# Patient Record
Sex: Male | Born: 1965
Health system: Southern US, Community
[De-identification: ages and names within clinical notes are randomized; demographics above are authoritative.]

## PROBLEM LIST (undated history)

## (undated) DIAGNOSIS — Z87891 Personal history of nicotine dependence: Secondary | ICD-10-CM

## (undated) DIAGNOSIS — I420 Dilated cardiomyopathy: Secondary | ICD-10-CM

## (undated) DIAGNOSIS — I509 Heart failure, unspecified: Secondary | ICD-10-CM

## (undated) DIAGNOSIS — M109 Gout, unspecified: Secondary | ICD-10-CM

## (undated) DIAGNOSIS — I10 Essential (primary) hypertension: Secondary | ICD-10-CM

## (undated) DIAGNOSIS — Z789 Other specified health status: Secondary | ICD-10-CM

## (undated) DIAGNOSIS — F1090 Alcohol use, unspecified, uncomplicated: Secondary | ICD-10-CM

## (undated) DIAGNOSIS — N1831 Chronic kidney disease, stage 3a: Secondary | ICD-10-CM

## (undated) HISTORY — DX: Heart failure, unspecified: I50.9

## (undated) HISTORY — DX: Gout, unspecified: M10.9

## (undated) HISTORY — DX: Essential (primary) hypertension: I10

---

## 2000-12-18 ENCOUNTER — Emergency Department (HOSPITAL_COMMUNITY): Admission: EM | Admit: 2000-12-18 | Discharge: 2000-12-18 | Payer: Self-pay | Admitting: Emergency Medicine

## 2001-12-12 ENCOUNTER — Encounter: Payer: Self-pay | Admitting: Family Medicine

## 2001-12-12 ENCOUNTER — Ambulatory Visit (HOSPITAL_COMMUNITY): Admission: RE | Admit: 2001-12-12 | Discharge: 2001-12-12 | Payer: Self-pay | Admitting: Family Medicine

## 2002-05-17 ENCOUNTER — Encounter: Payer: Self-pay | Admitting: *Deleted

## 2002-05-17 ENCOUNTER — Emergency Department (HOSPITAL_COMMUNITY): Admission: EM | Admit: 2002-05-17 | Discharge: 2002-05-17 | Payer: Self-pay | Admitting: Emergency Medicine

## 2007-05-10 ENCOUNTER — Emergency Department (HOSPITAL_COMMUNITY): Admission: EM | Admit: 2007-05-10 | Discharge: 2007-05-10 | Payer: Self-pay | Admitting: Emergency Medicine

## 2011-02-06 ENCOUNTER — Other Ambulatory Visit: Payer: Self-pay | Admitting: Family Medicine

## 2011-02-06 ENCOUNTER — Other Ambulatory Visit: Payer: Self-pay

## 2011-02-06 DIAGNOSIS — R059 Cough, unspecified: Secondary | ICD-10-CM

## 2011-02-06 DIAGNOSIS — R05 Cough: Secondary | ICD-10-CM

## 2011-02-27 ENCOUNTER — Inpatient Hospital Stay (HOSPITAL_COMMUNITY)
Admission: RE | Admit: 2011-02-27 | Discharge: 2011-03-01 | DRG: 124 | Disposition: A | Discharge status: Home / Self Care | Payer: BC Managed Care – PPO | Source: Ambulatory Visit | Attending: Cardiology | Admitting: Cardiology

## 2011-02-27 DIAGNOSIS — I10 Essential (primary) hypertension: Secondary | ICD-10-CM | POA: Diagnosis present

## 2011-02-27 DIAGNOSIS — I5043 Acute on chronic combined systolic (congestive) and diastolic (congestive) heart failure: Principal | ICD-10-CM | POA: Diagnosis present

## 2011-02-27 DIAGNOSIS — I2789 Other specified pulmonary heart diseases: Secondary | ICD-10-CM | POA: Diagnosis present

## 2011-02-27 DIAGNOSIS — R000 Tachycardia, unspecified: Secondary | ICD-10-CM | POA: Diagnosis present

## 2011-02-27 DIAGNOSIS — I509 Heart failure, unspecified: Secondary | ICD-10-CM | POA: Diagnosis present

## 2011-02-27 DIAGNOSIS — F17200 Nicotine dependence, unspecified, uncomplicated: Secondary | ICD-10-CM | POA: Diagnosis present

## 2011-02-27 DIAGNOSIS — I428 Other cardiomyopathies: Secondary | ICD-10-CM | POA: Diagnosis present

## 2011-02-27 LAB — POCT I-STAT 3, ART BLOOD GAS (G3+)
Acid-base deficit: 1 mmol/L (ref 0.0–2.0)
Bicarbonate: 22.5 mEq/L (ref 20.0–24.0)
O2 Saturation: 95 %
TCO2: 24 mmol/L (ref 0–100)
pCO2 arterial: 35.1 mmHg (ref 35.0–45.0)
pH, Arterial: 7.414 (ref 7.350–7.450)
pO2, Arterial: 76 mmHg — ABNORMAL LOW (ref 80.0–100.0)

## 2011-02-27 LAB — POCT I-STAT 3, VENOUS BLOOD GAS (G3P V)
O2 Saturation: 65 %
TCO2: 27 mmol/L (ref 0–100)
pCO2, Ven: 43.3 mmHg — ABNORMAL LOW (ref 45.0–50.0)
pO2, Ven: 35 mmHg (ref 30.0–45.0)

## 2011-02-28 LAB — CBC
MCH: 30.7 pg (ref 26.0–34.0)
MCHC: 33.6 g/dL (ref 30.0–36.0)
MCV: 91.4 fL (ref 78.0–100.0)
Platelets: 188 10*3/uL (ref 150–400)
RDW: 15.4 % (ref 11.5–15.5)
WBC: 8.7 10*3/uL (ref 4.0–10.5)

## 2011-02-28 LAB — BASIC METABOLIC PANEL
BUN: 16 mg/dL (ref 6–23)
Calcium: 9 mg/dL (ref 8.4–10.5)
Creatinine, Ser: 1.38 mg/dL (ref 0.4–1.5)
GFR calc non Af Amer: 55 mL/min — ABNORMAL LOW (ref >60)

## 2011-02-28 LAB — BRAIN NATRIURETIC PEPTIDE: Pro B Natriuretic peptide (BNP): 1100 pg/mL — ABNORMAL HIGH (ref 0.0–100.0)

## 2011-03-01 LAB — BASIC METABOLIC PANEL
BUN: 16 mg/dL (ref 6–23)
Creatinine, Ser: 1.22 mg/dL (ref 0.4–1.5)
GFR calc non Af Amer: >60 mL/min (ref >60)

## 2011-03-01 LAB — BRAIN NATRIURETIC PEPTIDE: Pro B Natriuretic peptide (BNP): 740 pg/mL — ABNORMAL HIGH (ref 0.0–100.0)

## 2011-03-12 NOTE — Procedures (Signed)
Christopher [name], Christopher [name]                ACCOUNT NO.:  [id number]  MEDICAL RECORD NO.:  [id number]           PATIENT TYPE:  I  LOCATION:  3703                         FACILITY:  [facility]  PHYSICIAN:  Christopher Kotyk R. Jacinto Halim, MD       DATE OF BIRTH:  May 05, 1966  DATE OF PROCEDURE:  02/27/2011 DATE OF DISCHARGE:                           CARDIAC CATHETERIZATION   PROCEDURE PERFORMED: 1. Right heart catheterization. 2. Left heart catheterization including:     a.     Left ventriculography.     b.     Selective right and left coronary angiography.  INDICATIONS:  Christopher [name] is a 45 year old gentleman with history of hypertension, morbid obesity, history of questionable alcohol excessive use, who presents with shortness of breath.  He underwent outpatient stress testing and echocardiographic examination which had revealed restrictive physiology, dilated cardiomyopathy, and no evidence of ischemia with severe LV systolic dysfunction.  Now, he is brought to the cardiac cath lab to evaluate his coronary anatomy.  Right heart catheterization was performed to evaluate for pulmonary hypertension and evaluate his cardiac output and cardiac index.  HEMODYNAMIC DATA:  A.  Right heart catheterization: 1. RA pressure 28/27, mean 25 mmHg. 2. RV pressure 71/20 with end-diastolic pressure of 26 mmHg. 3. PA pressure 75/42 with a mean of 50 mmHg, pulmonary capillary wedge     pressure 48/58 with a mean of 46 mmHg. 4. PA saturation 65% and aortic saturation was 95%. 5. Cardiac output by Fick was 5.44 with a cardiac index of 2.2.     a.     Left heart catheterization:  Left ventricular pressure was      113/28 with an end-diastolic pressure of 42 mmHg.  Aortic pressure      was 111/92 with a mean of 191 msec.  There was no pressure      gradient across the aortic valve.  ANGIOGRAPHIC DATA: 1. Left ventricle:  Left ventricular systolic function was markedly     depressed with ejection fraction of 15% to 20%.  The  left ventricle     was markedly dilated. 2. Right coronary artery:  Right coronary artery was a dominant     vessel.  It is smooth and has got mild luminal irregularity. 3. Left main coronary artery:  Left main coronary artery was a large-     caliber vessel, smooth and normal. 4. Circumflex:  Circumflex was a very large-caliber vessel measuring     at least 6 to 6.5 mm in diameter.  Gives origin to large obtuse     marginals.  There was mild luminal irregularity. 5. LAD:  LAD is a large-caliber vessel giving origin to a large     diagonal-1.  It has got mild luminal irregularity.  IMPRESSION: 1. Findings consistent with nonischemic dilated cardiomyopathy. 2. Severe pulmonary hypertension not reversed with total of 800 mcg of     intrapulmonary arterial administration of nitroglycerin.  RECOMMENDATIONS:  The patient will be admitted to the hospital.  Given the fact that the EDP is extremely high, I suspect due to contrast load he could potentially go into  pulmonary edema.  Diurese him over a period of next 24-48 hours and discharge him at that time.  Continued risk modification including total cessation of alcohol intake, smoking cessation, fluid and salt restriction is indicated.  Weight loss is paramount.  A total of 35 mL of contrast was utilized for diagnostic angiography.  TECHNIQUE OF PROCEDURE:  Under sterile precautions using a 6-French right radial access and a 5-French right antecubital vein access, left and right heart catheterization was performed.  Left heart catheterization was performed using a TIG-4 catheter which was advanced into the left ventricle over a J-wire.  Left ventriculography was performed in the RAO projection using 10 mL of contrast.  The catheter then pulled into the ascending aorta.  Left main coronary was selectively engaged, and angiography was performed.  Then, the right coronary artery was selectively engaged and angiography was performed.   Catheter then pulled out of the body over exchange length J- wire.  Right heart catheterization was performed using a 5-French Burman balloon-tipped Swan-Ganz catheter which was advanced into the pulmonary artery and into the pulmonary capillary wedge position.  Right-sided hemodynamics were carefully performed and data was carefully analyzed. Cardiac output as calculated by Fick.  The catheter was then pulled out of the body in the usual fashion.  The patient tolerated the procedure well.  Hemostasis was obtained by applying TR band at the right radial artery site and manual pressure at the antecubital vein access site.  No immediate complication noted.     Christopher Hilts. Jacinto Halim, MD     JRG/MEDQ  D:  02/27/2011  T:  02/28/2011  Job:  [id number]  cc:   Christopher [name], M.D.  Electronically Signed by Christopher Decamp MD on 03/12/2011 09:51:52 AM

## 2011-03-12 NOTE — Discharge Summary (Signed)
  [name], Christopher [name]                ACCOUNT NO.:  [id number]  MEDICAL RECORD NO.:  [id number]           PATIENT TYPE:  I  LOCATION:  3703                         FACILITY:  [facility]  PHYSICIAN:  Vonna Kotyk R. Jacinto Halim, MD       DATE OF BIRTH:  1966-11-27  DATE OF ADMISSION:  02/27/2011 DATE OF DISCHARGE:  03/01/2011                              DISCHARGE SUMMARY   DISCHARGE DIAGNOSES: 1. Acute on chronic systolic and diastolic heart failure. 2. Nonischemic dilated cardiomyopathy. 3. Severe pulmonary hypertension. 4. Systemic hypertension. 5. Morbid obesity.  DISCHARGE MEDICATIONS: 1. Diovan/HCT 160/12.5 half tablet p.o. daily. 2. Multivitamin p.o. daily. 3. Nexium 40 mg p.o. daily. 4. Digoxin 0.125 mg p.o. daily. 5. Coreg 3.125 mg p.o. daily. 6. Lasix 20 mg p.o. daily. 7. K-Dur 20 mEq p.o. daily. 8. The patient is on sleeping pill at bedtime p.r.n., that will be     continued.  BRIEF HISTORY:  Mr. Christopher [name] is a 44-year gentleman with history of hypertension, morbid obesity, and questionable alcohol excessive use who was seen for shortness of breath.  He underwent outpatient echocardiogram and stress testing which had revealed presence of severe LV systolic dysfunction.  He was then electively brought for cardiac catheterization on February 27, 2011.  Left and right heart catheterization was performed.  PROCEDURAL DATA:  His ejection fraction was severely depressed with ejection fraction around 15% to 20% with dilated left ventricle.  He had mild luminal irregularity in his coronary arteries without any significant coronary artery disease.  He had severe pulmonary hypertension with PA pressure of 75/42 mmHg with a mean of 50 mmHg.  His pulmonary capillary wedge was markedly elevated with a mean of 46 mmHg.  His cardiac output and cardiac index were preserved.  COURSE IN THE HOSPITAL:  Because of his markedly elevated pulmonary hypertension and also wedge pressures, the fact that  he has also received contrast, suspicion for acute worsening of heart failure was suspected.  Hence he was admitted after the cardiac catheterization for diuresis.  With IV Lasix, he diuresed significantly.  He had a total loss of 3600 mL of fluid.  His BNP improved from 1100 yesterday to discharge of 740. He had tachycardia on admission, which also resolved.  Hence, on the day of discharge, he was felt to be stable for discharge.  Again, I have discussed with the patient regarding smoking cessation and alcohol abstinence.  I will see him back in the office in 1 week to 10 days.  NEW MEDICATIONS: 1. Digoxin 0.25 mg p.o. daily. 2. Lasix 20 mg p.o. daily. 3. Potassium 20 mEq p.o. daily. 4. His Diovan and Coreg dose has been decreased.     Cristy Hilts. Jacinto Halim, MD     JRG/MEDQ  D:  03/01/2011  T:  03/02/2011  Job:  [id number]  cc:   Thora Lance, M.D.  Electronically Signed by Yates Decamp MD on 03/12/2011 09:51:44 AM

## 2017-05-28 ENCOUNTER — Encounter: Payer: Self-pay | Admitting: Neurology

## 2017-05-28 DIAGNOSIS — M79659 Pain in unspecified thigh: Secondary | ICD-10-CM | POA: Diagnosis not present

## 2017-05-29 ENCOUNTER — Other Ambulatory Visit: Payer: Self-pay | Admitting: *Deleted

## 2017-05-29 DIAGNOSIS — M79651 Pain in right thigh: Secondary | ICD-10-CM

## 2017-06-13 ENCOUNTER — Ambulatory Visit (INDEPENDENT_AMBULATORY_CARE_PROVIDER_SITE_OTHER): Payer: 59 | Admitting: Neurology

## 2017-06-13 DIAGNOSIS — G5711 Meralgia paresthetica, right lower limb: Secondary | ICD-10-CM

## 2017-06-13 DIAGNOSIS — M79651 Pain in right thigh: Secondary | ICD-10-CM | POA: Diagnosis not present

## 2017-06-13 NOTE — Procedures (Signed)
[facility]  [address], [address]  [place], [place] [address] Tel: [phone] Fax:  [phone] Test Date:  06/13/2017  Patient: Christopher ArmsCraig Cercone DOB: [birth year] Physician: Nita Sickleonika Patel, DO  Sex: Male Height: 6' " Ref Phys: Georgann HousekeeperKarrar Husain, MD  ID#: [id number] Temp: 38.1C Technician:    Patient Complaints: This is a 51 year-old man referred for evaluation of right thigh pain and paresthesias.  NCV & EMG Findings: Extensive electrodiagnostic testing of the right lower extremity and additional studies of the left is somewhat hampered by technical limitations due to body physiognomy. Findings are as follows:  1. Nerve conduction studies of the lateral cutaneous sensory response is technically challenging and absent bilaterally. Right sural and superficial peroneal sensory responses are within normal limits. 2. Right tibial motor response at the ankle is within normal limits, however, stimulation at the popliteal fossa was limited due to body habitus.  3. There is no evidence of active or chronic motor axon loss changes affecting any of the tested muscles.  Impression: Given that the lateral femoral cutaneous sensory responses are absent bilaterally and patient is only symptomatic on the right side, these findings may be due to technical limitations due to body physiognomy; with that said, based on patient's clinical presentation, meralgia paresthetica is highly suspected. Correlate clinically.  There is no evidence of a lumbosacral radiculopathy or sensorimotor polyneuropathy affecting the right lower extremity.    ___________________________ Nita Sickleonika Patel, DO    Nerve Conduction Studies Anti Sensory Summary Table   Site NR Peak (ms) Norm Peak (ms) P-T Amp (V) Norm P-T Amp  Left Lat Femoral Cutan Anti Sensory (Lateral Thigh)  ASIS NR  <3.1  >10  Right Lat Femoral Cutan Anti Sensory (Lateral Thigh)  ASIS NR  <3.1  >10  Right Sup Peroneal Anti Sensory (Ant Lat Mall)    12 cm    2.9 <4.6 7.4 >4  Right Sural Anti Sensory (Lat Mall)  Calf    3.4 <4.6 7.4 >4   Motor Summary Table   Site NR Onset (ms) Norm Onset (ms) O-P Amp (mV) Norm O-P Amp Site1 Site2 Delta-0 (ms) Dist (cm) Vel (m/s) Norm Vel (m/s)  Right Peroneal Motor (Ext Dig Brev)  Ankle    2.5 <6.0 9.1 >2.5 B Fib Ankle 7.4 35.0 47 >40  B Fib    9.9  8.5  Poplt B Fib 1.5 10.0 67 >40  Poplt    11.4  7.9         Right Tibial Motor (Abd Hall Brev)    body habitus behind knee  Ankle    4.6 <6.0 5.3 >4 Knee Ankle 4.4 37.0 84 >40  Knee    9.0  1.0          EMG   Side Muscle Ins Act Fibs Psw Fasc Number Recrt Dur Dur. Amp Amp. Poly Poly. Comment  Right AntTibialis Nml Nml Nml Nml Nml Nml Nml Nml Nml Nml Nml Nml N/A  Right Gastroc Nml Nml Nml Nml Nml Nml Nml Nml Nml Nml Nml Nml N/A  Right RectFemoris Nml Nml Nml Nml Nml Nml Nml Nml Nml Nml Nml Nml N/A  Right VastusLat Nml Nml Nml Nml Nml Nml Nml Nml Nml Nml Nml Nml N/A  Right GluteusMed Nml Nml Nml Nml Nml Nml Nml Nml Nml Nml Nml Nml N/A      Waveforms:

## 2017-07-31 MED FILL — ALLOPURINOL 100 MG TABLET: 100 | 60 days supply | Qty: 180 | Fill #0

## 2017-07-31 MED FILL — VALSARTAN-HCTZ 320-12.5 MG: 320-12.5 | 30 days supply | Qty: 30 | Fill #0

## 2017-07-31 MED FILL — BYSTOLIC 10 MG TABLET: 10 | 60 days supply | Qty: 120 | Fill #0

## 2017-08-26 MED FILL — VALSARTAN-HCTZ 320-12.5 MG: 320-12.5 | 30 days supply | Qty: 30 | Fill #1

## 2017-08-26 MED FILL — SPIRONOLACTONE 25 MG TABLET: 25 | 60 days supply | Qty: 60 | Fill #0

## 2017-08-29 MED FILL — SILDENAFIL 20 MG TABLET: 20 | 10 days supply | Qty: 50 | Fill #0

## 2017-09-25 MED FILL — ALLOPURINOL 300 MG TABLET: 300 | 90 days supply | Qty: 90 | Fill #0

## 2017-09-25 MED FILL — VALSARTAN-HCTZ 320-12.5 MG: 320-12.5 | 30 days supply | Qty: 30 | Fill #2

## 2017-09-25 MED FILL — BYSTOLIC 10 MG TABLET: 10 | 90 days supply | Qty: 180 | Fill #0

## 2017-10-18 MED FILL — SPIRONOLACTONE 25 MG TABLET: 25 | 90 days supply | Qty: 90 | Fill #0

## 2017-10-21 MED FILL — VALSARTAN-HCTZ 320-12.5 MG: 320-12.5 | 90 days supply | Qty: 90 | Fill #0

## 2017-10-21 MED FILL — SILDENAFIL 20 MG TABLET: 20 | 10 days supply | Qty: 50 | Fill #1

## 2017-12-30 MED FILL — ALLOPURINOL 300 MG TABLET: 300 | 90 days supply | Qty: 90 | Fill #1

## 2017-12-30 MED FILL — BYSTOLIC 20 MG TABLET: 20 | 90 days supply | Qty: 90 | Fill #0

## 2018-01-08 MED FILL — SPIRONOLACTONE 25 MG TABLET: 25 | 90 days supply | Qty: 90 | Fill #1

## 2018-01-08 MED FILL — VALSARTAN-HCTZ 320-12.5 MG: 320-12.5 | 90 days supply | Qty: 90 | Fill #1

## 2018-03-24 MED FILL — VALSARTAN-HCTZ 320-12.5 MG: 320-12.5 | 90 days supply | Qty: 90 | Fill #2

## 2018-03-24 MED FILL — ALLOPURINOL 300 MG TABLET: 300 | 90 days supply | Qty: 90 | Fill #2

## 2018-03-24 MED FILL — SPIRONOLACTONE 25 MG TABLET: 25 | 90 days supply | Qty: 90 | Fill #2

## 2018-03-24 MED FILL — BYSTOLIC 20 MG TABLET: 20 | 90 days supply | Qty: 90 | Fill #1

## 2018-06-25 MED FILL — VALSARTAN-HCTZ 320-12.5 MG: 320-12.5 | 90 days supply | Qty: 90 | Fill #3

## 2018-06-25 MED FILL — SPIRONOLACTONE 25 MG TABLET: 25 | 90 days supply | Qty: 90 | Fill #3

## 2018-06-25 MED FILL — ALLOPURINOL 300 MG TABLET: 300 | 90 days supply | Qty: 90 | Fill #3

## 2018-06-25 MED FILL — BYSTOLIC 20 MG TABLET: 20 | 90 days supply | Qty: 90 | Fill #2

## 2018-09-04 ENCOUNTER — Ambulatory Visit
Admission: RE | Admit: 2018-09-04 | Discharge: 2018-09-04 | Disposition: A | Discharge status: Home / Self Care | Payer: 59 | Source: Ambulatory Visit | Attending: Internal Medicine | Admitting: Internal Medicine

## 2018-09-04 ENCOUNTER — Other Ambulatory Visit: Payer: Self-pay | Admitting: Internal Medicine

## 2018-09-04 DIAGNOSIS — I509 Heart failure, unspecified: Secondary | ICD-10-CM

## 2018-09-04 DIAGNOSIS — J9811 Atelectasis: Secondary | ICD-10-CM | POA: Diagnosis not present

## 2018-09-04 MED FILL — POTASSIUM CHLORIDE CRYS ER: 10 | 30 days supply | Qty: 60 | Fill #0

## 2018-09-04 MED FILL — FUROSEMIDE 40 MG TAB: 40 | 30 days supply | Qty: 120 | Fill #0

## 2018-09-08 DIAGNOSIS — I10 Essential (primary) hypertension: Secondary | ICD-10-CM | POA: Diagnosis not present

## 2018-09-08 DIAGNOSIS — I5043 Acute on chronic combined systolic (congestive) and diastolic (congestive) heart failure: Secondary | ICD-10-CM | POA: Diagnosis not present

## 2018-09-08 DIAGNOSIS — I420 Dilated cardiomyopathy: Secondary | ICD-10-CM | POA: Diagnosis not present

## 2018-09-08 DIAGNOSIS — F1010 Alcohol abuse, uncomplicated: Secondary | ICD-10-CM | POA: Diagnosis not present

## 2018-09-09 DIAGNOSIS — I509 Heart failure, unspecified: Secondary | ICD-10-CM | POA: Diagnosis not present

## 2018-09-10 DIAGNOSIS — I5043 Acute on chronic combined systolic (congestive) and diastolic (congestive) heart failure: Secondary | ICD-10-CM | POA: Diagnosis not present

## 2018-09-10 DIAGNOSIS — I420 Dilated cardiomyopathy: Secondary | ICD-10-CM | POA: Diagnosis not present

## 2018-09-10 DIAGNOSIS — I10 Essential (primary) hypertension: Secondary | ICD-10-CM | POA: Diagnosis not present

## 2018-09-10 DIAGNOSIS — F1010 Alcohol abuse, uncomplicated: Secondary | ICD-10-CM | POA: Diagnosis not present

## 2018-09-10 MED FILL — CARVEDILOL 25 MG TABLET: 25 | 30 days supply | Qty: 60 | Fill #0

## 2018-09-11 MED FILL — ALLOPURINOL 300 MG TABS: 300 | 90 days supply | Qty: 90 | Fill #0

## 2018-10-08 MED FILL — POTASSIUM CHLORIDE CRYS ER: 10 | 90 days supply | Qty: 180 | Fill #0

## 2018-10-08 MED FILL — FUROSEMIDE 40 MG TAB: 40 | 30 days supply | Qty: 120 | Fill #1

## 2018-10-08 MED FILL — CARVEDILOL 25 MG TABLET: 25 | 30 days supply | Qty: 60 | Fill #1

## 2018-10-28 DIAGNOSIS — M109 Gout, unspecified: Secondary | ICD-10-CM | POA: Diagnosis not present

## 2018-10-28 DIAGNOSIS — I420 Dilated cardiomyopathy: Secondary | ICD-10-CM | POA: Diagnosis not present

## 2018-10-28 DIAGNOSIS — I5040 Unspecified combined systolic (congestive) and diastolic (congestive) heart failure: Secondary | ICD-10-CM | POA: Diagnosis not present

## 2018-10-29 MED FILL — SPIRONOLACTONE 25 MG TABLET: 25 | 90 days supply | Qty: 90 | Fill #0

## 2018-10-29 MED FILL — ENTRESTO 97 MG-103 MG TAB: 97-103 | 30 days supply | Qty: 60 | Fill #0

## 2018-11-20 MED FILL — FUROSEMIDE 40 MG TAB: 40 | 30 days supply | Qty: 120 | Fill #2

## 2018-12-08 MED FILL — ALLOPURINOL 300 MG TABS: 300 | 90 days supply | Qty: 90 | Fill #1

## 2018-12-08 MED FILL — CARVEDILOL 25 MG TABLET: 25 | 30 days supply | Qty: 60 | Fill #0

## 2018-12-08 MED FILL — ENTRESTO 97 MG-103 MG TAB: 97-103 | 30 days supply | Qty: 60 | Fill #0

## 2019-01-09 MED FILL — FUROSEMIDE 40 MG TAB: 40 | 30 days supply | Qty: 120 | Fill #3

## 2019-01-09 MED FILL — SPIRONOLACTONE 25 MG TABLET: 25 | 90 days supply | Qty: 90 | Fill #1

## 2019-01-20 MED FILL — CARVEDILOL 25 MG TABLET: 25 | 30 days supply | Qty: 60 | Fill #1

## 2019-01-30 MED FILL — ENTRESTO 97 MG-103 MG TAB: 97-103 | 30 days supply | Qty: 60 | Fill #1

## 2019-02-12 MED FILL — POTASSIUM CHLORIDE CRYS ER: 10 | 90 days supply | Qty: 180 | Fill #1

## 2019-03-16 MED FILL — ALLOPURINOL 300 MG TABS: 300 | 90 days supply | Qty: 90 | Fill #0

## 2019-03-16 MED FILL — FUROSEMIDE 40 MG TAB: 40 | 90 days supply | Qty: 180 | Fill #0

## 2019-03-16 MED FILL — CARVEDILOL 25 MG TABLET: 25 | 90 days supply | Qty: 180 | Fill #0

## 2019-03-16 MED FILL — ENTRESTO 97 MG-103 MG TAB: 97-103 | 90 days supply | Qty: 180 | Fill #0

## 2019-03-31 MED FILL — SPIRONOLACTONE 25 MG TABS: 25 | 90 days supply | Qty: 90 | Fill #0

## 2019-05-01 DIAGNOSIS — R0600 Dyspnea, unspecified: Secondary | ICD-10-CM | POA: Diagnosis not present

## 2019-05-01 DIAGNOSIS — R609 Edema, unspecified: Secondary | ICD-10-CM | POA: Diagnosis not present

## 2019-05-06 ENCOUNTER — Ambulatory Visit (INDEPENDENT_AMBULATORY_CARE_PROVIDER_SITE_OTHER): Payer: 59 | Admitting: Cardiology

## 2019-05-06 ENCOUNTER — Encounter: Payer: Self-pay | Admitting: Cardiology

## 2019-05-06 ENCOUNTER — Other Ambulatory Visit: Payer: Self-pay

## 2019-05-06 DIAGNOSIS — Z6841 Body Mass Index (BMI) 40.0 and over, adult: Secondary | ICD-10-CM

## 2019-05-06 DIAGNOSIS — E669 Obesity, unspecified: Secondary | ICD-10-CM | POA: Insufficient documentation

## 2019-05-06 DIAGNOSIS — I426 Alcoholic cardiomyopathy: Secondary | ICD-10-CM | POA: Diagnosis not present

## 2019-05-06 DIAGNOSIS — I10 Essential (primary) hypertension: Secondary | ICD-10-CM | POA: Diagnosis not present

## 2019-05-06 DIAGNOSIS — I5043 Acute on chronic combined systolic (congestive) and diastolic (congestive) heart failure: Secondary | ICD-10-CM

## 2019-05-06 DIAGNOSIS — R945 Abnormal results of liver function studies: Secondary | ICD-10-CM | POA: Diagnosis not present

## 2019-05-06 DIAGNOSIS — I5023 Acute on chronic systolic (congestive) heart failure: Secondary | ICD-10-CM | POA: Insufficient documentation

## 2019-05-06 MED ORDER — TORSEMIDE 20 MG PO TABS: 20.0000 mg | ORAL_TABLET | Freq: Two times a day (BID) | ORAL | 2 refills | Status: DC

## 2019-05-06 MED FILL — TORSEMIDE 20 MG TABLET: 20 | 30 days supply | Qty: 60 | Fill #0

## 2019-05-06 NOTE — Progress Notes (Signed)
Primary Physician/Referring:  Kirby Funk, MD  Patient ID: Christopher [name], male    DOB: Mar 26, 1966, 53 y.o.   MRN: [id number]  Chief Complaint  Patient presents with  . Acute Visit  . Congestive Heart Failure    w/ aspiration    HPI: Christopher [name]  is a 53 y.o.  [name] with  hypertension, hyperlipidemia, morbid obesity, alcohol use, history of polysubstance abuse, chronic systolic and diastolic heart failure, dilated cardiomyopathy, and severe pulmonary hypertension by right heart and LV angiogram in March 2012.  He does continue to drink alcohol and feels that he should cut back, Started drinking heavy 2 months ago and over the past 2 weeks has noticed worsening dyspnea and leg edema. He has remained abstinent from drug use for > 20 years.  Past Medical History:  Diagnosis Date  . CHF (congestive heart failure) (HCC)   . Gout   . Hypertension     History reviewed. No pertinent surgical history.  Social History   Socioeconomic History  . Marital status: Single    Spouse name: Not on file  . Number of children: 1  . Years of education: Not on file  . Highest education level: Not on file  Occupational History  . Not on file  Social Needs  . Financial resource strain: Not on file  . Food insecurity:    Worry: Not on file    Inability: Not on file  . Transportation needs:    Medical: Not on file    Non-medical: Not on file  Tobacco Use  . Smoking status: Former Smoker    Packs/day: 0.25    Years: 4.00    Pack years: 1.00    Types: Cigarettes    Last attempt to quit: 05/05/2009    Years since quitting: 10.0  . Smokeless tobacco: Never Used  Substance and Sexual Activity  . Alcohol use: Yes    Comment: beer qod  . Drug use: Not on file  . Sexual activity: Not on file  Lifestyle  . Physical activity:    Days per week: Not on file    Minutes per session: Not on file  . Stress: Not on file  Relationships  . Social connections:    Talks on phone: Not on file   Gets together: Not on file    Attends religious service: Not on file    Active member of club or organization: Not on file    Attends meetings of clubs or organizations: Not on file    Relationship status: Not on file  . Intimate partner violence:    Fear of current or ex partner: Not on file    Emotionally abused: Not on file    Physically abused: Not on file    Forced sexual activity: Not on file  Other Topics Concern  . Not on file  Social History Narrative  . Not on file    Review of Systems  Constitution: Positive for malaise/fatigue. Negative for chills, decreased appetite and weight gain.  Cardiovascular: Positive for dyspnea on exertion and leg swelling. Negative for syncope.  Endocrine: Negative for cold intolerance.  Hematologic/Lymphatic: Does not bruise/bleed easily.  Musculoskeletal: Negative for joint swelling.  Gastrointestinal: Negative for abdominal pain, anorexia, change in bowel habit, hematochezia and melena.  Neurological: Negative for headaches and light-headedness.  Psychiatric/Behavioral: Negative for depression and substance abuse.  All other systems reviewed and are negative.     Objective  Blood pressure 128/88, pulse 88, temperature (!)  97.1 F (36.2 C), height  (1.753 m), weight (!) 321 lb 9.6 oz (145.9 kg), SpO2 97 %. Body mass index is 47.49 kg/m.    Physical Exam  Constitutional: He appears well-developed. No distress.  Morbidly obese  HENT:  Head: Atraumatic.  Eyes: Conjunctivae are normal.  Neck: Hepatojugular reflux and JVD present. No thyromegaly present.  Short neck and difficult to evaluate JVP  Cardiovascular: Normal rate and regular rhythm. Exam reveals no gallop.  Murmur heard. High-pitched blowing holosystolic murmur is present with a grade of 3/6 at the lower left sternal border and apex. Pulses:      Carotid pulses are 2+ on the right side and 2+ on the left side.      Femoral pulses are 2+ on the right side and 2+ on the  left side.      Dorsalis pedis pulses are 1+ on the right side and 1+ on the left side.       Posterior tibial pulses are 0 on the right side and 0 on the left side.  Femoral and popliteal pulse difficult to feel due to patient's body habitus. 2 plus leg edema, pitting  Pulmonary/Chest: Effort normal and breath sounds normal.  Abdominal: Soft. Bowel sounds are normal.  Obese. Pannus present  Musculoskeletal: Normal range of motion.        General: No edema.  Neurological: He is alert.  Skin: Skin is warm and dry.  Psychiatric: He has a normal mood and affect.   Radiology: No results found.  Laboratory examination:    CMP Latest Ref Rng & Units 03/01/2011 02/28/2011  Glucose 70 - 99 mg/dL 87 96  BUN 6 - 23 mg/dL 16 16  Creatinine 0.4 - 1.5 mg/dL 1.61 0.96  Sodium 045 - 145 mEq/L 140 146(H)  Potassium 3.5 - 5.1 mEq/L 3.3(L) 4.1  Chloride 96 - 112 mEq/L 105 106  CO2 19 - 32 mEq/L 28 31  Calcium 8.4 - 10.5 mg/dL 8.7 9.0   CBC Latest Ref Rng & Units 02/28/2011  WBC 4.0 - 10.5 K/uL 8.7  Hemoglobin 13.0 - 17.0 g/dL 40.9  Hematocrit 81.1 - 52.0 % 44.6  Platelets 150 - 400 K/uL 188   Lipid Panel  No results found for: CHOL, TRIG, HDL, CHOLHDL, VLDL, LDLCALC, LDLDIRECT HEMOGLOBIN A1C No results found for: HGBA1C, MPG TSH No results for input(s): TSH in the last 8760 hours.  PRN Meds:. Medications Discontinued During This Encounter  Medication Reason  . furosemide (LASIX) 40 MG tablet Change in therapy   Current Meds  Medication Sig  . allopurinol (ZYLOPRIM) 300 MG tablet daily.  Marland Kitchen aspirin EC 81 MG tablet Take 81 mg by mouth daily.  . carvedilol (COREG) 25 MG tablet Take 1 tablet by mouth 2 (two) times a day.  Marland Kitchen ENTRESTO 97-103 MG Take 1 tablet by mouth 2 (two) times a day.  . potassium chloride (K-DUR) 10 MEQ tablet daily.  Marland Kitchen spironolactone (ALDACTONE) 25 MG tablet daily.  . [DISCONTINUED] furosemide (LASIX) 40 MG tablet Take 80 mg by mouth daily.    Cardiac Studies:     Echocardiogram 09/09/2018: Left ventricle cavity is severely dilated. Mild concentric hypertrophy of the left ventricle. Severe decrease in global wall motion. Doppler evidence of grade III (restrictive) diastolic dysfunction, elevated LAP. Calculated EF 18%. Left atrial cavity is severely dilated. Right ventricle cavity is mildly dilated. Mildly reduced right ventricular function. Mild (Grade I) aortic regurgitation. Mild (Grade I) mitral regurgitation. Moderate tricuspid regurgitation. Estimated pulmonary  artery systolic pressure 50 mmHg. Mild pulmonic regurgitation. IVC is dilated with respiratory variation. Estimated RA pressure 10-15 mmHg.  Right and left heart catheterization 02/27/2011: Findings consistent with nonischemic dilated cardiomyopathy. Severe pulmonary hypertension not reverse but total of 800 micrograms of intra-pulmonary arterial administration of nitroglycerin. Mean PA pressure 50 mmHg.  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 LVH. LAE. ST-T changes seconday to LVH.  Recommendations:   Patient referred back to me on an urgent basis for evaluation of worsening dyspnea and leg edema, patient is presently in acute decompensated heart failure.  Patient has been eating out pretty much every day since COVID 19, has also been drinking excessively about 4-5 beers along with hard liquor.  Suspect etiology for his heart failure is noncompliance with his diet.  I have discontinued furosemide and switched him to torsemide.  He is also taking Coreg at q. Day dosing and and to start q. Day dosing, will increase it to  b.i.d. dosing.  He just had his labs done this morning and I'll follow-up on this from his PCP.  Extensive counseling regarding abstinence from alcohol and also making diet changes.  I'd like to see him back in 2 weeks.  He has a very loud pansystolic murmur at the apex and left parasternal border, I'll repeat echocardiogram.  Yates Decamp, MD, [facility] 05/07/2019, 4:44 AM [facility] [place] Pager: [phone] Office: [phone] If no answer Cell [phone]

## 2019-05-20 ENCOUNTER — Encounter: Payer: Self-pay | Admitting: Cardiology

## 2019-05-20 ENCOUNTER — Other Ambulatory Visit: Payer: Self-pay

## 2019-05-20 ENCOUNTER — Ambulatory Visit: Payer: 59 | Admitting: Cardiology

## 2019-05-20 VITALS — BP 117/91 | HR 92 | Ht 69.0 in | Wt 298.5 lb

## 2019-05-20 DIAGNOSIS — Z6841 Body Mass Index (BMI) 40.0 and over, adult: Secondary | ICD-10-CM

## 2019-05-20 DIAGNOSIS — I426 Alcoholic cardiomyopathy: Secondary | ICD-10-CM

## 2019-05-20 DIAGNOSIS — I5043 Acute on chronic combined systolic (congestive) and diastolic (congestive) heart failure: Secondary | ICD-10-CM | POA: Diagnosis not present

## 2019-05-20 DIAGNOSIS — I10 Essential (primary) hypertension: Secondary | ICD-10-CM | POA: Diagnosis not present

## 2019-05-20 NOTE — Progress Notes (Signed)
Primary Physician/Referring:  Lavone Orn, MD  Patient ID: Christopher [name], male    DOB: 01-21-1966, 53 y.o.   MRN: [id number]  Chief Complaint  Patient presents with  . Congestive Heart Failure  . Hypertension  . Follow-up    HPI: Christopher [name]  is a 53 y.o.  [name] with  hypertension, hyperlipidemia, morbid obesity, alcohol use, history of polysubstance abuse, chronic systolic and diastolic heart failure, dilated cardiomyopathy, and severe pulmonary hypertension by right heart and LV angiogram in March 2012.  Patient was seen 2 weeks ago for acute on chronic heart failure exacerbation related to poor dietary compliance and increased alcohol use.  Lasix was changed to torsemide and at the time he was taking Coreg daily, which was changed to twice daily dosing.  He now presents for follow-up.  He is feeling some better since last seen; however, still having leg swelling and shortness of breath. Tolerating medications well. No chest pain.   He has remained abstinent from drug use for > 20 years.  Past Medical History:  Diagnosis Date  . CHF (congestive heart failure) ([place])   . Gout   . Hypertension     History reviewed. No pertinent surgical history.  Social History   Socioeconomic History  . Marital status: Single    Spouse name: Not on file  . Number of children: 1  . Years of education: Not on file  . Highest education level: Not on file  Occupational History  . Not on file  Social Needs  . Financial resource strain: Not on file  . Food insecurity:    Worry: Not on file    Inability: Not on file  . Transportation needs:    Medical: Not on file    Non-medical: Not on file  Tobacco Use  . Smoking status: Former Smoker    Packs/day: 0.25    Years: 4.00    Pack years: 1.00    Types: Cigarettes    Last attempt to quit: 05/05/2009    Years since quitting: 10.0  . Smokeless tobacco: Never Used  Substance and Sexual Activity  . Alcohol use: Yes    Comment: beer qod   . Drug use: Not Currently  . Sexual activity: Not on file  Lifestyle  . Physical activity:    Days per week: Not on file    Minutes per session: Not on file  . Stress: Not on file  Relationships  . Social connections:    Talks on phone: Not on file    Gets together: Not on file    Attends religious service: Not on file    Active member of club or organization: Not on file    Attends meetings of clubs or organizations: Not on file    Relationship status: Not on file  . Intimate partner violence:    Fear of current or ex partner: Not on file    Emotionally abused: Not on file    Physically abused: Not on file    Forced sexual activity: Not on file  Other Topics Concern  . Not on file  Social History Narrative  . Not on file    Review of Systems  Constitution: Positive for malaise/fatigue. Negative for chills, decreased appetite and weight gain.  Cardiovascular: Positive for dyspnea on exertion and leg swelling. Negative for syncope.  Endocrine: Negative for cold intolerance.  Hematologic/Lymphatic: Does not bruise/bleed easily.  Musculoskeletal: Negative for joint swelling.  Gastrointestinal: Negative for abdominal pain, anorexia, change  in bowel habit, hematochezia and melena.  Neurological: Negative for headaches and light-headedness.  Psychiatric/Behavioral: Negative for depression and substance abuse.  All other systems reviewed and are negative.     Objective  Blood pressure (!) 117/91, pulse 92, height '5\' 9"'  (1.753 m), weight 298 lb 8 oz (135.4 kg), SpO2 98 %. Body mass index is 44.08 kg/m.    Physical Exam  Constitutional: He is oriented to person, place, and time. He appears well-developed. No distress.  Morbidly obese  HENT:  Head: Atraumatic.  Eyes: Conjunctivae are normal.  Neck: Hepatojugular reflux and JVD present. No thyromegaly present.  Short neck and difficult to evaluate JVP  Cardiovascular: Normal rate and regular rhythm. Exam reveals no gallop.   Murmur heard. High-pitched blowing holosystolic murmur is present with a grade of 3/6 at the lower left sternal border and apex. Pulses:      Carotid pulses are 2+ on the right side and 2+ on the left side.      Femoral pulses are 2+ on the right side and 2+ on the left side.      Dorsalis pedis pulses are 1+ on the right side and 1+ on the left side.       Posterior tibial pulses are 0 on the right side and 0 on the left side.  Femoral and popliteal pulse difficult to feel due to patient's body habitus. 2 plus leg edema, pitting  Pulmonary/Chest: Effort normal and breath sounds normal.  Abdominal: Soft. Bowel sounds are normal.  Obese. Pannus present  Musculoskeletal: Normal range of motion.        General: No edema.  Neurological: He is alert and oriented to person, place, and time.  Skin: Skin is warm and dry.  Psychiatric: He has a normal mood and affect.  Vitals reviewed.  Radiology: No results found.  Laboratory examination:   05/06/2019: BNP 2610.  HB 15.7/HCT 47.6, microcytic indicis, platelets 133.  BUN 32, creatinine 1.62, eGFR 54 mL, serum glucose 90 mg, potassium 3.5.  Total bilirubin 3.1, ALP 12/27/25 elevated.  LFTs normal otherwise.  PRN Meds:. There are no discontinued medications. Current Meds  Medication Sig  . allopurinol (ZYLOPRIM) 300 MG tablet daily.  Marland Kitchen aspirin EC 81 MG tablet Take 81 mg by mouth daily.  . carvedilol (COREG) 25 MG tablet Take 1 tablet by mouth 2 (two) times a day.  Marland Kitchen ENTRESTO 97-103 MG Take 1 tablet by mouth 2 (two) times a day.  . potassium chloride (K-DUR) 10 MEQ tablet daily.  Marland Kitchen spironolactone (ALDACTONE) 25 MG tablet daily.  Marland Kitchen torsemide (DEMADEX) 20 MG tablet Take 1 tablet (20 mg total) by mouth 2 (two) times daily at 10 am and 4 pm.    Cardiac Studies:    Echocardiogram 09/09/2018: Left ventricle cavity is severely dilated. Mild concentric hypertrophy of the left ventricle. Severe decrease in global wall motion. Doppler evidence of  grade III (restrictive) diastolic dysfunction, elevated LAP. Calculated EF 18%. Left atrial cavity is severely dilated. Right ventricle cavity is mildly dilated. Mildly reduced right ventricular function. Mild (Grade I) aortic regurgitation. Mild (Grade I) mitral regurgitation. Moderate tricuspid regurgitation. Estimated pulmonary artery systolic pressure 50 mmHg. Mild pulmonic regurgitation. IVC is dilated with respiratory variation. Estimated RA pressure 10-15 mmHg.  Right and left heart catheterization 02/27/2011: Findings consistent with nonischemic dilated cardiomyopathy. Severe pulmonary hypertension not reverse but total of 800 micrograms of intra-pulmonary arterial administration of nitroglycerin. Mean PA pressure 50 mmHg.  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 Abnormal EKG.    Recommendations:   Patient has had 20 pound weight loss since last seen by [place] as well as some improvement in dyspnea on exertion and leg edema.  Continues to not be at his baseline.  We will continue with current medications and hopefully will continue to improve with time and continue lifestyle modifications.  He has cut back on eating out, I have reinforced the importance on not eating out at all especially until he is out of acute heart failure exacerbation to lower his sodium intake.  Blood pressure has remained stable.  Echocardiogram was ordered at his last office visit, but has yet to be performed.  Hopefully can be done in the next few weeks.  Labs from PCP  office in May were reviewed, BNP was markedly elevated at 2600.  He is also noted to have macrocytosis, low platelet count, and abnormal liver enzymes likely secondary to alcohol use.  He does report drinking beer every other day, I have advised him that he should not have any alcohol intake. I will repeat BMP and BNP today for surveillance.  Would like to see him back in 10 days again for continued close observation.  Will provide work excuse for the next 1 week as I feel that he should remain out of work for now.

## 2019-05-21 ENCOUNTER — Encounter: Payer: Self-pay | Admitting: Cardiology

## 2019-05-21 LAB — BASIC METABOLIC PANEL
BUN/Creatinine Ratio: 19 (ref 9–20)
BUN: 32 mg/dL — ABNORMAL HIGH (ref 6–24)
CO2: 24 mmol/L (ref 20–29)
Calcium: 9.5 mg/dL (ref 8.7–10.2)
Chloride: 98 mmol/L (ref 96–106)
Creatinine, Ser: 1.7 mg/dL — ABNORMAL HIGH (ref 0.76–1.27)
GFR calc Af Amer: 52 mL/min/{1.73_m2} — ABNORMAL LOW (ref >59)
GFR calc non Af Amer: 45 mL/min/{1.73_m2} — ABNORMAL LOW (ref >59)
Glucose: 116 mg/dL — ABNORMAL HIGH (ref 65–99)
Potassium: 3.7 mmol/L (ref 3.5–5.2)
Sodium: 142 mmol/L (ref 134–144)

## 2019-05-21 LAB — BRAIN NATRIURETIC PEPTIDE: BNP: 4087.4 pg/mL — ABNORMAL HIGH (ref 0.0–100.0)

## 2019-05-29 ENCOUNTER — Ambulatory Visit: Payer: 59 | Admitting: Cardiology

## 2019-06-02 MED FILL — TORSEMIDE 20 MG TABLET: 20 | 30 days supply | Qty: 60 | Fill #1

## 2019-06-04 ENCOUNTER — Other Ambulatory Visit: Payer: Self-pay

## 2019-06-04 ENCOUNTER — Ambulatory Visit (INDEPENDENT_AMBULATORY_CARE_PROVIDER_SITE_OTHER): Payer: 59 | Admitting: Cardiology

## 2019-06-04 ENCOUNTER — Encounter: Payer: Self-pay | Admitting: Cardiology

## 2019-06-04 VITALS — BP 101/84 | HR 86 | Temp 96.4°F | Ht 69.0 in | Wt 310.0 lb

## 2019-06-04 DIAGNOSIS — I426 Alcoholic cardiomyopathy: Secondary | ICD-10-CM | POA: Diagnosis not present

## 2019-06-04 DIAGNOSIS — I10 Essential (primary) hypertension: Secondary | ICD-10-CM

## 2019-06-04 DIAGNOSIS — R600 Localized edema: Secondary | ICD-10-CM

## 2019-06-04 DIAGNOSIS — N179 Acute kidney failure, unspecified: Secondary | ICD-10-CM | POA: Diagnosis not present

## 2019-06-04 DIAGNOSIS — I5043 Acute on chronic combined systolic (congestive) and diastolic (congestive) heart failure: Secondary | ICD-10-CM

## 2019-06-04 MED ORDER — TORSEMIDE 20 MG PO TABS: 40.0000 mg | ORAL_TABLET | Freq: Two times a day (BID) | ORAL | 2 refills | Status: DC

## 2019-06-04 MED FILL — POTASSIUM CHL ER M10 TABLET: 10 | 90 days supply | Qty: 90 | Fill #0

## 2019-06-04 NOTE — Progress Notes (Signed)
Primary Physician/Referring:  Lavone Orn, MD  Patient ID: Christopher [name], male    DOB: 1966/07/04, 53 y.o.   MRN: [id number]  Chief Complaint  Patient presents with  . Congestive Heart Failure    10 day f/u  . Shortness of Breath  . Edema    HPI: Christopher [name]  is a 53 y.o.  [name] with  hypertension, hyperlipidemia, morbid obesity, alcohol use, history of polysubstance abuse, chronic systolic and diastolic heart failure, alcoholic dilated cardiomyopathy, and severe pulmonary hypertension by right heart and LV angiogram in March 2012. He has remained abstinent from drug use for > 20 years.  He does continue to drink alcohol and feels that he should cut back, Started drinking heavy 3 months ago. He is been seen by [place] on a frequent basis for the past one month due to acute decompensated heart failure.  We have seen in 10 days ago, he now presents for follow-up, on his last office visit he had started to feel better and his return back to work but again in the last one week he has noticed increasing weight gain, increasing leg edema. Admits to drinking again after he had quit for 3 weeks and also his food habits have gone back to not being adherent to CHF diet.   Past Medical History:  Diagnosis Date  . CHF (congestive heart failure) ([place])   . Gout   . Hypertension     History reviewed. No pertinent surgical history.  Social History   Socioeconomic History  . Marital status: Single    Spouse name: Not on file  . Number of children: 1  . Years of education: Not on file  . Highest education level: Not on file  Occupational History  . Not on file  Social Needs  . Financial resource strain: Not on file  . Food insecurity    Worry: Not on file    Inability: Not on file  . Transportation needs    Medical: Not on file    Non-medical: Not on file  Tobacco Use  . Smoking status: Former Smoker    Packs/day: 0.25    Years: 4.00    Pack years: 1.00    Types: Cigarettes    Quit  date: 05/05/2009    Years since quitting: 10.0  . Smokeless tobacco: Never Used  Substance and Sexual Activity  . Alcohol use: Yes    Comment: beer qod  . Drug use: Not Currently  . Sexual activity: Not on file  Lifestyle  . Physical activity    Days per week: Not on file    Minutes per session: Not on file  . Stress: Not on file  Relationships  . Social Herbalist on phone: Not on file    Gets together: Not on file    Attends religious service: Not on file    Active member of club or organization: Not on file    Attends meetings of clubs or organizations: Not on file    Relationship status: Not on file  . Intimate partner violence    Fear of current or ex partner: Not on file    Emotionally abused: Not on file    Physically abused: Not on file    Forced sexual activity: Not on file  Other Topics Concern  . Not on file  Social History Narrative  . Not on file    Review of Systems  Constitution: Positive for malaise/fatigue. Negative for chills,  decreased appetite and weight gain.  Cardiovascular: Positive for dyspnea on exertion and leg swelling. Negative for syncope.  Endocrine: Negative for cold intolerance.  Hematologic/Lymphatic: Does not bruise/bleed easily.  Musculoskeletal: Negative for joint swelling.  Gastrointestinal: Negative for abdominal pain, anorexia, change in bowel habit, hematochezia and melena.  Neurological: Negative for headaches and light-headedness.  Psychiatric/Behavioral: Negative for depression and substance abuse.  All other systems reviewed and are negative.     Objective  Blood pressure 101/84, pulse 86, temperature (!) 96.4 F (35.8 C), height 5\' 9"  (1.753 m), weight (!) 310 lb (140.6 kg), SpO2 96 %. Body mass index is 45.78 kg/m.    Physical Exam  Constitutional: He appears well-developed. No distress.  Morbidly obese  HENT:  Head: Atraumatic.  Eyes: Conjunctivae are normal.  Neck: Hepatojugular reflux and JVD present. No  thyromegaly present.  Short neck and difficult to evaluate JVP  Cardiovascular: Normal rate and regular rhythm. Exam reveals no gallop.  Murmur heard. High-pitched blowing holosystolic murmur is present with a grade of 3/6 at the lower left sternal border and apex. Pulses:      Carotid pulses are 2+ on the right side and 2+ on the left side.      Femoral pulses are 2+ on the right side and 2+ on the left side.      Dorsalis pedis pulses are 1+ on the right side and 1+ on the left side.       Posterior tibial pulses are 0 on the right side and 0 on the left side.  Femoral and popliteal pulse difficult to feel due to patient's body habitus.  Pedal pulse difficult to feel due to edema.  2-3 + bilateral leg edema, pitting. Non-tender  Pulmonary/Chest: Effort normal and breath sounds normal.  Abdominal: Soft. Bowel sounds are normal.  Obese. Pannus present  Musculoskeletal: Normal range of motion.        General: No edema.  Neurological: He is alert.  Skin: Skin is warm and dry.  Psychiatric: He has a normal mood and affect.   Radiology: No results found.  Laboratory examination:    CMP Latest Ref Rng & Units 05/20/2019 03/01/2011 02/28/2011  Glucose 65 - 99 mg/dL 119(J116(H) 87 96  BUN 6 - 24 mg/dL 47(W32(H) 16 16  Creatinine 0.76 - 1.27 mg/dL 2.95(A1.70(H) 2.131.22 0.861.38  Sodium 134 - 144 mmol/L 142 140 146(H)  Potassium 3.5 - 5.2 mmol/L 3.7 3.3(L) 4.1  Chloride 96 - 106 mmol/L 98 105 106  CO2 20 - 29 mmol/L 24 28 31   Calcium 8.7 - 10.2 mg/dL 9.5 8.7 9.0   CBC Latest Ref Rng & Units 02/28/2011  WBC 4.0 - 10.5 K/uL 8.7  Hemoglobin 13.0 - 17.0 g/dL 57.815.0  Hematocrit 46.939.0 - 52.0 % 44.6  Platelets 150 - 400 K/uL 188   Lipid Panel  No results found for: CHOL, TRIG, HDL, CHOLHDL, VLDL, LDLCALC, LDLDIRECT HEMOGLOBIN A1C No results found for: HGBA1C, MPG TSH No results for input(s): TSH in the last 8760 hours.  PRN Meds:. Medications Discontinued During This Encounter  Medication Reason  . torsemide  (DEMADEX) 20 MG tablet    Current Meds  Medication Sig  . allopurinol (ZYLOPRIM) 300 MG tablet daily.  Marland Kitchen. aspirin EC 81 MG tablet Take 81 mg by mouth daily.  . carvedilol (COREG) 25 MG tablet Take 1 tablet by mouth 2 (two) times a day.  Marland Kitchen. ENTRESTO 97-103 MG Take 1 tablet by mouth 2 (two) times a day.  .Marland Kitchen  potassium chloride (K-DUR) 10 MEQ tablet daily.  Marland Kitchen. spironolactone (ALDACTONE) 25 MG tablet daily.  Marland Kitchen. torsemide (DEMADEX) 20 MG tablet Take 2 tablets (40 mg total) by mouth 2 (two) times daily at 10 am and 4 pm.  . [DISCONTINUED] torsemide (DEMADEX) 20 MG tablet Take 1 tablet (20 mg total) by mouth 2 (two) times daily at 10 am and 4 pm.   Cardiac Studies:   Echocardiogram 09/09/2018: Left ventricle cavity is severely dilated. Mild concentric hypertrophy of the left ventricle. Severe decrease in global wall motion. Doppler evidence of grade III (restrictive) diastolic dysfunction, elevated LAP. Calculated EF 18%. Left atrial cavity is severely dilated. Right ventricle cavity is mildly dilated. Mildly reduced right ventricular function. Mild (Grade I) aortic regurgitation. Mild (Grade I) mitral regurgitation. Moderate tricuspid regurgitation. Estimated pulmonary artery systolic pressure 50 mmHg. Mild pulmonic regurgitation. IVC is dilated with respiratory variation. Estimated RA pressure 10-15 mmHg.  Right and left heart catheterization 02/27/2011: Findings consistent with nonischemic dilated cardiomyopathy. Severe pulmonary hypertension not reverse but total of 800 micrograms of intra-pulmonary arterial administration of nitroglycerin. Mean PA pressure 50 mmHg.  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    EKG 09/04/2018: Sinus rhythm. LVH. LAE. ST-T changes seconday to LVH.  Recommendations:   Patient's weight had improved from From 321 pounds to 298 pounds on 05/20/2019, in 10 days his gained weight back again and is now 310 pounds.  His labs were reviewed, he has developed acute renal insufficiency probably related to diuretics use and also spironolactone.  I'd like to repeat his labs today along with BNP, will increase torsemide to 40 mg b.i.d. for the next 10 days. BP is controlled.   He appears to be motivated in complete abstinence from alcohol and also trying to make lifestyle changes especially with regard to his diet.  Echocardiogram was ordered but was not performed, I'll schedule this on an urgent basis.  I'd like to see him back again in 10 days. He is on maximum dose of Entresto, if serum creatinine is still elevated, will discontinue spironolactone.  Due to 2-3+ leg edema, I prescribed him support stockings to avoid skin breakdown and advised him to keep his foot elevated when not at work and to wear them regularly.  Yates DecampJay Daneli Butkiewicz, MD, Trinity HospitalFACC 06/05/2019, 6:06 AM [facility] [place] Pager: [phone] Office: [phone] If no answer Cell [phone]

## 2019-06-05 LAB — BASIC METABOLIC PANEL
BUN/Creatinine Ratio: 22 — ABNORMAL HIGH (ref 9–20)
BUN: 39 mg/dL — ABNORMAL HIGH (ref 6–24)
CO2: 23 mmol/L (ref 20–29)
Calcium: 8.9 mg/dL (ref 8.7–10.2)
Chloride: 103 mmol/L (ref 96–106)
Creatinine, Ser: 1.77 mg/dL — ABNORMAL HIGH (ref 0.76–1.27)
GFR calc Af Amer: 49 mL/min/{1.73_m2} — ABNORMAL LOW (ref >59)
GFR calc non Af Amer: 43 mL/min/{1.73_m2} — ABNORMAL LOW (ref >59)
Glucose: 83 mg/dL (ref 65–99)
Potassium: 4.2 mmol/L (ref 3.5–5.2)
Sodium: 142 mmol/L (ref 134–144)

## 2019-06-05 LAB — BRAIN NATRIURETIC PEPTIDE: BNP: 2959.6 pg/mL — ABNORMAL HIGH (ref 0.0–100.0)

## 2019-06-05 LAB — TSH: TSH: 2.58 u[IU]/mL (ref 0.450–4.500)

## 2019-06-09 ENCOUNTER — Other Ambulatory Visit: Payer: Self-pay

## 2019-06-09 ENCOUNTER — Ambulatory Visit (HOSPITAL_COMMUNITY)
Admission: RE | Admit: 2019-06-09 | Discharge: 2019-06-09 | Disposition: A | Discharge status: Home / Self Care | Payer: 59 | Source: Ambulatory Visit | Attending: Internal Medicine | Admitting: Internal Medicine

## 2019-06-09 DIAGNOSIS — I081 Rheumatic disorders of both mitral and tricuspid valves: Secondary | ICD-10-CM | POA: Diagnosis not present

## 2019-06-09 DIAGNOSIS — I420 Dilated cardiomyopathy: Secondary | ICD-10-CM | POA: Diagnosis not present

## 2019-06-09 DIAGNOSIS — E669 Obesity, unspecified: Secondary | ICD-10-CM | POA: Diagnosis not present

## 2019-06-09 DIAGNOSIS — I110 Hypertensive heart disease with heart failure: Secondary | ICD-10-CM | POA: Diagnosis not present

## 2019-06-09 DIAGNOSIS — I5043 Acute on chronic combined systolic (congestive) and diastolic (congestive) heart failure: Secondary | ICD-10-CM | POA: Diagnosis not present

## 2019-06-09 NOTE — Progress Notes (Signed)
  Echocardiogram 2D Echocardiogram has been performed.  Christopher [name] L Androw 06/09/2019, 11:38 AM

## 2019-06-18 ENCOUNTER — Other Ambulatory Visit: Payer: Self-pay

## 2019-06-18 ENCOUNTER — Encounter: Payer: Self-pay | Admitting: Cardiology

## 2019-06-18 ENCOUNTER — Ambulatory Visit: Payer: 59 | Admitting: Cardiology

## 2019-06-18 VITALS — BP 113/81 | HR 94 | Ht 70.0 in | Wt 280.0 lb

## 2019-06-18 DIAGNOSIS — I5043 Acute on chronic combined systolic (congestive) and diastolic (congestive) heart failure: Secondary | ICD-10-CM

## 2019-06-18 DIAGNOSIS — I426 Alcoholic cardiomyopathy: Secondary | ICD-10-CM | POA: Diagnosis not present

## 2019-06-18 DIAGNOSIS — I2781 Cor pulmonale (chronic): Secondary | ICD-10-CM | POA: Diagnosis not present

## 2019-06-18 DIAGNOSIS — N179 Acute kidney failure, unspecified: Secondary | ICD-10-CM | POA: Diagnosis not present

## 2019-06-18 MED ORDER — TORSEMIDE 20 MG PO TABS: 40.0000 mg | ORAL_TABLET | Freq: Every day | ORAL | 1 refills | Status: DC

## 2019-06-18 MED FILL — ALLOPURINOL 300 MG TAB: 300 | 90 days supply | Qty: 90 | Fill #1

## 2019-06-18 MED FILL — TORSEMIDE 20 MG TABLET: 20 | 30 days supply | Qty: 120 | Fill #0

## 2019-06-18 NOTE — Progress Notes (Signed)
Primary Physician/Referring:  Kirby FunkGriffin, John, MD  Patient ID: Christopher [name], male    DOB: Jun 17, 1966, 53 y.o.   MRN: [id number]  Chief Complaint  Patient presents with  . Hypertension  . Follow-up    HPI: Christopher [name]  is a 53 y.o.  [name] with  hypertension, hyperlipidemia, morbid obesity, alcohol use, history of polysubstance abuse, chronic systolic and diastolic heart failure, alcoholic dilated cardiomyopathy, and severe pulmonary hypertension by right heart and LV angiogram in March 2012. He has remained abstinent from drug use for > 20 years.  He does continue to drink alcohol and feels that he should cut back, Started drinking heavy 3 months ago. He is being seen by us on a frequent basis for the past one month due to acute decompensated heart failure.  We have seen in 10 days ago, he now presents for follow-up, on his last office visit he had started to again notice weight gain and leg edema due to again drinking a few beers a day and not being adherent to diet. Torsemide was increased to 40 mg BID and echocardiogram was obtained. Now here to discuss results.  He states that he is feeling much better and leg edema is minimal. He has been strict with his diet, but is still drinking 3-4 beers per day. He is still working and is tolerating this well.   Past Medical History:  Diagnosis Date  . CHF (congestive heart failure) (HCC)   . Gout   . Hypertension     History reviewed. No pertinent surgical history.  Social History   Socioeconomic History  . Marital status: Single    Spouse name: Not on file  . Number of children: 1  . Years of education: Not on file  . Highest education level: Not on file  Occupational History  . Not on file  Social Needs  . Financial resource strain: Not on file  . Food insecurity    Worry: Not on file    Inability: Not on file  . Transportation needs    Medical: Not on file    Non-medical: Not on file  Tobacco Use  . Smoking status: Former  Smoker    Packs/day: 0.25    Years: 4.00    Pack years: 1.00    Types: Cigarettes    Quit date: 05/05/2009    Years since quitting: 10.1  . Smokeless tobacco: Never Used  Substance and Sexual Activity  . Alcohol use: Yes    Comment: beer qod  . Drug use: Not Currently  . Sexual activity: Not on file  Lifestyle  . Physical activity    Days per week: Not on file    Minutes per session: Not on file  . Stress: Not on file  Relationships  . Social Musicianconnections    Talks on phone: Not on file    Gets together: Not on file    Attends religious service: Not on file    Active member of club or organization: Not on file    Attends meetings of clubs or organizations: Not on file    Relationship status: Not on file  . Intimate partner violence    Fear of current or ex partner: Not on file    Emotionally abused: Not on file    Physically abused: Not on file    Forced sexual activity: Not on file  Other Topics Concern  . Not on file  Social History Narrative  . Not on file  Review of Systems  Constitution: Negative for chills, decreased appetite, malaise/fatigue and weight gain.  Cardiovascular: Positive for dyspnea on exertion (improved) and leg swelling (improved). Negative for chest pain, palpitations and syncope.  Endocrine: Negative for cold intolerance.  Hematologic/Lymphatic: Does not bruise/bleed easily.  Musculoskeletal: Negative for joint swelling.  Gastrointestinal: Negative for abdominal pain, anorexia, change in bowel habit, hematochezia and melena.  Neurological: Negative for headaches and light-headedness.  Psychiatric/Behavioral: Negative for depression and substance abuse.  All other systems reviewed and are negative.     Objective  Blood pressure 113/81, pulse 94, height 5\' 10"  (1.778 m), weight 280 lb (127 kg), SpO2 98 %. Body mass index is 40.18 kg/m.    Physical Exam  Constitutional: He appears well-developed. No distress.  Morbidly obese  HENT:  Head:  Atraumatic.  Eyes: Conjunctivae are normal.  Neck: Hepatojugular reflux and JVD present. No thyromegaly present.  Short neck and difficult to evaluate JVP  Cardiovascular: Normal rate and regular rhythm. Exam reveals no gallop.  Murmur heard. High-pitched blowing holosystolic murmur is present with a grade of 3/6 at the lower left sternal border and apex. Pulses:      Carotid pulses are 2+ on the right side and 2+ on the left side.      Femoral pulses are 2+ on the right side and 2+ on the left side.      Dorsalis pedis pulses are 1+ on the right side and 1+ on the left side.       Posterior tibial pulses are 0 on the right side and 0 on the left side.  Femoral and popliteal pulse difficult to feel due to patient's body habitus.  Pedal pulse difficult to feel due to edema.  Trace to 1+ bilateral pitting edema  Pulmonary/Chest: Effort normal and breath sounds normal.  Abdominal: Soft. Bowel sounds are normal.  Obese. Pannus present  Musculoskeletal: Normal range of motion.        General: No edema.  Neurological: He is alert.  Skin: Skin is warm and dry.  Psychiatric: He has a normal mood and affect.  Vitals reviewed.  Radiology: No results found.  Laboratory examination:    CMP Latest Ref Rng & Units 06/04/2019 05/20/2019 03/01/2011  Glucose 65 - 99 mg/dL 83 409(W116(H) 87  BUN 6 - 24 mg/dL 11(B39(H) 14(N32(H) 16  Creatinine 0.76 - 1.27 mg/dL 8.29(F1.77(H) 6.21(H1.70(H) 0.861.22  Sodium 134 - 144 mmol/L 142 142 140  Potassium 3.5 - 5.2 mmol/L 4.2 3.7 3.3(L)  Chloride 96 - 106 mmol/L 103 98 105  CO2 20 - 29 mmol/L 23 24 28   Calcium 8.7 - 10.2 mg/dL 8.9 9.5 8.7   CBC Latest Ref Rng & Units 02/28/2011  WBC 4.0 - 10.5 K/uL 8.7  Hemoglobin 13.0 - 17.0 g/dL 57.815.0  Hematocrit 46.939.0 - 52.0 % 44.6  Platelets 150 - 400 K/uL 188   Lipid Panel  No results found for: CHOL, TRIG, HDL, CHOLHDL, VLDL, LDLCALC, LDLDIRECT HEMOGLOBIN A1C No results found for: HGBA1C, MPG TSH Recent Labs    06/04/19 1328  TSH 2.580     PRN Meds:. Medications Discontinued During This Encounter  Medication Reason  . carvedilol (COREG) 25 MG tablet Error   Current Meds  Medication Sig  . allopurinol (ZYLOPRIM) 300 MG tablet daily.  Marland Kitchen. aspirin EC 81 MG tablet Take 81 mg by mouth daily.  . carvedilol (COREG) 25 MG tablet Take 25 mg by mouth 2 (two) times daily with a meal.  . ENTRESTO 97-103  MG Take 1 tablet by mouth 2 (two) times a day.  . potassium chloride (K-DUR) 10 MEQ tablet 2 (two) times daily.   Marland Kitchen. spironolactone (ALDACTONE) 25 MG tablet daily.  Marland Kitchen. torsemide (DEMADEX) 20 MG tablet Take 2 tablets (40 mg total) by mouth 2 (two) times daily at 10 am and 4 pm.   Cardiac Studies:   Echo 06/09/2019:  1. The left ventricle has severely reduced systolic function, with an ejection fraction of 20-25% 15-20%. The cavity size was severely dilated. There is mildly increased left ventricular wall thickness. Left ventricular diastolic Doppler parameters are  consistent with restrictive filling. Elevated left atrial and left ventricular end-diastolic pressures Left ventrical global hypokinesis without regional wall motion abnormalities.  2. The right ventricle has severely reduced systolic function. The cavity was severely enlarged. There is no increase in right ventricular wall thickness. Right ventricular systolic pressure is moderately elevated with an estimated pressure of 60.3  mmHg.  3. Left atrial size was severely dilated.  4. Right atrial size was severely dilated.  5. Severe mitral annular dilatation.  6. The mitral valve is abnormal. Mild thickening of the mitral valve leaflet. Mitral valve regurgitation is moderate to severe by color flow Doppler.  7. The tricuspid valve is abnormal. Tricuspid valve regurgitation is moderate.  8. Moderate annular dilatation.  9. The aortic root is normal in size and structure.  Echocardiogram 09/09/2018: Left ventricle cavity is severely dilated. Mild concentric hypertrophy of the  left ventricle. Severe decrease in global wall motion. Doppler evidence of grade III (restrictive) diastolic dysfunction, elevated LAP. Calculated EF 18%. Left atrial cavity is severely dilated. Right ventricle cavity is mildly dilated. Mildly reduced right ventricular function. Mild (Grade I) aortic regurgitation. Mild (Grade I) mitral regurgitation. Moderate tricuspid regurgitation. Estimated pulmonary artery systolic pressure 50 mmHg. Mild pulmonic regurgitation. IVC is dilated with respiratory variation. Estimated RA pressure 10-15 mmHg.  Right and left heart catheterization 02/27/2011: Findings consistent with nonischemic dilated cardiomyopathy. Severe pulmonary hypertension not reverse but total of 800 micrograms of intra-pulmonary arterial administration of nitroglycerin. Mean PA pressure 50 mmHg.  Assessment     ICD-10-CM   1. Acute on chronic combined systolic and diastolic CHF (congestive heart failure) (HCC)  I50.43 B Nat Peptide    B Nat Peptide  2. 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 Cor pulmonale (HCC)  I27.81   4. Stage 3 acute kidney injury (HCC)  N17.9 Basic metabolic panel    Basic metabolic panel     EKG 09/04/2018: Sinus rhythm. LVH. LAE. ST-T changes seconday to LVH.  Recommendations:   I have discussed recently obtained echocardiogram results with the patient, LVEF remains unchanged from previous echo; however, he is now in biventricular failure. Will continue with aggressive medical therapy. Clinically he looks better. He has started feeling better over the last 10 days with increased torsemide; however, has developed AKI due to diuretic use. As his weight has decreased from 310 to 280 lbs and has had improvement in leg edema, I will decrease Torsemide to 40 mg daily and repeat BMP along with BNP in 1 week for follow up on AKI and CHF. If he continues to have elevated creatinine levels, will have to stop aldactone. Continue with maximum  dose Entresto. He may also benefit from Bidil.   I am concerned about his long term prognosis, especially if he continues to drink alcohol and not be compliant with his diet. This was discussed with the patient. He  has been compliant with his diet for the last 10 days, and have strongly encouraged him to continue this. I have also advised him that it is imperative that he be completely abstinent from alcohol. I have encouraged him to consider rehab or AA meetings. He feels that he will be able to quit on his own, but will reach out to me if he feels that he needs assistance. I will see him back in 10 days for close monitoring.   Miquel Dunn, MSN, APRN, FNP-C [facility]. [place] Office: [phone] Fax: [phone]

## 2019-07-01 MED FILL — ENTRESTO 97 MG-103 MG TAB: 97-103 | 90 days supply | Qty: 180 | Fill #1

## 2019-07-01 MED FILL — SPIRONOLACTONE 25 MG TABS: 25 | 90 days supply | Qty: 90 | Fill #1

## 2019-07-01 MED FILL — CARVEDILOL 25 MG TABLET: 25 | 90 days supply | Qty: 180 | Fill #1

## 2019-07-21 ENCOUNTER — Ambulatory Visit: Payer: 59 | Admitting: Cardiology

## 2019-07-30 MED FILL — TORSEMIDE 20 MG TABLET: 20 | 30 days supply | Qty: 120 | Fill #1

## 2019-07-30 MED FILL — CLINDAMYCIN HCL 300 MG CAPS: 300 | 7 days supply | Qty: 28 | Fill #0

## 2019-08-12 DIAGNOSIS — M109 Gout, unspecified: Secondary | ICD-10-CM | POA: Diagnosis not present

## 2019-08-12 DIAGNOSIS — I420 Dilated cardiomyopathy: Secondary | ICD-10-CM | POA: Diagnosis not present

## 2019-08-12 DIAGNOSIS — I5040 Unspecified combined systolic (congestive) and diastolic (congestive) heart failure: Secondary | ICD-10-CM | POA: Diagnosis not present

## 2019-08-12 MED FILL — POTASSIUM CHL ER M10 TABLET: 10 | 90 days supply | Qty: 180 | Fill #0

## 2019-09-08 MED FILL — ALLOPURINOL 300 MG TABS: 300 | 90 days supply | Qty: 90 | Fill #0

## 2019-09-24 MED FILL — TORSEMIDE 20 MG TABLET: 20 | 30 days supply | Qty: 120 | Fill #2

## 2019-09-25 MED FILL — SPIRONOLACTONE 25 MG TABS: 25 | 90 days supply | Qty: 90 | Fill #0

## 2019-11-19 MED FILL — CARVEDILOL 25 MG TABLET: 25 | 90 days supply | Qty: 180 | Fill #0

## 2019-11-19 MED FILL — ENTRESTO 97 MG-103 MG TAB: 97-103 | 90 days supply | Qty: 180 | Fill #0

## 2019-11-19 MED FILL — TORSEMIDE 20 MG TABLET: 20 | 90 days supply | Qty: 90 | Fill #0

## 2019-12-17 MED FILL — POTASSIUM CHLORIDE CRYS ER: 10 | 90 days supply | Qty: 180 | Fill #1

## 2019-12-17 MED FILL — ALLOPURINOL 300 MG TABS: 300 | 90 days supply | Qty: 90 | Fill #0

## 2020-01-14 MED FILL — SPIRONOLACTONE 25 MG TABS: 25 | 90 days supply | Qty: 90 | Fill #1

## 2020-01-26 ENCOUNTER — Other Ambulatory Visit: Payer: Self-pay

## 2020-01-26 ENCOUNTER — Emergency Department (HOSPITAL_COMMUNITY): Payer: 59

## 2020-01-26 ENCOUNTER — Emergency Department (HOSPITAL_COMMUNITY)
Admission: EM | Admit: 2020-01-26 | Discharge: 2020-01-27 | Disposition: A | Discharge status: Home / Self Care | Payer: 59 | Attending: Emergency Medicine | Admitting: Emergency Medicine

## 2020-01-26 DIAGNOSIS — Z87891 Personal history of nicotine dependence: Secondary | ICD-10-CM | POA: Diagnosis not present

## 2020-01-26 DIAGNOSIS — Z7982 Long term (current) use of aspirin: Secondary | ICD-10-CM | POA: Diagnosis not present

## 2020-01-26 DIAGNOSIS — M545 Low back pain: Secondary | ICD-10-CM | POA: Insufficient documentation

## 2020-01-26 DIAGNOSIS — S00211A Abrasion of right eyelid and periocular area, initial encounter: Secondary | ICD-10-CM | POA: Diagnosis not present

## 2020-01-26 DIAGNOSIS — I5042 Chronic combined systolic (congestive) and diastolic (congestive) heart failure: Secondary | ICD-10-CM | POA: Diagnosis not present

## 2020-01-26 DIAGNOSIS — S0990XA Unspecified injury of head, initial encounter: Secondary | ICD-10-CM | POA: Diagnosis not present

## 2020-01-26 DIAGNOSIS — R519 Headache, unspecified: Secondary | ICD-10-CM | POA: Diagnosis not present

## 2020-01-26 DIAGNOSIS — I110 Hypertensive heart disease with heart failure: Secondary | ICD-10-CM | POA: Diagnosis not present

## 2020-01-26 DIAGNOSIS — I10 Essential (primary) hypertension: Secondary | ICD-10-CM | POA: Diagnosis not present

## 2020-01-26 DIAGNOSIS — M542 Cervicalgia: Secondary | ICD-10-CM | POA: Insufficient documentation

## 2020-01-26 DIAGNOSIS — M546 Pain in thoracic spine: Secondary | ICD-10-CM | POA: Insufficient documentation

## 2020-01-26 DIAGNOSIS — Y939 Activity, unspecified: Secondary | ICD-10-CM | POA: Insufficient documentation

## 2020-01-26 DIAGNOSIS — R42 Dizziness and giddiness: Secondary | ICD-10-CM | POA: Diagnosis not present

## 2020-01-26 DIAGNOSIS — Y929 Unspecified place or not applicable: Secondary | ICD-10-CM | POA: Diagnosis not present

## 2020-01-26 DIAGNOSIS — M8938 Hypertrophy of bone, other site: Secondary | ICD-10-CM | POA: Diagnosis not present

## 2020-01-26 DIAGNOSIS — Y999 Unspecified external cause status: Secondary | ICD-10-CM | POA: Insufficient documentation

## 2020-01-26 DIAGNOSIS — Z23 Encounter for immunization: Secondary | ICD-10-CM | POA: Insufficient documentation

## 2020-01-26 DIAGNOSIS — T148XXA Other injury of unspecified body region, initial encounter: Secondary | ICD-10-CM

## 2020-01-26 DIAGNOSIS — Z79899 Other long term (current) drug therapy: Secondary | ICD-10-CM | POA: Diagnosis not present

## 2020-01-26 DIAGNOSIS — R52 Pain, unspecified: Secondary | ICD-10-CM | POA: Diagnosis not present

## 2020-01-26 DIAGNOSIS — M7918 Myalgia, other site: Secondary | ICD-10-CM

## 2020-01-26 DIAGNOSIS — T07XXXA Unspecified multiple injuries, initial encounter: Secondary | ICD-10-CM | POA: Diagnosis not present

## 2020-01-26 DIAGNOSIS — S0081XA Abrasion of other part of head, initial encounter: Secondary | ICD-10-CM | POA: Insufficient documentation

## 2020-01-26 MED ORDER — LIDOCAINE HCL 2 % IJ SOLN
10.0000 mL | Freq: Once | INTRAMUSCULAR | Status: DC
  Filled 2020-01-26: qty 20

## 2020-01-26 MED ORDER — TETANUS-DIPHTH-ACELL PERTUSSIS 5-2.5-18.5 LF-MCG/0.5 IM SUSP
0.5000 mL | Freq: Once | INTRAMUSCULAR | Status: AC
  Administered 2020-01-26: 23:00:00 0.5 mL via INTRAMUSCULAR
  Filled 2020-01-26: qty 0.5

## 2020-01-26 MED ORDER — ACETAMINOPHEN 325 MG PO TABS
650.0000 mg | ORAL_TABLET | Freq: Once | ORAL | Status: AC
  Administered 2020-01-26: 650 mg via ORAL
  Filled 2020-01-26: qty 2
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
## 2020-01-27 NOTE — Discharge Instructions (Addendum)
Your x-rays and CT scans showed mild degenerative changes in your back, but no acute injuries.  You may alternate taking Tylenol and Ibuprofen as needed for pain control. You may take 400-600 mg of ibuprofen every 6 hours and [phone] mg of Tylenol every 6 hours. Do not exceed 4000 mg of Tylenol daily as this can lead to liver damage. Also, make sure to take Ibuprofen with meals as it can cause an upset stomach. Do not take other NSAIDs while taking Ibuprofen such as (Aleve, Naprosyn, Aspirin, Celebrex, etc) and do not take more than the prescribed dose as this can lead to ulcers and bleeding in your GI tract. You may use warm and cold compresses to help with your symptoms.   Please follow up with your primary doctor within the next 7-10 days for re-evaluation and further treatment of your symptoms.   Please return to the ER sooner if you have any new or worsening symptoms.

## 2020-01-28 DIAGNOSIS — M542 Cervicalgia: Secondary | ICD-10-CM | POA: Diagnosis not present

## 2020-01-28 DIAGNOSIS — M545 Low back pain: Secondary | ICD-10-CM | POA: Diagnosis not present

## 2020-02-11 MED FILL — TORSEMIDE 20 MG TABLET: 20 | 90 days supply | Qty: 90 | Fill #1

## 2020-03-01 ENCOUNTER — Other Ambulatory Visit (HOSPITAL_COMMUNITY): Payer: Self-pay | Admitting: Internal Medicine

## 2020-03-01 MED FILL — ALLOPURINOL 300 MG TABS: 300 | 90 days supply | Qty: 90 | Fill #0

## 2020-04-15 MED FILL — SPIRONOLACTONE 25 MG TABS: 25 | 90 days supply | Qty: 90 | Fill #2

## 2020-05-04 MED FILL — AMOXICILLIN 250 MG CAPSULE: 250 | 10 days supply | Qty: 30 | Fill #0

## 2020-05-04 MED FILL — AMOXICILLIN 250 MG CAPSULE: 250 | 10 days supply | Qty: 30 | Fill #0 | Status: TO

## 2020-05-04 MED FILL — metroNIDAZOLE 250 MG TABS: 250 | 10 days supply | Qty: 30 | Fill #0 | Status: TO

## 2020-05-04 MED FILL — metroNIDAZOLE 250 MG TABS: 250 | 10 days supply | Qty: 30 | Fill #0

## 2020-05-17 MED FILL — CARVEDILOL 25 MG TABLET: 25 | 90 days supply | Qty: 180 | Fill #0

## 2020-05-17 MED FILL — TORSEMIDE 20 MG TABLET: 20 | 90 days supply | Qty: 90 | Fill #0
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
## 2020-06-02 MED FILL — ENTRESTO 97 MG-103 MG TAB: 97-103 | 90 days supply | Qty: 180 | Fill #0

## 2020-06-27 MED FILL — ALLOPURINOL 300 MG TABS: 300 | 90 days supply | Qty: 90 | Fill #0

## 2020-07-19 MED FILL — SPIRONOLACTONE 25 MG TABS: 25 | 90 days supply | Qty: 90 | Fill #0

## 2020-08-03 MED FILL — TORSEMIDE 20 MG TABLET: 20 | 90 days supply | Qty: 90 | Fill #1

## 2020-08-03 MED FILL — POTASSIUM CHLORIDE CRYS ER: 10 | 90 days supply | Qty: 180 | Fill #0

## 2020-08-29 ENCOUNTER — Inpatient Hospital Stay (HOSPITAL_COMMUNITY)
Admission: EM | Admit: 2020-08-29 | Discharge: 2020-08-31 | DRG: 381 | Disposition: A | Discharge status: Home / Self Care | Payer: 59 | Attending: Family Medicine | Admitting: Family Medicine

## 2020-08-29 ENCOUNTER — Inpatient Hospital Stay (HOSPITAL_COMMUNITY): Payer: 59

## 2020-08-29 ENCOUNTER — Encounter (HOSPITAL_COMMUNITY): Payer: Self-pay

## 2020-08-29 ENCOUNTER — Other Ambulatory Visit: Payer: Self-pay

## 2020-08-29 DIAGNOSIS — Z23 Encounter for immunization: Secondary | ICD-10-CM

## 2020-08-29 DIAGNOSIS — I132 Hypertensive heart and chronic kidney disease with heart failure and with stage 5 chronic kidney disease, or end stage renal disease: Secondary | ICD-10-CM | POA: Diagnosis not present

## 2020-08-29 DIAGNOSIS — T502X5A Adverse effect of carbonic-anhydrase inhibitors, benzothiadiazides and other diuretics, initial encounter: Secondary | ICD-10-CM | POA: Diagnosis present

## 2020-08-29 DIAGNOSIS — K2970 Gastritis, unspecified, without bleeding: Secondary | ICD-10-CM | POA: Diagnosis present

## 2020-08-29 DIAGNOSIS — Z20822 Contact with and (suspected) exposure to covid-19: Secondary | ICD-10-CM | POA: Diagnosis present

## 2020-08-29 DIAGNOSIS — M109 Gout, unspecified: Secondary | ICD-10-CM | POA: Diagnosis present

## 2020-08-29 DIAGNOSIS — I10 Essential (primary) hypertension: Secondary | ICD-10-CM | POA: Diagnosis not present

## 2020-08-29 DIAGNOSIS — Z87891 Personal history of nicotine dependence: Secondary | ICD-10-CM

## 2020-08-29 DIAGNOSIS — K921 Melena: Secondary | ICD-10-CM | POA: Diagnosis not present

## 2020-08-29 DIAGNOSIS — E861 Hypovolemia: Secondary | ICD-10-CM | POA: Diagnosis present

## 2020-08-29 DIAGNOSIS — Z7982 Long term (current) use of aspirin: Secondary | ICD-10-CM

## 2020-08-29 DIAGNOSIS — Y929 Unspecified place or not applicable: Secondary | ICD-10-CM

## 2020-08-29 DIAGNOSIS — N1831 Chronic kidney disease, stage 3a: Secondary | ICD-10-CM | POA: Diagnosis not present

## 2020-08-29 DIAGNOSIS — K2211 Ulcer of esophagus with bleeding: Secondary | ICD-10-CM | POA: Diagnosis present

## 2020-08-29 DIAGNOSIS — I5042 Chronic combined systolic (congestive) and diastolic (congestive) heart failure: Secondary | ICD-10-CM | POA: Diagnosis present

## 2020-08-29 DIAGNOSIS — N19 Unspecified kidney failure: Secondary | ICD-10-CM | POA: Diagnosis not present

## 2020-08-29 DIAGNOSIS — I272 Pulmonary hypertension, unspecified: Secondary | ICD-10-CM | POA: Diagnosis present

## 2020-08-29 DIAGNOSIS — N189 Chronic kidney disease, unspecified: Secondary | ICD-10-CM

## 2020-08-29 DIAGNOSIS — K922 Gastrointestinal hemorrhage, unspecified: Secondary | ICD-10-CM | POA: Diagnosis not present

## 2020-08-29 DIAGNOSIS — R339 Retention of urine, unspecified: Secondary | ICD-10-CM | POA: Diagnosis not present

## 2020-08-29 DIAGNOSIS — I130 Hypertensive heart and chronic kidney disease with heart failure and stage 1 through stage 4 chronic kidney disease, or unspecified chronic kidney disease: Secondary | ICD-10-CM | POA: Diagnosis present

## 2020-08-29 DIAGNOSIS — F1010 Alcohol abuse, uncomplicated: Secondary | ICD-10-CM | POA: Diagnosis not present

## 2020-08-29 DIAGNOSIS — Z79899 Other long term (current) drug therapy: Secondary | ICD-10-CM

## 2020-08-29 DIAGNOSIS — K8020 Calculus of gallbladder without cholecystitis without obstruction: Secondary | ICD-10-CM | POA: Diagnosis not present

## 2020-08-29 DIAGNOSIS — E871 Hypo-osmolality and hyponatremia: Secondary | ICD-10-CM | POA: Diagnosis present

## 2020-08-29 DIAGNOSIS — N179 Acute kidney failure, unspecified: Secondary | ICD-10-CM | POA: Diagnosis not present

## 2020-08-29 DIAGNOSIS — Z8249 Family history of ischemic heart disease and other diseases of the circulatory system: Secondary | ICD-10-CM

## 2020-08-29 DIAGNOSIS — K2971 Gastritis, unspecified, with bleeding: Secondary | ICD-10-CM | POA: Diagnosis present

## 2020-08-29 DIAGNOSIS — Z6841 Body Mass Index (BMI) 40.0 and over, adult: Secondary | ICD-10-CM

## 2020-08-29 DIAGNOSIS — R195 Other fecal abnormalities: Secondary | ICD-10-CM | POA: Diagnosis not present

## 2020-08-29 DIAGNOSIS — K2210 Ulcer of esophagus without bleeding: Secondary | ICD-10-CM | POA: Diagnosis not present

## 2020-08-29 DIAGNOSIS — I5043 Acute on chronic combined systolic (congestive) and diastolic (congestive) heart failure: Secondary | ICD-10-CM | POA: Diagnosis not present

## 2020-08-29 DIAGNOSIS — R531 Weakness: Secondary | ICD-10-CM | POA: Diagnosis not present

## 2020-08-29 DIAGNOSIS — I959 Hypotension, unspecified: Secondary | ICD-10-CM | POA: Diagnosis present

## 2020-08-29 DIAGNOSIS — E669 Obesity, unspecified: Secondary | ICD-10-CM | POA: Diagnosis present

## 2020-08-29 DIAGNOSIS — I951 Orthostatic hypotension: Secondary | ICD-10-CM | POA: Diagnosis not present

## 2020-08-29 DIAGNOSIS — K269 Duodenal ulcer, unspecified as acute or chronic, without hemorrhage or perforation: Secondary | ICD-10-CM | POA: Diagnosis not present

## 2020-08-29 DIAGNOSIS — I426 Alcoholic cardiomyopathy: Secondary | ICD-10-CM | POA: Diagnosis present

## 2020-08-29 DIAGNOSIS — D500 Iron deficiency anemia secondary to blood loss (chronic): Secondary | ICD-10-CM | POA: Diagnosis not present

## 2020-08-29 DIAGNOSIS — D649 Anemia, unspecified: Secondary | ICD-10-CM | POA: Diagnosis not present

## 2020-08-29 DIAGNOSIS — K219 Gastro-esophageal reflux disease without esophagitis: Secondary | ICD-10-CM | POA: Diagnosis not present

## 2020-08-29 DIAGNOSIS — Z7289 Other problems related to lifestyle: Secondary | ICD-10-CM | POA: Diagnosis not present

## 2020-08-29 DIAGNOSIS — K319 Disease of stomach and duodenum, unspecified: Secondary | ICD-10-CM | POA: Diagnosis not present

## 2020-08-29 DIAGNOSIS — K264 Chronic or unspecified duodenal ulcer with hemorrhage: Secondary | ICD-10-CM | POA: Diagnosis present

## 2020-08-29 LAB — COMPREHENSIVE METABOLIC PANEL
ALT: 32 U/L (ref 0–44)
AST: 53 U/L — ABNORMAL HIGH (ref 15–41)
Albumin: 3.3 g/dL — ABNORMAL LOW (ref 3.5–5.0)
Alkaline Phosphatase: 151 U/L — ABNORMAL HIGH (ref 38–126)
Anion gap: 14 (ref 5–15)
BUN: 107 mg/dL — ABNORMAL HIGH (ref 6–20)
CO2: 24 mmol/L (ref 22–32)
Calcium: 8.8 mg/dL — ABNORMAL LOW (ref 8.9–10.3)
Chloride: 90 mmol/L — ABNORMAL LOW (ref 98–111)
Creatinine, Ser: 5.42 mg/dL — ABNORMAL HIGH (ref 0.61–1.24)
GFR calc Af Amer: 13 mL/min — ABNORMAL LOW (ref >60)
GFR calc non Af Amer: 11 mL/min — ABNORMAL LOW (ref >60)
Glucose, Bld: 119 mg/dL — ABNORMAL HIGH (ref 70–99)
Potassium: 5 mmol/L (ref 3.5–5.1)
Sodium: 128 mmol/L — ABNORMAL LOW (ref 135–145)
Total Bilirubin: 0.9 mg/dL (ref 0.3–1.2)
Total Protein: 6.9 g/dL (ref 6.5–8.1)

## 2020-08-29 LAB — TYPE AND SCREEN
ABO/RH(D): A POS
Antibody Screen: NEGATIVE

## 2020-08-29 LAB — URINALYSIS, ROUTINE W REFLEX MICROSCOPIC
Bilirubin Urine: NEGATIVE
Glucose, UA: NEGATIVE mg/dL
Hgb urine dipstick: NEGATIVE
Ketones, ur: NEGATIVE mg/dL
Leukocytes,Ua: NEGATIVE
Nitrite: NEGATIVE
Protein, ur: NEGATIVE mg/dL
Specific Gravity, Urine: 1.01 (ref 1.005–1.030)
pH: 5 (ref 5.0–8.0)

## 2020-08-29 LAB — ABO/RH: ABO/RH(D): A POS

## 2020-08-29 LAB — PROTIME-INR
INR: 1.1 (ref 0.8–1.2)
Prothrombin Time: 13.5 seconds (ref 11.4–15.2)

## 2020-08-29 LAB — CBC
HCT: 25.1 % — ABNORMAL LOW (ref 39.0–52.0)
Hemoglobin: 8.8 g/dL — ABNORMAL LOW (ref 13.0–17.0)
MCH: 37.9 pg — ABNORMAL HIGH (ref 26.0–34.0)
MCHC: 35.1 g/dL (ref 30.0–36.0)
MCV: 108.2 fL — ABNORMAL HIGH (ref 80.0–100.0)
Platelets: 177 10*3/uL (ref 150–400)
RBC: 2.32 MIL/uL — ABNORMAL LOW (ref 4.22–5.81)
RDW: 14.6 % (ref 11.5–15.5)
WBC: 9.9 10*3/uL (ref 4.0–10.5)
nRBC: 0 % (ref 0.0–0.2)

## 2020-08-29 LAB — HEMOGLOBIN AND HEMATOCRIT, BLOOD
HCT: 25.7 % — ABNORMAL LOW (ref 39.0–52.0)
Hemoglobin: 9.4 g/dL — ABNORMAL LOW (ref 13.0–17.0)

## 2020-08-29 LAB — SARS CORONAVIRUS 2 BY RT PCR (HOSPITAL ORDER, PERFORMED IN ~~LOC~~ HOSPITAL LAB): SARS Coronavirus 2: NEGATIVE

## 2020-08-29 LAB — PHOSPHORUS: Phosphorus: 3.9 mg/dL (ref 2.5–4.6)

## 2020-08-29 LAB — SODIUM, URINE, RANDOM: Sodium, Ur: 44 mmol/L

## 2020-08-29 LAB — TSH: TSH: 1.515 u[IU]/mL (ref 0.350–4.500)

## 2020-08-29 LAB — CREATININE, URINE, RANDOM: Creatinine, Urine: 130.65 mg/dL

## 2020-08-29 LAB — MAGNESIUM: Magnesium: 1.7 mg/dL (ref 1.7–2.4)

## 2020-08-29 LAB — POC OCCULT BLOOD, ED: Fecal Occult Bld: POSITIVE — AB

## 2020-08-29 LAB — APTT: aPTT: 30 seconds (ref 24–36)

## 2020-08-29 LAB — SODIUM: Sodium: 134 mmol/L — ABNORMAL LOW (ref 135–145)

## 2020-08-29 MED ORDER — THIAMINE HCL 100 MG/ML IJ SOLN
100.0000 mg | Freq: Every day | INTRAMUSCULAR | Status: DC
  Administered 2020-08-30: 100 mg via INTRAVENOUS
  Filled 2020-08-29: qty 2

## 2020-08-29 MED ORDER — LACTATED RINGERS IV BOLUS
1000.0000 mL | Freq: Once | INTRAVENOUS | Status: AC
  Administered 2020-08-29: 1000 mL via INTRAVENOUS

## 2020-08-29 MED ORDER — ONDANSETRON HCL 4 MG PO TABS: 4.0000 mg | ORAL_TABLET | Freq: Four times a day (QID) | ORAL | Status: DC | PRN

## 2020-08-29 MED ORDER — FOLIC ACID 1 MG PO TABS
1.0000 mg | ORAL_TABLET | Freq: Every day | ORAL | Status: DC
  Administered 2020-08-29 – 2020-08-31 (×3): 1 mg via ORAL
  Filled 2020-08-29 (×3): qty 1

## 2020-08-29 MED ORDER — LORAZEPAM 2 MG/ML IJ SOLN: 1.0000 mg | INTRAMUSCULAR | Status: DC | PRN

## 2020-08-29 MED ORDER — LORAZEPAM 1 MG PO TABS: 1.0000 mg | ORAL_TABLET | ORAL | Status: DC | PRN

## 2020-08-29 MED ORDER — ACETAMINOPHEN 325 MG PO TABS: 650.0000 mg | ORAL_TABLET | Freq: Four times a day (QID) | ORAL | Status: DC | PRN

## 2020-08-29 MED ORDER — INFLUENZA VAC SPLIT QUAD 0.5 ML IM SUSY
0.5000 mL | PREFILLED_SYRINGE | INTRAMUSCULAR | Status: AC
  Administered 2020-08-31: 0.5 mL via INTRAMUSCULAR
  Filled 2020-08-29: qty 0.5

## 2020-08-29 MED ORDER — ONDANSETRON HCL 4 MG/2ML IJ SOLN: 4.0000 mg | Freq: Four times a day (QID) | INTRAMUSCULAR | Status: DC | PRN

## 2020-08-29 MED ORDER — ADULT MULTIVITAMIN W/MINERALS CH
1.0000 | ORAL_TABLET | Freq: Every day | ORAL | Status: DC
  Administered 2020-08-29 – 2020-08-31 (×3): 1 via ORAL
  Filled 2020-08-29 (×3): qty 1

## 2020-08-29 MED ORDER — ACETAMINOPHEN 650 MG RE SUPP: 650.0000 mg | Freq: Four times a day (QID) | RECTAL | Status: DC | PRN

## 2020-08-29 MED ORDER — SODIUM CHLORIDE 0.9 % IV SOLN: INTRAVENOUS | Status: AC

## 2020-08-29 MED ORDER — THIAMINE HCL 100 MG PO TABS
100.0000 mg | ORAL_TABLET | Freq: Every day | ORAL | Status: DC
  Administered 2020-08-29 – 2020-08-31 (×2): 100 mg via ORAL
  Filled 2020-08-29 (×3): qty 1

## 2020-08-29 MED ORDER — PANTOPRAZOLE SODIUM 40 MG IV SOLR
40.0000 mg | Freq: Two times a day (BID) | INTRAVENOUS | Status: DC
  Administered 2020-08-29 – 2020-08-31 (×4): 40 mg via INTRAVENOUS
  Filled 2020-08-29 (×4): qty 40

## 2020-08-29 MED ORDER — POLYETHYLENE GLYCOL 3350 17 G PO PACK: 17.0000 g | PACK | Freq: Every day | ORAL | Status: DC | PRN

## 2020-08-29 MED FILL — PANTOPRAZOLE SOD DR 40 MG T: 40 | 14 days supply | Qty: 28 | Fill #0

## 2020-08-29 NOTE — ED Triage Notes (Signed)
Patient reports that he has been having dark blood in his stools x 1 month or more/ BP in triage 69/50.

## 2020-08-29 NOTE — Consult Note (Signed)
Reason for Consult: Renal failure Referring Physician:  Dr. [place]  Chief Complaint: Weakness  Assessment/Plan: 1. Acute on CKDIIIa (in 05/2019 but certainly may have progressed) - creatinine was 1.22 02/2011 and had progression of CKD when next blood results that we have in Epic was in 05/2019.  He has not had any outpatient blood draws until the past week and of concern is anorexia with poor intake for 2-3 months already with nausea more recently as well. Unclear if there has been progression of the CKD and only time will tell but hopefully there is a significant acute component. - Agree with holding Entresto, Aldactone, KDdur replacement and torsemide especially as he's had weight loss and the edema is markedly improved. He also appears to be on the dry side. - Renal ultrasound to rule out obstruction and check size of kidneys + cortical thickness. - Renal studies as well and if there is significant hematuria or proteinuria then will send serologies. - Strict I&O's, daily weights and avoid nephrotoxic agents. - I also introduced the concept of progression of renal disease to  possibly  ESRD and patient wants to think about RRT if it becomes necessary. Fortunately at this time there is no absolute indication for RRT. 2. Hyponatremia -  beer potomania likely with reset osmostat. I doubt if he runs a normal serum Na. TSH nl. Again will check urine studies and ok to gently hydrate but if Na decreases then will need to stop the IVF. I doubt this is  cardiorenal and  gentle fluids is appropriate. 3. CHF diastolic + systolic - compensated. 4. ETOH 5. Anemia secondary to GIB? - h/o what appears to be melena but may be progression of CKD as well + GIB. Will check iron studies.   HPI: Christopher [name] is an 54 y.o. male CHF w/ LVEF 20-25%, dilated CM secondary to ETOH, severe pulmonary hypertension, obesity, gout, CKD IIIa presenting with weakness, fatigue and dizziness for a week with dark soft non formed  stools as well. He has continued to use ETOH with 3-4 beers daily and was noted to have a Hb of 8.8 in the ED with a sodium of 128 and BUN/Cr of 107/5.42. Patient was given a liter of LR resuscitation. Patient had a creatinine in the 1.7-1.8 range in 05/2019 and is on aldactone + torsemide + Entresto. He denies obstructive symptoms, dysuria, hematuria, swelling in the legs or worsening of baseline dyspnea. He also denies having any family members on dialysis and has been compliant with his medications. He has not had any blood draws since 05/2019 and only recently saw a physician in the past week. He denies joint pain, rashes, hemoptysis and states that he used to have a lot of swelling in the lower extremities and currently does not have any. He is on torsemide qdaily dosing. Of note he has had a very poor appetite for 2-3 months already with some associated nausea.   ROS Pertinent items are noted in HPI.  Chemistry and CBC: Creatinine, Ser  Date/Time Value Ref Range Status  08/29/2020 02:17 PM 5.42 (H) 0.61 - 1.24 mg/dL Final  53/61/4431 54:00 PM 1.77 (H) 0.76 - 1.27 mg/dL Final  [birth year]1950 93:26 PM 1.70 (H) 0.76 - 1.27 mg/dL Final  71/24/5809 98:33 AM 1.22 0.40 - 1.50 mg/dL Final  82/50/5397 67:34 AM 1.38 0.40 - 1.50 mg/dL Final   Recent Labs  Lab 08/29/20 1417  NA 128*  K 5.0  CL 90*  CO2 24  GLUCOSE 119*  BUN 107*  CREATININE 5.42*  CALCIUM 8.8*   Recent Labs  Lab 08/29/20 1417  WBC 9.9  HGB 8.8*  HCT 25.1*  MCV 108.2*  PLT 177   Liver Function Tests: Recent Labs  Lab 08/29/20 1417  AST 53*  ALT 32  ALKPHOS 151*  BILITOT 0.9  PROT 6.9  ALBUMIN 3.3*   No results for input(s): LIPASE, AMYLASE in the last 168 hours. No results for input(s): AMMONIA in the last 168 hours. Cardiac Enzymes: No results for input(s): CKTOTAL, CKMB, CKMBINDEX, TROPONINI in the last 168 hours. Iron Studies: No results for input(s): IRON, TIBC, TRANSFERRIN, FERRITIN in the last 72  hours. PT/INR: @LABRCNTIP (inr:5)  Xrays/Other Studies: ) Results for orders placed or performed during the hospital encounter of 08/29/20 (from the past 48 hour(s))  Comprehensive metabolic panel     Status: Abnormal   Collection Time: 08/29/20  2:17 PM  Result Value Ref Range   Sodium 128 (L) 135 - 145 mmol/L   Potassium 5.0 3.5 - 5.1 mmol/L   Chloride 90 (L) 98 - 111 mmol/L   CO2 24 22 - 32 mmol/L   Glucose, Bld 119 (H) 70 - 99 mg/dL    Comment: Glucose reference range applies only to samples taken after fasting for at least 8 hours.   BUN 107 (H) 6 - 20 mg/dL    Comment: RESULTS CONFIRMED BY MANUAL DILUTION   Creatinine, Ser 5.42 (H) 0.61 - 1.24 mg/dL   Calcium 8.8 (L) 8.9 - 10.3 mg/dL   Total Protein 6.9 6.5 - 8.1 g/dL   Albumin 3.3 (L) 3.5 - 5.0 g/dL   AST 53 (H) 15 - 41 U/L   ALT 32 0 - 44 U/L   Alkaline Phosphatase 151 (H) 38 - 126 U/L   Total Bilirubin 0.9 0.3 - 1.2 mg/dL   GFR calc non Af Amer 11 (L) >60 mL/min   GFR calc Af Amer 13 (L) >60 mL/min   Anion gap 14 5 - 15    Comment: Performed at [facility] [address] [place], [place]  CBC     Status: Abnormal   Collection Time: 08/29/20  2:17 PM  Result Value Ref Range   WBC 9.9 4.0 - 10.5 K/uL   RBC 2.32 (L) 4.22 - 5.81 MIL/uL   Hemoglobin 8.8 (L) 13.0 - 17.0 g/dL   HCT 08/31/20 (L) 39 - 52 %   MCV 108.2 (H) 80.0 - 100.0 fL   MCH 37.9 (H) 26.0 - 34.0 pg   MCHC 35.1 30.0 - 36.0 g/dL   RDW 18.2 99.3 - 71.6 %   Platelets 177 150 - 400 K/uL   nRBC 0.0 0.0 - 0.2 %    Comment: Performed at [facility], [address] [place], [place]  Type and screen [facility]     Status: None   Collection Time: 08/29/20  2:17 PM  Result Value Ref Range   ABO/RH(D) A POS    Antibody Screen NEG    Sample Expiration      09/01/2020,2359 Performed at [facility], [address] [place], [place]   SARS Coronavirus 2 by  RT PCR (hospital order, performed in [facility] hospital lab) Nasopharyngeal Nasopharyngeal Swab     Status: None   Collection Time: 08/29/20  2:17 PM   Specimen: Nasopharyngeal Swab  Result Value Ref Range   SARS Coronavirus 2 NEGATIVE NEGATIVE    Comment: (  NOTE) SARS-CoV-2 target nucleic acids are NOT DETECTED.  The SARS-CoV-2 RNA is generally detectable in upper and lower respiratory specimens during the acute phase of infection. The lowest concentration of SARS-CoV-2 viral copies this assay can detect is 250 copies / mL. A negative result does not preclude SARS-CoV-2 infection and should not be used as the sole basis for treatment or other patient management decisions.  A negative result may occur with improper specimen collection / handling, submission of specimen other than nasopharyngeal swab, presence of viral mutation(s) within the areas targeted by this assay, and inadequate number of viral copies (<250 copies / mL). A negative result must be combined with clinical observations, patient history, and epidemiological information.  Fact Sheet for Patients:   BoilerBrush.com.cyhttps://www.fda.gov/media/136312/download  Fact Sheet for Healthcare Providers: https://pope.com/https://www.fda.gov/media/136313/download  This test is not yet approved or  cleared by the [place] FDA and has been authorized for detection and/or diagnosis of SARS-CoV-2 by FDA under an Emergency Use Authorization (EUA).  This EUA will remain in effect (meaning this test can be used) for the duration of the COVID-19 declaration under Section 564(b)(1) of the Act, 21 U.S.C. section 360bbb-3(b)(1), unless the authorization is terminated or revoked sooner.  Performed at [facility], [address] [place], [place] [address]   APTT     Status: None   Collection Time: 08/29/20  2:18 PM  Result Value Ref Range   aPTT 30 24 - 36 seconds    Comment: Performed at [facility], [address] [place], [place] [address]  Protime-INR     Status: None   Collection Time: 08/29/20  2:18 PM  Result Value Ref Range   Prothrombin Time 13.5 11.4 - 15.2 seconds   INR 1.1 0.8 - 1.2    Comment: (NOTE) INR goal varies based on device and disease states. Performed at [facility], [address] [place], [place] [address]   ABO/Rh     Status: None   Collection Time: 08/29/20  2:25 PM  Result Value Ref Range   ABO/RH(D)      A POS Performed at [facility], [address] [place], [place] [address]   POC occult blood, ED     Status: Abnormal   Collection Time: 08/29/20  2:33 PM  Result Value Ref Range   Fecal Occult Bld POSITIVE (A) NEGATIVE   No results found.  PMH:   Past Medical History:  Diagnosis Date  . CHF (congestive heart failure) (HCC)   . Gout   . Hypertension     PSH:  History reviewed. No pertinent surgical history.  Allergies: No Known Allergies  Medications:   Prior to Admission medications   Medication Sig Start Date End Date Taking? Authorizing Provider  allopurinol (ZYLOPRIM) 300 MG tablet daily. 03/16/19   [provider]  aspirin EC 81 MG tablet Take 81 mg by mouth daily.    [provider]  carvedilol (COREG) 25 MG tablet Take 25 mg by mouth 2 (two) times daily with a meal.    [provider]  ENTRESTO 97-103 MG Take 1 tablet by mouth 2 (two) times a day. 03/16/19   [provider]  potassium chloride (K-DUR) 10 MEQ tablet 2 (two) times daily.  02/12/19   [provider]  spironolactone (ALDACTONE) 25 MG tablet daily. 03/31/19   [provider]  torsemide (DEMADEX) 20 MG tablet Take 2 tablets (40 mg total) by mouth daily. 06/18/19  Toniann Fail, NP    Discontinued Meds:  There are no discontinued medications.  Social History:  reports that he quit smoking about 11 years ago. His smoking use included cigarettes. He has a 1.00 pack-year smoking history.  He has never used smokeless tobacco. He reports current alcohol use. He reports previous drug use.  Family History:   Family History  Problem Relation Age of Onset  . Hypertension Mother     Blood pressure 114/69, pulse 71, temperature 98.6 F (37 C), temperature source Oral, resp. rate 17, height 5\' 10"  (1.778 m), weight 129.7 kg, SpO2 100 %. General appearance: alert, cooperative and appears stated age Head: Normocephalic, without obvious abnormality, atraumatic Eyes: conj pallor Neck: no adenopathy, no carotid bruit, no JVD, supple, symmetrical, trachea midline and thyroid not enlarged, symmetric, no tenderness/mass/nodules Back: symmetric, no curvature. ROM normal. No CVA tenderness. Resp: clear to auscultation bilaterally Chest wall: no tenderness Cardio: regular rate and rhythm GI: obese but soft Extremities: edema trace Pulses: 2+ and symmetric Skin: Skin color, texture, turgor normal. No rashes or lesions Lymph nodes: Cervical adenopathy: none       Jemiah Ellenburg, , MD 08/29/2020, 6:30 PM

## 2020-08-29 NOTE — ED Notes (Signed)
Patient states that he drinks about 3-4 budlights and 1 shot of vodka every day and he states that is last drink was on 08-28-20 at around 2000.

## 2020-08-29 NOTE — ED Notes (Signed)
Ultrasound at bedside

## 2020-08-29 NOTE — ED Provider Notes (Addendum)
[facility]-EMERGENCY DEPT Provider Note   CSN: [id number] Arrival date & time: 08/29/20  1327     History Chief Complaint  Patient presents with  . Blood In Stools  . Hypotension    Christopher [name] is a 54 y.o. male.  Patient was sent here by his primary care provider for abnormal labs in the setting of dark stools.  Is also been having lightheadedness and fatigue.  The dark stools have been going on for a few weeks.  Takes daily aspirin but no blood thinners.  No belly pain no nausea.  No history of the same.  No recent illness otherwise        Past Medical History:  Diagnosis Date  . CHF (congestive heart failure) (HCC)   . Gout   . Hypertension     Patient Active Problem List   Diagnosis Date Noted  . GI bleed 08/29/2020  . 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 Essential hypertension 05/06/2019  . Acute on chronic combined systolic and diastolic CHF (congestive heart failure) (HCC) 05/06/2019  . Obesity 05/06/2019    History reviewed. No pertinent surgical history.     Family History  Problem Relation Age of Onset  . Hypertension Mother     Social History   Tobacco Use  . Smoking status: Former Smoker    Packs/day: 0.25    Years: 4.00    Pack years: 1.00    Types: Cigarettes    Quit date: 05/05/2009    Years since quitting: 11.3  . Smokeless tobacco: Never Used  Vaping Use  . Vaping Use: Never used  Substance Use Topics  . Alcohol use: Yes    Comment: beer qod  . Drug use: Not Currently    Home Medications Prior to Admission medications   Medication Sig Start Date End Date Taking? Authorizing Provider  allopurinol (ZYLOPRIM) 300 MG tablet daily. 03/16/19   [provider]  aspirin EC 81 MG tablet Take 81 mg by mouth daily.    [provider]  carvedilol (COREG) 25 MG tablet Take 25 mg by mouth 2 (two) times daily with a meal.    [provider]  ENTRESTO 97-103 MG Take 1  tablet by mouth 2 (two) times a day. 03/16/19   [provider]  potassium chloride (K-DUR) 10 MEQ tablet 2 (two) times daily.  02/12/19   [provider]  spironolactone (ALDACTONE) 25 MG tablet daily. 03/31/19   [provider]  torsemide (DEMADEX) 20 MG tablet Take 2 tablets (40 mg total) by mouth daily. 06/18/19   Toniann Fail, NP    Allergies    Patient has no known allergies.  Review of Systems   Review of Systems  Constitutional: Positive for fatigue. Negative for chills and fever.  HENT: Negative for congestion and rhinorrhea.   Respiratory: Negative for cough and shortness of breath.   Cardiovascular: Negative for chest pain and palpitations.  Gastrointestinal: Positive for blood in stool and diarrhea. Negative for anal bleeding, nausea and vomiting.  Genitourinary: Negative for difficulty urinating and dysuria.  Musculoskeletal: Negative for arthralgias and back pain.  Skin: Negative for color change and rash.  Neurological: Positive for light-headedness. Negative for headaches.    Physical Exam Updated Vital Signs BP 128/75   Pulse 73   Temp 98.6 F (37 C) (Oral)   Resp 16   Ht 5\' 10"  (1.778 m)   Wt 129.7 kg   SpO2 100%  BMI 41.04 kg/m   Physical Exam Vitals and nursing note reviewed. Exam conducted with a chaperone present.  Constitutional:      General: He is not in acute distress.    Appearance: Normal appearance.  HENT:     Head: Normocephalic and atraumatic.     Nose: No rhinorrhea.  Eyes:     General:        Right eye: No discharge.        Left eye: No discharge.     Conjunctiva/sclera: Conjunctivae normal.  Cardiovascular:     Rate and Rhythm: Normal rate and regular rhythm.  Pulmonary:     Effort: Pulmonary effort is normal.     Breath sounds: No stridor.  Abdominal:     General: Abdomen is flat. There is no distension.     Palpations: Abdomen is soft.  Genitourinary:    Rectum: Normal.     Comments:  Nontender to palpation no gross blood no melena Musculoskeletal:        General: No deformity or signs of injury.  Skin:    General: Skin is warm and dry.  Neurological:     General: No focal deficit present.     Mental Status: He is alert. Mental status is at baseline.     Motor: No weakness.  Psychiatric:        Mood and Affect: Mood normal.        Behavior: Behavior normal.        Thought Content: Thought content normal.     ED Results / Procedures / Treatments   Labs (all labs ordered are listed, but only abnormal results are displayed) Labs Reviewed  COMPREHENSIVE METABOLIC PANEL - Abnormal; Notable for the following components:      Result Value   Sodium 128 (*)    Chloride 90 (*)    Glucose, Bld 119 (*)    Creatinine, Ser 5.42 (*)    Calcium 8.8 (*)    Albumin 3.3 (*)    AST 53 (*)    Alkaline Phosphatase 151 (*)    GFR calc non Af Amer 11 (*)    GFR calc Af Amer 13 (*)    All other components within normal limits  CBC - Abnormal; Notable for the following components:   RBC 2.32 (*)    Hemoglobin 8.8 (*)    HCT 25.1 (*)    MCV 108.2 (*)    MCH 37.9 (*)    All other components within normal limits  POC OCCULT BLOOD, ED - Abnormal; Notable for the following components:   Fecal Occult Bld POSITIVE (*)    All other components within normal limits  SARS CORONAVIRUS 2 BY RT PCR (HOSPITAL ORDER, PERFORMED IN [facility] LAB)  APTT  PROTIME-INR  TYPE AND SCREEN  ABO/RH    EKG EKG Interpretation  Date/Time:  Monday August 29 2020 14:38:30 EDT Ventricular Rate:  68 PR Interval:    QRS Duration: 101 QT Interval:  411 QTC Calculation: 438 R Axis:   7 Text Interpretation: Sinus rhythm Low voltage, extremity and precordial leads Confirmed by Cherlynn Perches ([id number]) on 08/29/2020 3:03:17 PM   Radiology No results found.  Procedures .Critical Care Performed by: Sabino Donovan, MD Authorized by: Sabino Donovan, MD   Critical care provider statement:     Critical care time (minutes):  45   Critical care was necessary to treat or prevent imminent or life-threatening deterioration of the following conditions:  Circulatory failure  Critical care was time spent personally by me on the following activities:  Discussions with consultants, evaluation of patient's response to treatment, examination of patient, ordering and performing treatments and interventions, ordering and review of laboratory studies, ordering and review of radiographic studies, pulse oximetry, re-evaluation of patient's condition, obtaining history from patient or surrogate, review of old charts and blood draw for specimens   (including critical care time)  Medications Ordered in ED Medications  influenza vac split quadrivalent PF (FLUARIX) injection 0.5 mL (has no administration in time range)  lactated ringers bolus 1,000 mL (0 mLs Intravenous Stopped 08/29/20 1608)    ED Course  I have reviewed the triage vital signs and the nursing notes.  Pertinent labs & imaging results that were available during my care of the patient were reviewed by me and considered in my medical decision making (see chart for details).    MDM Rules/Calculators/A&P                          Patient comes in with concern for GI bleed, possible organ dysfunction, history of heart failure on aspirin but no other blood thinners.  Breathing comfortably on room air.  Abdomen soft nontender nondistended, no pain so do not think there is a surgical emergency.  However he may have blood loss secondary to GI bleed.  Guaiac is sent but no gross blood or melena on exam.  Empty rectal vault otherwise.  No history of bright red blood per rectum or hematemesis.  Patient's blood pressure continues to improve with fluid bolus.  He is resting comfortably.  Hemoglobin is 8.8 concerning drop from previous noted.  Still waiting for chemistry.  No need for emergent transfusion at this time.  Will consult the hospitalist for  admission and gastroenterology for potential procedure and intervention. Chemistry still pending.  The hospitalist agrees to admission. The patient will be admitted to the hospitalist.  For the remainder this patient's care please see inpatient team notes.  I will intervene as needed while the patient remains in the emergency department.  Final Clinical Impression(s) / ED Diagnoses Final diagnoses:  Melena  Blood loss anemia    Rx / DC Orders ED Discharge Orders    None       Sabino Donovan, MD 08/29/20 1703    Sabino Donovan, MD 08/30/20 [phone]

## 2020-08-29 NOTE — H&P (Signed)
History and Physical    Christopher [name] HKV:[id number] DOB: 1966/05/19 DOA: 08/29/2020  PCP: Kirby Funk, MD  Patient coming from: Home/PCP office  I have personally briefly reviewed patient's old medical records in [facility]  Chief Complaint: Weakness, fatigue, dizziness  HPI: Christopher [name] is a 54 y.o. male with medical history significant of chronic combined diastolic/systolic congestive heart failure, severe pulmonary hypertension, alcohol related dilated cardiomyopathy, CKD stage IIIa, morbid obesity, gout who presented to [facility] long ED after PCP visit for abnormal labs. Over the past 1-2 weeks, patient reports increasing weakness, fatigue and dizziness. He is also noted dark stools during this time. He denies any NSAID use, only on aspirin. Although patient does endorse continued EtOH abuse with 3-4 beers daily. Patient without any other complaints at this time.  ED Course: Temperature 98.6, HR 69, RR 15, BP 110/62, SPO2 100% on room air. WBC count 9.9, hemoglobin 8.8, platelets 177. Sodium 128, potassium 5.0, CO2 90, chloride 24, BUN 107, creatinine 5.42, glucose 119. Patient was given 1 L bolus LR. EDP consulted GI, Dr. Elnoria Howard for evaluation. Hospitalist consulted for admission.  Review of Systems: As per HPI otherwise 10 point review of systems negative.    Past Medical History:  Diagnosis Date  . CHF (congestive heart failure) (HCC)   . Gout   . Hypertension     History reviewed. No pertinent surgical history.   reports that he quit smoking about 11 years ago. His smoking use included cigarettes. He has a 1.00 pack-year smoking history. He has never used smokeless tobacco. He reports current alcohol use. He reports previous drug use.  No Known Allergies  Family History  Problem Relation Age of Onset  . Hypertension Mother     Family history reviewed and not pertinent   Prior to Admission medications   Medication Sig Start Date End Date Taking? Authorizing  Provider  allopurinol (ZYLOPRIM) 300 MG tablet daily. 03/16/19   [provider]  aspirin EC 81 MG tablet Take 81 mg by mouth daily.    [provider]  carvedilol (COREG) 25 MG tablet Take 25 mg by mouth 2 (two) times daily with a meal.    [provider]  ENTRESTO 97-103 MG Take 1 tablet by mouth 2 (two) times a day. 03/16/19   [provider]  potassium chloride (K-DUR) 10 MEQ tablet 2 (two) times daily.  02/12/19   [provider]  spironolactone (ALDACTONE) 25 MG tablet daily. 03/31/19   [provider]  torsemide (DEMADEX) 20 MG tablet Take 2 tablets (40 mg total) by mouth daily. 06/18/19   Toniann Fail, NP    Physical Exam: Vitals:   08/29/20 1500 08/29/20 1530 08/29/20 1600 08/29/20 1650  BP: (!) 91/54 (!) 104/59 110/62 128/75  Pulse: 70 74 69 73  Resp: 15 16 15 16   Temp:      TempSrc:      SpO2: 100% 100% 100% 100%  Weight:      Height:        Constitutional: NAD, calm, comfortable, obese Eyes: PERRL, lids and conjunctivae normal ENMT: Mucous membranes are moist. Posterior pharynx clear of any exudate or lesions.Normal dentition. Nodules noted to subligual region Neck: normal, supple, no masses, no thyromegaly Respiratory: clear to auscultation bilaterally, no wheezing, no crackles. Normal respiratory effort. No accessory muscle use.  Cardiovascular: Regular rate and rhythm, no murmurs / rubs / gallops. No extremity edema. 2+ pedal pulses. No carotid bruits.  Abdomen: no  tenderness, no masses palpated. No hepatosplenomegaly. Bowel sounds positive.  Musculoskeletal: no clubbing / cyanosis. No joint deformity upper and lower extremities. Good ROM, no contractures. Normal muscle tone.  Skin: no rashes, lesions, ulcers. No induration Neurologic: CN 2-12 grossly intact. Sensation intact, DTR normal. Strength 5/5 in all 4.  Psychiatric: Normal judgment and insight. Alert and oriented x 3. Normal mood.       Labs on  Admission: I have personally reviewed following labs and imaging studies  CBC: Recent Labs  Lab 08/29/20 1417  WBC 9.9  HGB 8.8*  HCT 25.1*  MCV 108.2*  PLT 177   Basic Metabolic Panel: Recent Labs  Lab 08/29/20 1417  NA 128*  K 5.0  CL 90*  CO2 24  GLUCOSE 119*  BUN 107*  CREATININE 5.42*  CALCIUM 8.8*   GFR: Estimated Creatinine Clearance: 20.8 mL/min (A) (by C-G formula based on SCr of 5.42 mg/dL (H)). Liver Function Tests: Recent Labs  Lab 08/29/20 1417  AST 53*  ALT 32  ALKPHOS 151*  BILITOT 0.9  PROT 6.9  ALBUMIN 3.3*   No results for input(s): LIPASE, AMYLASE in the last 168 hours. No results for input(s): AMMONIA in the last 168 hours. Coagulation Profile: Recent Labs  Lab 08/29/20 1418  INR 1.1   Cardiac Enzymes: No results for input(s): CKTOTAL, CKMB, CKMBINDEX, TROPONINI in the last 168 hours. BNP (last 3 results) No results for input(s): PROBNP in the last 8760 hours. HbA1C: No results for input(s): HGBA1C in the last 72 hours. CBG: No results for input(s): GLUCAP in the last 168 hours. Lipid Profile: No results for input(s): CHOL, HDL, LDLCALC, TRIG, CHOLHDL, LDLDIRECT in the last 72 hours. Thyroid Function Tests: No results for input(s): TSH, T4TOTAL, FREET4, T3FREE, THYROIDAB in the last 72 hours. Anemia Panel: No results for input(s): VITAMINB12, FOLATE, FERRITIN, TIBC, IRON, RETICCTPCT in the last 72 hours. Urine analysis: No results found for: COLORURINE, APPEARANCEUR, LABSPEC, PHURINE, GLUCOSEU, HGBUR, BILIRUBINUR, KETONESUR, PROTEINUR, UROBILINOGEN, NITRITE, LEUKOCYTESUR  Radiological Exams on Admission: No results found.  EKG: Independently reviewed.   Assessment/Plan Active Problems:   Dilated cardiomyopathy secondary to alcohol [facility])   Essential hypertension   Obesity   GI bleed   Acute renal failure superimposed on stage 3a chronic kidney disease (HCC)   Hyponatremia   Upper GI bleed Hemoglobin 8.8 on admission,  last value in EMR 15.0 in March 2012. Patient reports dark stools over the past week. Uses aspirin at baseline, denies any NSAID use. Does endorse continued alcohol use. No vomiting. Elevated BUN of 107, but may be related to renal failure. FOBT positive on physical exam by ED provider. Concern for possible gastric ulcer. Less likely esophageal varices given no hematemesis. --GI, Dr. Elnoria Howard consulted --Continue to monitor H&H every 8 hours --Protonix 40 mg IV every 12 hours --Hold home aspirin --Clear liquid diet, n.p.o. after midnight for possible endoscopic procedure --Monitor on telemetry, transfuse for hemoglobin less than 7.0 or active bleeding  Acute on chronic CKD stage IIIa Creatinine 5.42 on admission with elevated BUN of 107. Last creatinine 1.7-1.8 in June 2020. Patient denies any decreased urinary symptoms but does endorse fatigue, weakness over the past week. He has not seen his cardiologist for some time and continues on multiple medications to include Entresto, Aldactone and torsemide which could be contributing factors. --Check urine electrolytes to include urine urea, creatinine to calculated FeUrea --Check renal ultrasound --Hold home Entresto, Aldactone, torsemide --Gentle IV fluid hydration with NS at 86mL/hr x  1 days; given history of CHF --Avoid nephrotoxins, renally dose all medications --Follow renal function closely daily --Discussed case with nephrology, Dr. Juel Burrow who will evaluate for further recommendations  Hyponatremia, suspect hypovolemic Sodium 128 on presentation. Etiology likely secondary to thiazide/loop diuretics with con commitment renal failure as above --Check urine sodium, urine osmolality --Gentle IV fluid hydration with NS at 75 mL's per hour --Check sodium level every 8 hours overnight --Holding home Aldactone, Entresto torsemide as above --Repeat BMP in the a.m.  Chronic combined systolic/diastolic congestive heart failure, compensated Last TTE 623  2020 with LVEF 20-25%, severe LV dilation, diastolic dysfunction, RV severely reduced systolic function. On carvedilol 25 mg p.o. twice daily, Entresto 97-1 03 mg twice daily, Aldactone 25 mg daily, torsemide 40 mg daily at home. --Hold home antihypertensives/diuretics secondary to acute renal failure and borderline hypotension. --Strict I's and O's and daily weights  Continued EtOH abuse Patient continues to endorse 3-4 beers daily. He denies any history of withdrawal symptoms, although patient denies going greater than a few days without any alcohol. --CIWAA protocol with symptom triggered Ativan  Sublingual nodules Noted sublingual nodules, patient reports no increase in size, nonpainful. --Recommend outpatient follow-up with dental versus ENT  Morbid obesity Body mass index is 41.04 kg/m. Discussed with patient need for aggressive lifestyle changes/weight loss as this complicates all facets of care.     DVT prophylaxis: SCDs, chemical DVT prophylaxis contraindicated in acute GI bleed Code Status: Full code Family Communication: Updated patient at bedside, patient declined communication with family members at this time Disposition Plan: Home when medically stable Consults called: GI, Dr. Elnoria Howard, Nephrology Dr. Juel Burrow Admission status: Inpatient   Severity of Illness: The appropriate patient status for this patient is INPATIENT. Inpatient status is judged to be reasonable and necessary in order to provide the required intensity of service to ensure the patient's safety. [name] presenting symptoms, physical exam findings, and initial radiographic and laboratory data in the context of their chronic comorbidities is felt to place them at high risk for further clinical deterioration. Furthermore, it is not anticipated that the patient will be medically stable for discharge from the hospital within 2 midnights of admission. The following factors support the patient status of inpatient.    " [name] presenting symptoms include weakness, fatigue, dizziness " The worrisome physical exam findings include hypotension " The initial radiographic and laboratory data are worrisome because of elevated creatinine 5.42, elevated BUN 107, sodium 128 hemoglobin 8.8. " The chronic co-morbidities include chronic diastolic/systolic CHF, obesity, gout, CKD stage IIIa.   * I certify that at the point of admission it is my clinical judgment that the patient will require inpatient hospital care spanning beyond 2 midnights from the point of admission due to high intensity of service, high risk for further deterioration and high frequency of surveillance required.*    Alvira Philips [place] DO [facility] Available via Epic secure chat 7am-7pm After these hours, please refer to coverage provider listed on [email or web address] 08/29/2020, 5:28 PM

## 2020-08-30 ENCOUNTER — Inpatient Hospital Stay (HOSPITAL_COMMUNITY): Payer: 59 | Admitting: Certified Registered Nurse Anesthetist

## 2020-08-30 ENCOUNTER — Encounter (HOSPITAL_COMMUNITY)
Admission: EM | Disposition: A | Discharge status: Home / Self Care | Payer: Self-pay | Source: Home / Self Care | Attending: Internal Medicine

## 2020-08-30 ENCOUNTER — Encounter (HOSPITAL_COMMUNITY): Payer: Self-pay | Admitting: Internal Medicine

## 2020-08-30 HISTORY — PX: ESOPHAGOGASTRODUODENOSCOPY (EGD) WITH PROPOFOL: SHX5813

## 2020-08-30 HISTORY — PX: BIOPSY: SHX5522

## 2020-08-30 LAB — COMPREHENSIVE METABOLIC PANEL
ALT: 26 U/L (ref 0–44)
AST: 43 U/L — ABNORMAL HIGH (ref 15–41)
Albumin: 2.8 g/dL — ABNORMAL LOW (ref 3.5–5.0)
Alkaline Phosphatase: 116 U/L (ref 38–126)
Anion gap: 12 (ref 5–15)
BUN: 86 mg/dL — ABNORMAL HIGH (ref 6–20)
CO2: 23 mmol/L (ref 22–32)
Calcium: 8.3 mg/dL — ABNORMAL LOW (ref 8.9–10.3)
Chloride: 97 mmol/L — ABNORMAL LOW (ref 98–111)
Creatinine, Ser: 3.12 mg/dL — ABNORMAL HIGH (ref 0.61–1.24)
GFR calc Af Amer: 25 mL/min — ABNORMAL LOW (ref >60)
GFR calc non Af Amer: 21 mL/min — ABNORMAL LOW (ref >60)
Glucose, Bld: 77 mg/dL (ref 70–99)
Potassium: 3.6 mmol/L (ref 3.5–5.1)
Sodium: 132 mmol/L — ABNORMAL LOW (ref 135–145)
Total Bilirubin: 1.3 mg/dL — ABNORMAL HIGH (ref 0.3–1.2)
Total Protein: 5.9 g/dL — ABNORMAL LOW (ref 6.5–8.1)

## 2020-08-30 LAB — CBC
HCT: 24.5 % — ABNORMAL LOW (ref 39.0–52.0)
Hemoglobin: 8.6 g/dL — ABNORMAL LOW (ref 13.0–17.0)
MCH: 37.7 pg — ABNORMAL HIGH (ref 26.0–34.0)
MCHC: 35.1 g/dL (ref 30.0–36.0)
MCV: 107.5 fL — ABNORMAL HIGH (ref 80.0–100.0)
Platelets: 173 10*3/uL (ref 150–400)
RBC: 2.28 MIL/uL — ABNORMAL LOW (ref 4.22–5.81)
RDW: 14.3 % (ref 11.5–15.5)
WBC: 8.7 10*3/uL (ref 4.0–10.5)
nRBC: 0 % (ref 0.0–0.2)

## 2020-08-30 LAB — HIV ANTIBODY (ROUTINE TESTING W REFLEX): HIV Screen 4th Generation wRfx: NONREACTIVE

## 2020-08-30 LAB — FERRITIN: Ferritin: 2964 ng/mL — ABNORMAL HIGH (ref 24–336)

## 2020-08-30 LAB — IRON AND TIBC
Iron: 204 ug/dL — ABNORMAL HIGH (ref 45–182)
Saturation Ratios: 65 % — ABNORMAL HIGH (ref 17.9–39.5)
TIBC: 314 ug/dL (ref 250–450)
UIBC: 110 ug/dL

## 2020-08-30 LAB — OSMOLALITY, URINE: Osmolality, Ur: 332 mOsm/kg (ref 300–900)

## 2020-08-30 SURGERY — ESOPHAGOGASTRODUODENOSCOPY (EGD) WITH PROPOFOL
Anesthesia: Monitor Anesthesia Care

## 2020-08-30 MED ORDER — PROPOFOL 10 MG/ML IV BOLUS
INTRAVENOUS | Status: DC | PRN
  Administered 2020-08-30 (×4): 20 mg via INTRAVENOUS
  Administered 2020-08-30 (×2): 10 mg via INTRAVENOUS
  Administered 2020-08-30: 20 mg via INTRAVENOUS
  Administered 2020-08-30: 10 mg via INTRAVENOUS
  Administered 2020-08-30 (×2): 20 mg via INTRAVENOUS

## 2020-08-30 MED ORDER — SODIUM CHLORIDE 0.9 % IV BOLUS
1000.0000 mL | Freq: Once | INTRAVENOUS | Status: AC
  Administered 2020-08-30: 1000 mL via INTRAVENOUS

## 2020-08-30 MED ORDER — PHENYLEPHRINE 40 MCG/ML (10ML) SYRINGE FOR IV PUSH (FOR BLOOD PRESSURE SUPPORT)
PREFILLED_SYRINGE | INTRAVENOUS | Status: DC | PRN
  Administered 2020-08-30: 80 ug via INTRAVENOUS

## 2020-08-30 MED ORDER — PROPOFOL 10 MG/ML IV BOLUS
INTRAVENOUS | Status: AC
  Filled 2020-08-30: qty 20

## 2020-08-30 MED ORDER — EPHEDRINE SULFATE-NACL 50-0.9 MG/10ML-% IV SOSY
PREFILLED_SYRINGE | INTRAVENOUS | Status: DC | PRN
  Administered 2020-08-30 (×2): 15 mg via INTRAVENOUS

## 2020-08-30 MED ORDER — SODIUM CHLORIDE 0.9 % IV SOLN: INTRAVENOUS | Status: DC

## 2020-08-30 MED ORDER — PROPOFOL 500 MG/50ML IV EMUL
INTRAVENOUS | Status: AC
  Filled 2020-08-30: qty 50

## 2020-08-30 MED ORDER — LIDOCAINE 2% (20 MG/ML) 5 ML SYRINGE
INTRAMUSCULAR | Status: DC | PRN
  Administered 2020-08-30: 100 mg via INTRAVENOUS

## 2020-08-30 MED ORDER — PROPOFOL 500 MG/50ML IV EMUL
INTRAVENOUS | Status: DC | PRN
  Administered 2020-08-30: 100 ug/kg/min via INTRAVENOUS

## 2020-08-30 MED ORDER — TAMSULOSIN HCL 0.4 MG PO CAPS
0.4000 mg | ORAL_CAPSULE | Freq: Every day | ORAL | Status: DC
  Administered 2020-08-30 – 2020-08-31 (×2): 0.4 mg via ORAL
  Filled 2020-08-30 (×2): qty 1

## 2020-08-30 SURGICAL SUPPLY — 15 items

## 2020-08-30 NOTE — Op Note (Signed)
[facility] Patient Name: Christopher [name] Procedure Date: 08/30/2020 MRN: [id number] Attending MD: Jeani Hawking , MD Date of Birth: 04-26-1966 CSN: [id number] Age: 54 Admit Type: Inpatient Procedure:                Upper GI endoscopy Indications:              Heme positive stool, Melena Providers:                Jeani Hawking, MD, Dwain Sarna, RN, Cathlean Marseilles, RN Referring MD:              Medicines:                 Complications:            No immediate complications. Estimated Blood Loss:     Estimated blood loss: none. Estimated blood loss                            was minimal. Procedure:                Pre-Anesthesia Assessment:                           - Prior to the procedure, a History and Physical                            was performed, and patient medications and                            allergies were reviewed. [name] tolerance of                            previous anesthesia was also reviewed. The risks                            and benefits of the procedure and the sedation                            options and risks were discussed with the patient.                            All questions were answered, and informed consent                            was obtained. Prior Anticoagulants: The patient has                            taken no previous anticoagulant or antiplatelet                            agents. ASA Grade Assessment: III - A patient with                            severe systemic disease. After reviewing the risks  and benefits, the patient was deemed in                            satisfactory condition to undergo the procedure.                           - Sedation was administered by an anesthesia                            professional. Deep sedation was attained.                           After obtaining informed consent, the endoscope was                            passed under direct vision.  Throughout the                            procedure, the patient's blood pressure, pulse, and                            oxygen saturations were monitored continuously. The                            GIF-H190 ([id number] Olympus gastroscope was                            introduced through the mouth, and advanced to the                            second part of duodenum. The upper GI endoscopy was                            accomplished without difficulty. The patient                            tolerated the procedure well. Scope In: Scope Out: Findings:      One superficial esophageal ulcer with no stigmata of recent bleeding was       found at the gastroesophageal junction. The lesion was 5 mm in largest       dimension. Biopsies were taken with a cold forceps for histology.      Patchy moderate inflammation characterized by erythema was found in the       gastric body and in the gastric antrum. Biopsies were taken with a cold       forceps for Helicobacter pylori testing.      One non-bleeding cratered and superficial duodenal ulcer with no       stigmata of bleeding was found in the duodenal bulb. The lesion was 4 mm       in largest dimension.      An unusual ulceration was identified just distal to the GE junction.       Mildly rolled edges were identified and there was a linear clean-based       ulcer. This did not appear to be reflux related in that it was  distal to       the GE junction. Multiple cold biopsies were obtained. Impression:               - Esophageal ulcer with no stigmata of recent                            bleeding. Biopsied.                           - Gastritis. Biopsied.                           - Non-bleeding duodenal ulcer with no stigmata of                            bleeding. Moderate Sedation:      Not Applicable - Patient had care per Anesthesia. Recommendation:           - Return patient to hospital ward for ongoing care.                           -  Cardiac diet.                           - Continue present medications.                           - Await pathology results.                           - PPI BID x 1 month and then QD.                           - Follow HGB and transfuse if necessary. Procedure Code(s):        --- Professional ---                           [id number], Esophagogastroduodenoscopy, flexible,                            transoral; with biopsy, single or multiple Diagnosis Code(s):        --- Professional ---                           K22.10, Ulcer of esophagus without bleeding                           K29.70, Gastritis, unspecified, without bleeding                           K26.9, Duodenal ulcer, unspecified as acute or                            chronic, without hemorrhage or perforation                           R19.5, Other fecal abnormalities  K92.1, Melena (includes Hematochezia) CPT copyright 2019 American Medical Association. All rights reserved. The codes documented in this report are preliminary and upon coder review may  be revised to meet current compliance requirements. Jeani Hawking, MD Jeani Hawking, MD 08/30/2020 1:50:53 PM This report has been signed electronically. Number of Addenda: 0

## 2020-08-30 NOTE — Transfer of Care (Signed)
Immediate Anesthesia Transfer of Care Note  Patient: Christopher [name]  Procedure(s) Performed: ESOPHAGOGASTRODUODENOSCOPY (EGD) WITH PROPOFOL (N/A ) BIOPSY  Patient Location: Endoscopy Unit  Anesthesia Type:MAC  Level of Consciousness: drowsy and patient cooperative  Airway & Oxygen Therapy: Patient Spontanous Breathing and Patient connected to nasal cannula oxygen  Post-op Assessment: Report given to RN and Post -op Vital signs reviewed and stable  Post vital signs: Reviewed and stable  Last Vitals:  Vitals Value Taken Time  BP 97/47 08/30/20 1356  Temp    Pulse 86 08/30/20 1356  Resp 17 08/30/20 1358  SpO2 100 % 08/30/20 1356  Vitals shown include unvalidated device data.  Last Pain:  Vitals:   08/30/20 1355  TempSrc: (P) Axillary  PainSc:          Complications: No complications documented.

## 2020-08-30 NOTE — Progress Notes (Addendum)
Nephrology Follow-Up Consult note   Assessment/Recommendations: Christopher [name] is a/an 54 y.o. male with a past medical history significant for CHF (EF 20 to 25%) 2/2 alcohol associated dilated cardiomyopathy, alcohol use disorder (current user), severe pulmonary hypertension, obesity, CKD admitted for GI bleeding and AKI.     Severe nonoliguric AKI on CKD: Baseline creatinine around 1.7.  AKI likely secondary to volume depletion and possible urinary retention.  Renal ultrasound unremarkable except for large postvoid residual of 250 cc.  Creatinine significantly improved today from 5.4-3.1 with hydration -Continue with intermittent hydration as needed -Continue holding Entresto, Aldactone, torsemide -Management of anemia as below -Bladder scans every 8 hours and in and out catheterization if needed for high PVR -Start Flomax for your urinary retention; may be bladder atonia from alcohol so could consider stopping if hypotension is an issue -Urinalysis bland -Consider addition of midodrine if hypotension persists -Continue to monitor daily Cr, Dose meds for GFR -Monitor Daily I/Os, Daily weight  -Maintain MAP>65 for optimal renal perfusion.  -Avoid nephrotoxic medications including NSAIDs and Vanc/Zosyn combo -Currently no indication for HD  Urinary Retention: noticed on RUS. No obvious hydro. Started flomax.  Anemia: Concern for upper GI bleed.  Hgb stable around 8-9. EGD today. Will check ferritin and iron studies.   Hyponatremia: Likely hypovolemic.  Possible reset osmostat given chronic alcohol use disorder.  Has improved today with a sodium of 132. continue to monitor  CHF: Decreased EF likely associated with alcohol.  Holding home medications given hypotension.  Continue to monitor  Alcohol use disorder: Counseled on cessation   Recommendations conveyed to primary service.    Darnell Level [facility] 08/30/2020 1:21  PM  ___________________________________________________________  CC: AKI  Interval History/Subjective: Patient states he feels well s/p EGD. Denies dizziness, lightheadedness, n/v, cp, and SOB. No edema.   Medications:  Current Facility-Administered Medications  Medication Dose Route Frequency Provider Last Rate Last Admin  . 0.9 %  sodium chloride infusion   Intravenous Continuous [place], Eric J, DO 20 mL/hr at 08/30/20 1309 Continued from Pre-op at 08/30/20 1309  . 0.9 %  sodium chloride infusion   Intravenous Continuous Jeani Hawking, MD 20 mL/hr at 08/30/20 0729 New Bag at 08/30/20 0729  . [MAR Hold] acetaminophen (TYLENOL) tablet 650 mg  650 mg Oral Q6H PRN [place], Alvira Philips, DO       Or  . [MAR Hold] acetaminophen (TYLENOL) suppository 650 mg  650 mg Rectal Q6H PRN [place], Eric J, DO      . [MAR Hold] folic acid (FOLVITE) tablet 1 mg  1 mg Oral Daily [place], Alvira Philips, DO   1 mg at 08/30/20 0925  . influenza vac split quadrivalent PF (FLUARIX) injection 0.5 mL  0.5 mL Intramuscular Tomorrow-1000 Sabino Donovan, MD      . Mitzi Hansen Hold] LORazepam (ATIVAN) tablet 1-4 mg  1-4 mg Oral Q1H PRN [place], Eric J, DO       Or  . Mitzi Hansen Hold] LORazepam (ATIVAN) injection 1-4 mg  1-4 mg Intravenous Q1H PRN [place], Eric J, DO      . [MAR Hold] multivitamin with minerals tablet 1 tablet  1 tablet Oral Daily [place], Eric J, DO   1 tablet at 08/30/20 0925  . [MAR Hold] ondansetron (ZOFRAN) tablet 4 mg  4 mg Oral Q6H PRN [place], Alvira Philips, DO       Or  . [MAR Hold] ondansetron [facility]) injection 4 mg  4 mg Intravenous Q6H PRN [place],  Alvira Philips, DO      . [MAR Hold] pantoprazole (PROTONIX) injection 40 mg  40 mg Intravenous Q12H [place], Alvira Philips, DO   40 mg at 08/30/20 0924  . [MAR Hold] polyethylene glycol (MIRALAX / GLYCOLAX) packet 17 g  17 g Oral Daily PRN [place], Alvira Philips, DO      . [MAR Hold] tamsulosin (FLOMAX) capsule 0.4 mg  0.4 mg Oral Daily Darnell Level, MD   0.4 mg at 08/30/20 0925  . [MAR Hold]  thiamine tablet 100 mg  100 mg Oral Daily [place], Alvira Philips, DO   100 mg at 08/29/20 2123   Or  . [MAR Hold] thiamine (B-1) injection 100 mg  100 mg Intravenous Daily [place], Alvira Philips, DO   100 mg at 08/30/20 4650   Facility-Administered Medications Ordered in Other Encounters  Medication Dose Route Frequency Provider Last Rate Last Admin  . lidocaine 2% (20 mg/mL) 5 mL syringe   Intravenous Anesthesia Intra-op Epimenio Sarin, CRNA   100 mg at 08/30/20 1318  . propofol (DIPRIVAN) 10 mg/mL bolus/IV push   Intravenous Anesthesia Intra-op Epimenio Sarin, CRNA   10 mg at 08/30/20 1320      Review of Systems: 10 systems reviewed and negative except per interval history/subjective  Physical Exam: Vitals:   08/30/20 0841 08/30/20 1201  BP: (!) 94/57 (!) 101/55  Pulse: 74   Resp: 16 15  Temp: 97.8 F (36.6 C) 97.8 F (36.6 C)  SpO2: 100% 98%   Total I/O In: 776.2 [I.V.:776.2] Out: 550 [Urine:550]  Intake/Output Summary (Last 24 hours) at 08/30/2020 1321 Last data filed at 08/30/2020 1050 Gross per 24 hour  Intake 776.15 ml  Output 1450 ml  Net -673.85 ml   Constitutional: well-appearing, no acute distress ENMT: ears and nose without scars or lesions, MMM CV: normal rate, no edema Respiratory: bilateral chest rise, normal work of breathing Gastrointestinal: soft, non-tender, no palpable masses or hernias Skin: no visible lesions or rashes Psych: alert, judgement/insight appropriate, appropriate mood and affect   Test Results I personally reviewed new and old clinical labs and radiology tests Lab Results  Component Value Date   NA 132 (L) 08/30/2020   K 3.6 08/30/2020   CL 97 (L) 08/30/2020   CO2 23 08/30/2020   BUN 86 (H) 08/30/2020   CREATININE 3.12 (H) 08/30/2020   CALCIUM 8.3 (L) 08/30/2020   ALBUMIN 2.8 (L) 08/30/2020   PHOS 3.9 08/29/2020

## 2020-08-30 NOTE — Progress Notes (Signed)
Dr. Allena Katz paged. Clarified order of 1000 ml NS Bolus as pt has history of CHF. Per MD, may give dose over 2 hours.

## 2020-08-30 NOTE — Progress Notes (Signed)
[facility] Progress Note  Patient: Christopher [name]    JTT:[id number]  DOA: 08/29/2020     Date of Service: the patient was seen and examined on 08/30/2020  Brief hospital course: Christopher [name] is a 54 y.o. male with medical history significant of chronic combined diastolic/systolic congestive heart failure, severe pulmonary hypertension, alcohol related dilated cardiomyopathy, CKD stage IIIa, morbid obesity, gout who presented to [facility] long ED after PCP visit for anemia. Currently plan is monitor Hb.  Assessment and Plan: Upper GI bleed Hemoglobin 8.8 on admission, last value in EMR 15.0 in March 2012. Patient reports dark stools over the past week.  Uses aspirin at baseline, denies any NSAID use.  continued alcohol use. No vomiting.  Elevated BUN of 107, but may be related to renal failure.  FOBT positive GI, Dr. Elnoria Howard consulted Protonix 40 mg IV every 12 hours Hold home aspirin Underwent EGD.  Found to have esophageal ulcer without any stigmata of, gastritis and nonbleeding duodenal ulcer. GI recommend PPI twice daily for 1 month and then every day. Monitor H&H overnight.  Acute on chronic CKD stage IIIa Creatinine 5.42 on admission with elevated BUN of 107.  Last creatinine 1.7-1.8 in June 2020.  continues on multiple medications to include Entresto, Aldactone and torsemide which could be contributing factors. Renal function improving. Nephrology consulted. Flomax started. Avoid nephrotoxic medications.  Hyponatremia, suspect hypovolemic Sodium 128 on presentation. Etiology likely secondary to thiazide/loop diuretics with renal failure as well as alcohol abuse. Currently sodium improving.  Monitor.  Chronic combined systolic/diastolic congestive heart failure, compensated Last TTE 623 2020 with LVEF 20-25%, severe LV dilation, diastolic dysfunction, RV severely reduced systolic function.  On carvedilol 25 mg p.o. twice daily, Entresto 97-1 03 mg twice daily, Aldactone  25 mg daily, torsemide 40 mg daily at home. Hold home antihypertensives/diuretics secondary to acute renal failure and borderline hypotension. Strict I's and O's and daily weights  Continued EtOH abuse 3-4 beers daily.  He denies any history of withdrawal symptoms, although patient denies going greater than a few days without any alcohol. CIWA protocol with symptom triggered Ativan  Sublingual nodules Noted sublingual nodules, patient reports no increase in size, nonpainful. Recommend outpatient follow-up with dental versus ENT  Morbid obesity Discussed with patient need for aggressive lifestyle changes/weight loss as this complicates all facets of care.  Body mass index is 41.04 kg/m.   Diet: soft diet DVT Prophylaxis: SCDs Start: 08/29/20 2056   Advance goals of care discussion: Full code  Family Communication: no family was present at bedside, at the time of interview.   Disposition:  Status is: Inpatient  Remains inpatient appropriate because:Ongoing diagnostic testing needed not appropriate for outpatient work up  Dispo: The patient is from: Home              Anticipated d/c is to: Home              Anticipated d/c date is: 1 day              Patient currently is not medically stable to d/c.  Subjective: Denies any acute complaint.  No nausea no vomiting.  No fever no chills.  No abdominal pain.  Physical Exam:  General: Appear in mild distress, no Rash; Oral Mucosa Clear, moist. no Abnormal Neck Mass Or lumps, Conjunctiva normal  Cardiovascular: S1 and S2 Present, no Murmur, Respiratory: good respiratory effort, Bilateral Air entry present and CTA, no Crackles, no wheezes Abdomen: Bowel Sound present, Soft and  no tenderness Extremities: no Pedal edema Neurology: alert and oriented to time and place affect appropriate. no new focal deficit Gait not checked due to patient safety concerns  Vitals:   08/30/20 1400 08/30/20 1410 08/30/20 1444 08/30/20 1654  BP:  (!) 92/46 (!) 93/45 95/60 (!) 89/59  Pulse: 83 78 81 96  Resp: 17 16 14 18   Temp:   97.7 F (36.5 C)   TempSrc:   Oral   SpO2: 96% 98% 100% 100%  Weight:      Height:        Intake/Output Summary (Last 24 hours) at 08/30/2020 1947 Last data filed at 08/30/2020 1831 Gross per 24 hour  Intake 2504.34 ml  Output 1960 ml  Net 544.34 ml   Filed Weights   08/29/20 1335 08/30/20 1201  Weight: 129.7 kg 129.7 kg    Data Reviewed: I have personally reviewed and interpreted daily labs, tele strips, imagings as discussed above. I reviewed all nursing notes, pharmacy notes, vitals, pertinent old records I have discussed plan of care as described above with RN and patient/family.  CBC: Recent Labs  Lab 08/29/20 1417 08/29/20 2140 08/30/20 0512  WBC 9.9  --  8.7  HGB 8.8* 9.4* 8.6*  HCT 25.1* 25.7* 24.5*  MCV 108.2*  --  107.5*  PLT 177  --  173   Basic Metabolic Panel: Recent Labs  Lab 08/29/20 1417 08/29/20 2140 08/30/20 0512  NA 128* 134* 132*  K 5.0  --  3.6  CL 90*  --  97*  CO2 24  --  23  GLUCOSE 119*  --  77  BUN 107*  --  86*  CREATININE 5.42*  --  3.12*  CALCIUM 8.8*  --  8.3*  MG  --  1.7  --   PHOS  --  3.9  --     Studies: 09/01/20 RENAL  Result Date: 08/29/2020 CLINICAL DATA:  54 year old male with renal failure. EXAM: RENAL / URINARY TRACT ULTRASOUND COMPLETE COMPARISON:  Report of CT Abdomen and Pelvis 05/10/2007 (no images available). FINDINGS: Right Kidney: Renal measurements: 13.3 x 5.8 x 5.2 cm = volume: 209 mL. Cortical echogenicity within normal limits. No evidence of right hydronephrosis or renal lesion (image 31). Left Kidney: Renal measurements: 13.8 x 6.6 x 4.8 cm = volume: 230 mL. Cortical echogenicity within normal limits. No evidence of left hydronephrosis or renal lesion (image 81). Bladder: Appears normal for degree of bladder distention. Both ureteral jets detected with Doppler. Prevoid bladder volume estimated at 586 mL, with a large postvoid  residual of 250 mL. Other: Evidence of extensive shadowing gallstones in the region of the gallbladder (image 58). IMPRESSION: 1. Bladder remarkable for large postvoid residual, query urinary retention. 2. However, normal ultrasound appearance of both kidneys. 3. Evidence of cholelithiasis. Electronically Signed   By: 05/12/2007 M.D.   On: 08/29/2020 20:04    Scheduled Meds:  folic acid  1 mg Oral Daily   influenza vac split quadrivalent PF  0.5 mL Intramuscular Tomorrow-1000   multivitamin with minerals  1 tablet Oral Daily   pantoprazole (PROTONIX) IV  40 mg Intravenous Q12H   tamsulosin  0.4 mg Oral Daily   thiamine  100 mg Oral Daily   Or   thiamine  100 mg Intravenous Daily   Continuous Infusions:  sodium chloride 75 mL/hr at 08/30/20 1813   PRN Meds: acetaminophen **OR** acetaminophen, LORazepam **OR** LORazepam, ondansetron **OR** ondansetron (ZOFRAN) IV, polyethylene glycol  Time spent: 35 minutes  Author: Lynden Oxford, MD [facility] 08/30/2020 7:47 PM  To reach On-call, see care teams to locate the attending and reach out via [email or web address]. Between 7PM-7AM, please contact night-coverage If you still have difficulty reaching the attending provider, please page the [facility] (Director on Call) for [facility] on amion for assistance.

## 2020-08-30 NOTE — Anesthesia Preprocedure Evaluation (Addendum)
Anesthesia Evaluation  Patient identified by MRN, date of birth, ID band Patient awake    Reviewed: Allergy & Precautions, NPO status , Patient's Chart, lab work & pertinent test results  Airway Mallampati: II  TM Distance: >3 FB Neck ROM: Full    Dental no notable dental hx. (+) Teeth Intact, Dental Advisory Given   Pulmonary neg pulmonary ROS, former smoker,    Pulmonary exam normal breath sounds clear to auscultation       Cardiovascular hypertension, Pt. on medications Normal cardiovascular exam Rhythm:Regular Rate:Normal     Neuro/Psych negative neurological ROS  negative psych ROS   GI/Hepatic negative GI ROS, Neg liver ROS,   Endo/Other  negative endocrine ROS  Renal/GU negative Renal ROS     Musculoskeletal negative musculoskeletal ROS (+)   Abdominal   Peds  Hematology negative hematology ROS (+)   Anesthesia Other Findings   Reproductive/Obstetrics negative OB ROS                             Anesthesia Physical Anesthesia Plan  ASA: III  Anesthesia Plan: MAC   Post-op Pain Management:    Induction:   PONV Risk Score and Plan: Treatment may vary due to age or medical condition  Airway Management Planned: Nasal Cannula and Natural Airway  Additional Equipment:   Intra-op Plan:   Post-operative Plan:   Informed Consent:     Dental advisory given  Plan Discussed with: CRNA and Anesthesiologist  Anesthesia Plan Comments:         Anesthesia Quick Evaluation

## 2020-08-30 NOTE — ED Notes (Signed)
ED TO INPATIENT HANDOFF REPORT  Name/Age/Gender Christopher [name] 53 y.o. male  Code Status    Code Status Orders  (From admission, onward)         Start     Ordered   08/29/20 2056  Full code  Continuous        08/29/20 2056        Code Status History    This patient has a current code status but no historical code status.   Advance Care Planning Activity      Home/SNF/Other Home  Chief Complaint GI bleed [K92.2]  Level of Care/Admitting Diagnosis ED Disposition    ED Disposition Condition Comment   Admit  Hospital Area: [facility] [[id number]]  Level of Care: Telemetry [5]  Admit to tele based on following criteria: Monitor for Ischemic changes  May admit patient to [facility] if equivalent level of care is available:: Yes  Covid Evaluation: Confirmed COVID Negative  Diagnosis: GI bleed [161096]  Admitting Physician: [place], ERIC J [[id number]]  Attending Physician: [place], ERIC J [[id number]]  Estimated length of stay: past midnight tomorrow  Certification:: I certify this patient will need inpatient services for at least 2 midnights       Medical History Past Medical History:  Diagnosis Date  . CHF (congestive heart failure) (HCC)   . Gout   . Hypertension     Allergies No Known Allergies  IV Location/Drains/Wounds Patient Lines/Drains/Airways Status    Active Line/Drains/Airways    Name Placement date Placement time Site Days   Peripheral IV 08/29/20 Left Antecubital 08/29/20  1415  Antecubital  1   Peripheral IV 08/29/20 Right Wrist 08/29/20  1431  Wrist  1          Labs/Imaging Results for orders placed or performed during the hospital encounter of 08/29/20 (from the past 48 hour(s))  Comprehensive metabolic panel     Status: Abnormal   Collection Time: 08/29/20  2:17 PM  Result Value Ref Range   Sodium 128 (L) 135 - 145 mmol/L   Potassium 5.0 3.5 - 5.1 mmol/L   Chloride 90 (L) 98 - 111 mmol/L   CO2 24 22 -  32 mmol/L   Glucose, Bld 119 (H) 70 - 99 mg/dL    Comment: Glucose reference range applies only to samples taken after fasting for at least 8 hours.   BUN 107 (H) 6 - 20 mg/dL    Comment: RESULTS CONFIRMED BY MANUAL DILUTION   Creatinine, Ser 5.42 (H) 0.61 - 1.24 mg/dL   Calcium 8.8 (L) 8.9 - 10.3 mg/dL   Total Protein 6.9 6.5 - 8.1 g/dL   Albumin 3.3 (L) 3.5 - 5.0 g/dL   AST 53 (H) 15 - 41 U/L   ALT 32 0 - 44 U/L   Alkaline Phosphatase 151 (H) 38 - 126 U/L   Total Bilirubin 0.9 0.3 - 1.2 mg/dL   GFR calc non Af Amer 11 (L) >60 mL/min   GFR calc Af Amer 13 (L) >60 mL/min   Anion gap 14 5 - 15    Comment: Performed at [facility], [address], [place], [place] [address]  CBC     Status: Abnormal   Collection Time: 08/29/20  2:17 PM  Result Value Ref Range   WBC 9.9 4.0 - 10.5 K/uL   RBC 2.32 (L) 4.22 - 5.81 MIL/uL   Hemoglobin 8.8 (L) 13.0 - 17.0 g/dL   HCT 13.0 (L)  39 - 52 %   MCV 108.2 (H) 80.0 - 100.0 fL   MCH 37.9 (H) 26.0 - 34.0 pg   MCHC 35.1 30.0 - 36.0 g/dL   RDW 16.114.6 09.611.5 - 04.515.5 %   Platelets 177 150 - 400 K/uL   nRBC 0.0 0.0 - 0.2 %    Comment: Performed at [facility], [address] [place], [place] [address]  Type and screen [facility]     Status: None   Collection Time: 08/29/20  2:17 PM  Result Value Ref Range   ABO/RH(D) A POS    Antibody Screen NEG    Sample Expiration      09/01/2020,2359 Performed at [facility], [address] [place], [place] [address]   SARS Coronavirus 2 by RT PCR (hospital order, performed in [facility] hospital lab) Nasopharyngeal Nasopharyngeal Swab     Status: None   Collection Time: 08/29/20  2:17 PM   Specimen: Nasopharyngeal Swab  Result Value Ref Range   SARS Coronavirus 2 NEGATIVE NEGATIVE    Comment: (NOTE) SARS-CoV-2 target nucleic acids are NOT DETECTED.  The SARS-CoV-2 RNA is generally detectable in upper and lower respiratory  specimens during the acute phase of infection. The lowest concentration of SARS-CoV-2 viral copies this assay can detect is 250 copies / mL. A negative result does not preclude SARS-CoV-2 infection and should not be used as the sole basis for treatment or other patient management decisions.  A negative result may occur with improper specimen collection / handling, submission of specimen other than nasopharyngeal swab, presence of viral mutation(s) within the areas targeted by this assay, and inadequate number of viral copies (<250 copies / mL). A negative result must be combined with clinical observations, patient history, and epidemiological information.  Fact Sheet for Patients:   BoilerBrush.com.cyhttps://www.fda.gov/media/136312/download  Fact Sheet for Healthcare Providers: https://pope.com/https://www.fda.gov/media/136313/download  This test is not yet approved or  cleared by the [place] FDA and has been authorized for detection and/or diagnosis of SARS-CoV-2 by FDA under an Emergency Use Authorization (EUA).  This EUA will remain in effect (meaning this test can be used) for the duration of the COVID-19 declaration under Section 564(b)(1) of the Act, 21 U.S.C. section 360bbb-3(b)(1), unless the authorization is terminated or revoked sooner.  Performed at [facility], [address] [place], [place] [address]   TSH     Status: None   Collection Time: 08/29/20  2:17 PM  Result Value Ref Range   TSH 1.515 0.350 - 4.500 uIU/mL    Comment: Performed by a 3rd Generation assay with a functional sensitivity of <=0.01 uIU/mL. Performed at [facility], [address] [place], [place] [address]   HIV Antibody (routine testing w rflx)     Status: None   Collection Time: 08/29/20  2:17 PM  Result Value Ref Range   HIV Screen 4th Generation wRfx Non Reactive Non Reactive    Comment: Performed at [facility], [address] [place], [place] [address]  APTT      Status: None   Collection Time: 08/29/20  2:18 PM  Result Value Ref Range   aPTT 30 24 - 36 seconds    Comment: Performed at [facility], [address] [place], [place] [address]  Protime-INR     Status: None   Collection Time: 08/29/20  2:18 PM  Result Value Ref Range   Prothrombin Time 13.5 11.4 - 15.2  seconds   INR 1.1 0.8 - 1.2    Comment: (NOTE) INR goal varies based on device and disease states. Performed at [facility], [address] [place], [place] [address]   ABO/Rh     Status: None   Collection Time: 08/29/20  2:25 PM  Result Value Ref Range   ABO/RH(D)      A POS Performed at [facility], [address], [place], [place] [address]   POC occult blood, ED     Status: Abnormal   Collection Time: 08/29/20  2:33 PM  Result Value Ref Range   Fecal Occult Bld POSITIVE (A) NEGATIVE  Sodium, urine, random     Status: None   Collection Time: 08/29/20  7:12 PM  Result Value Ref Range   Sodium, Ur 44 mmol/L    Comment: Performed at [facility], [address] [place], [place] [address]  Creatinine, urine, random     Status: None   Collection Time: 08/29/20  7:12 PM  Result Value Ref Range   Creatinine, Urine 130.65 mg/dL    Comment: Performed at [facility], [address], [place], [place] [address]  Urinalysis, Routine w reflex microscopic     Status: None   Collection Time: 08/29/20  7:12 PM  Result Value Ref Range   Color, Urine YELLOW YELLOW   APPearance CLEAR CLEAR   Specific Gravity, Urine 1.010 1.005 - 1.030   pH 5.0 5.0 - 8.0   Glucose, UA NEGATIVE NEGATIVE mg/dL   Hgb urine dipstick NEGATIVE NEGATIVE   Bilirubin Urine NEGATIVE NEGATIVE   Ketones, ur NEGATIVE NEGATIVE mg/dL   Protein, ur NEGATIVE NEGATIVE mg/dL   Nitrite NEGATIVE NEGATIVE   Leukocytes,Ua NEGATIVE NEGATIVE    Comment: Performed at [facility], [address],  [place], [place] [address]  Osmolality, urine     Status: None   Collection Time: 08/29/20  7:12 PM  Result Value Ref Range   Osmolality, Ur 332 300 - 900 mOsm/kg    Comment: Performed at [facility], [address], [place], [place] [address]  Magnesium     Status: None   Collection Time: 08/29/20  9:40 PM  Result Value Ref Range   Magnesium 1.7 1.7 - 2.4 mg/dL    Comment: Performed at [facility], [address] [place], [place] [address]  Phosphorus     Status: None   Collection Time: 08/29/20  9:40 PM  Result Value Ref Range   Phosphorus 3.9 2.5 - 4.6 mg/dL    Comment: Performed at [facility], [address] [place], [place] [address]  Hemoglobin and hematocrit, blood     Status: Abnormal   Collection Time: 08/29/20  9:40 PM  Result Value Ref Range   Hemoglobin 9.4 (L) 13.0 - 17.0 g/dL   HCT 66.5 (L) 39 - 52 %    Comment: Performed at [facility] [address], [place], [place] [address]  Sodium     Status: Abnormal   Collection Time: 08/29/20  9:40 PM  Result Value Ref Range   Sodium 134 (L) 135 - 145 mmol/L    Comment: Performed at [facility], [address], [place], [place] [address]  Comprehensive metabolic panel     Status: Abnormal   Collection Time: 08/30/20  5:12 AM  Result Value Ref Range   Sodium 132 (L) 135 - 145 mmol/L   Potassium  3.6 3.5 - 5.1 mmol/L    Comment: DELTA CHECK NOTED   Chloride 97 (L) 98 - 111 mmol/L   CO2 23 22 - 32 mmol/L   Glucose, Bld 77 70 - 99 mg/dL    Comment: Glucose reference range applies only to samples taken after fasting for at least 8 hours.   BUN 86 (H) 6 - 20 mg/dL   Creatinine, Ser 3.50 (H) 0.61 - 1.24 mg/dL    Comment: DELTA CHECK NOTED   Calcium 8.3 (L) 8.9 - 10.3 mg/dL   Total Protein 5.9 (L) 6.5 - 8.1 g/dL   Albumin 2.8 (L) 3.5 - 5.0 g/dL   AST 43 (H) 15 - 41 U/L   ALT 26 0 - 44 U/L   Alkaline Phosphatase 116 38 - 126 U/L   Total  Bilirubin 1.3 (H) 0.3 - 1.2 mg/dL   GFR calc non Af Amer 21 (L) >60 mL/min   GFR calc Af Amer 25 (L) >60 mL/min   Anion gap 12 5 - 15    Comment: Performed at [facility], [address] [place], [place] [address]  CBC     Status: Abnormal   Collection Time: 08/30/20  5:12 AM  Result Value Ref Range   WBC 8.7 4.0 - 10.5 K/uL   RBC 2.28 (L) 4.22 - 5.81 MIL/uL   Hemoglobin 8.6 (L) 13.0 - 17.0 g/dL   HCT 82.9 (L) 39 - 52 %   MCV 107.5 (H) 80.0 - 100.0 fL   MCH 37.7 (H) 26.0 - 34.0 pg   MCHC 35.1 30.0 - 36.0 g/dL   RDW 93.7 16.9 - 67.8 %   Platelets 173 150 - 400 K/uL   nRBC 0.0 0.0 - 0.2 %    Comment: Performed at [facility], [address] [place], [place] [address]   US RENAL  Result Date: 08/29/2020 CLINICAL DATA:  54 year old male with renal failure. EXAM: RENAL / URINARY TRACT ULTRASOUND COMPLETE COMPARISON:  Report of CT Abdomen and Pelvis 05/10/2007 (no images available). FINDINGS: Right Kidney: Renal measurements: 13.3 x 5.8 x 5.2 cm = volume: 209 mL. Cortical echogenicity within normal limits. No evidence of right hydronephrosis or renal lesion (image 31). Left Kidney: Renal measurements: 13.8 x 6.6 x 4.8 cm = volume: 230 mL. Cortical echogenicity within normal limits. No evidence of left hydronephrosis or renal lesion (image 81). Bladder: Appears normal for degree of bladder distention. Both ureteral jets detected with Doppler. Prevoid bladder volume estimated at 586 mL, with a large postvoid residual of 250 mL. Other: Evidence of extensive shadowing gallstones in the region of the gallbladder (image 58). IMPRESSION: 1. Bladder remarkable for large postvoid residual, query urinary retention. 2. However, normal ultrasound appearance of both kidneys. 3. Evidence of cholelithiasis. Electronically Signed   By: Odessa Fleming M.D.   On: 08/29/2020 20:04    Pending Labs Unresulted Labs (From admission, onward)          Start     Ordered   08/29/20  2056  Urea nitrogen, urine  Once,   STAT        08/29/20 2056          Vitals/Pain Today's Vitals   08/30/20 0400 08/30/20 0430 08/30/20 0500 08/30/20 0721  BP: (!) 95/59 (!) 87/57 (!) 87/58 90/60  Pulse: 80 90 72 73  Resp: 17 17 16 16   Temp:      TempSrc:      SpO2: 98% 96% 100% 100%  Weight:      Height:      PainSc:        Isolation Precautions No active isolations  Medications Medications  influenza vac split quadrivalent PF (FLUARIX) injection 0.5 mL (has no administration in time range)  LORazepam (ATIVAN) tablet 1-4 mg (has no administration in time range)    Or  LORazepam (ATIVAN) injection 1-4 mg (has no administration in time range)  thiamine tablet 100 mg (100 mg Oral Given 08/29/20 2123)    Or  thiamine (B-1) injection 100 mg ( Intravenous See Alternative 08/29/20 2123)  folic acid (FOLVITE) tablet 1 mg (1 mg Oral Given 08/29/20 2123)  multivitamin with minerals tablet 1 tablet (1 tablet Oral Given 08/29/20 2123)  0.9 %  sodium chloride infusion ( Intravenous New Bag/Given 08/29/20 2122)  acetaminophen (TYLENOL) tablet 650 mg (has no administration in time range)    Or  acetaminophen (TYLENOL) suppository 650 mg (has no administration in time range)  polyethylene glycol (MIRALAX / GLYCOLAX) packet 17 g (has no administration in time range)  ondansetron (ZOFRAN) tablet 4 mg (has no administration in time range)    Or  ondansetron (ZOFRAN) injection 4 mg (has no administration in time range)  pantoprazole (PROTONIX) injection 40 mg (40 mg Intravenous Given 08/29/20 2123)  0.9 %  sodium chloride infusion ( Intravenous New Bag/Given 08/30/20 0729)  lactated ringers bolus 1,000 mL (0 mLs Intravenous Stopped 08/29/20 1608)    Mobility walks

## 2020-08-30 NOTE — Consult Note (Signed)
Reason for Consult: Melena and anemia Referring Physician: [facility]  [name] HPI: This is a 54 year old male with a PMH of CHF with an EF ranging from 15-25%, HTN, pulmonary HTN, CKD, and ETOH abuse admitted for a symptomatic anemia.  The patient was feeling weak and fatigued, which was coincident with melenic stools that occurred for the 3-4 days prior to admission.  He denied any prior history of a GI bleed and he denied any issues with abdominal pain, nausea, vomiting, hematemesis, hematochezia, hemoptysis, diarrhea, or constipation.  His PCP evaluated the patient and he was noted to have a significant anemia at 8.8 g/dL with an MCV of 725.  His stool was heme positive.  Past Medical History:  Diagnosis Date   CHF (congestive heart failure) (HCC)    Gout    Hypertension     History reviewed. No pertinent surgical history.  Family History  Problem Relation Age of Onset   Hypertension Mother     Social History:  reports that he quit smoking about 11 years ago. His smoking use included cigarettes. He has a 1.00 pack-year smoking history. He has never used smokeless tobacco. He reports current alcohol use. He reports previous drug use.  Allergies: No Known Allergies  Medications: Scheduled:  [MAR Hold] folic acid  1 mg Oral Daily   influenza vac split quadrivalent PF  0.5 mL Intramuscular Tomorrow-1000   [MAR Hold] multivitamin with minerals  1 tablet Oral Daily   [MAR Hold] pantoprazole (PROTONIX) IV  40 mg Intravenous Q12H   [MAR Hold] tamsulosin  0.4 mg Oral Daily   [MAR Hold] thiamine  100 mg Oral Daily   Or   [MAR Hold] thiamine  100 mg Intravenous Daily   Continuous:  sodium chloride 20 mL/hr at 08/30/20 1302   sodium chloride 20 mL/hr at 08/30/20 0729    Results for orders placed or performed during the hospital encounter of 08/29/20 (from the past 24 hour(s))  Comprehensive metabolic panel     Status: Abnormal   Collection Time: 08/29/20  2:17 PM   Result Value Ref Range   Sodium 128 (L) 135 - 145 mmol/L   Potassium 5.0 3.5 - 5.1 mmol/L   Chloride 90 (L) 98 - 111 mmol/L   CO2 24 22 - 32 mmol/L   Glucose, Bld 119 (H) 70 - 99 mg/dL   BUN 366 (H) 6 - 20 mg/dL   Creatinine, Ser 4.40 (H) 0.61 - 1.24 mg/dL   Calcium 8.8 (L) 8.9 - 10.3 mg/dL   Total Protein 6.9 6.5 - 8.1 g/dL   Albumin 3.3 (L) 3.5 - 5.0 g/dL   AST 53 (H) 15 - 41 U/L   ALT 32 0 - 44 U/L   Alkaline Phosphatase 151 (H) 38 - 126 U/L   Total Bilirubin 0.9 0.3 - 1.2 mg/dL   GFR calc non Af Amer 11 (L) >60 mL/min   GFR calc Af Amer 13 (L) >60 mL/min   Anion gap 14 5 - 15  CBC     Status: Abnormal   Collection Time: 08/29/20  2:17 PM  Result Value Ref Range   WBC 9.9 4.0 - 10.5 K/uL   RBC 2.32 (L) 4.22 - 5.81 MIL/uL   Hemoglobin 8.8 (L) 13.0 - 17.0 g/dL   HCT 34.7 (L) 39 - 52 %   MCV 108.2 (H) 80.0 - 100.0 fL   MCH 37.9 (H) 26.0 - 34.0 pg   MCHC 35.1 30.0 - 36.0 g/dL  RDW 14.6 11.5 - 15.5 %   Platelets 177 150 - 400 K/uL   nRBC 0.0 0.0 - 0.2 %  Type and screen [facility]     Status: None   Collection Time: 08/29/20  2:17 PM  Result Value Ref Range   ABO/RH(D) A POS    Antibody Screen NEG    Sample Expiration      09/01/2020,2359 Performed at [facility], [address] [place], [place] [address]   SARS Coronavirus 2 by RT PCR (hospital order, performed in [facility] hospital lab) Nasopharyngeal Nasopharyngeal Swab     Status: None   Collection Time: 08/29/20  2:17 PM   Specimen: Nasopharyngeal Swab  Result Value Ref Range   SARS Coronavirus 2 NEGATIVE NEGATIVE  TSH     Status: None   Collection Time: 08/29/20  2:17 PM  Result Value Ref Range   TSH 1.515 0.350 - 4.500 uIU/mL  HIV Antibody (routine testing w rflx)     Status: None   Collection Time: 08/29/20  2:17 PM  Result Value Ref Range   HIV Screen 4th Generation wRfx Non Reactive Non Reactive  APTT     Status: None   Collection Time: 08/29/20  2:18 PM   Result Value Ref Range   aPTT 30 24 - 36 seconds  Protime-INR     Status: None   Collection Time: 08/29/20  2:18 PM  Result Value Ref Range   Prothrombin Time 13.5 11.4 - 15.2 seconds   INR 1.1 0.8 - 1.2  ABO/Rh     Status: None   Collection Time: 08/29/20  2:25 PM  Result Value Ref Range   ABO/RH(D)      A POS Performed at [facility], [address] [place], [place] [address]   POC occult blood, ED     Status: Abnormal   Collection Time: 08/29/20  2:33 PM  Result Value Ref Range   Fecal Occult Bld POSITIVE (A) NEGATIVE  Sodium, urine, random     Status: None   Collection Time: 08/29/20  7:12 PM  Result Value Ref Range   Sodium, Ur 44 mmol/L  Creatinine, urine, random     Status: None   Collection Time: 08/29/20  7:12 PM  Result Value Ref Range   Creatinine, Urine 130.65 mg/dL  Urinalysis, Routine w reflex microscopic     Status: None   Collection Time: 08/29/20  7:12 PM  Result Value Ref Range   Color, Urine YELLOW YELLOW   APPearance CLEAR CLEAR   Specific Gravity, Urine 1.010 1.005 - 1.030   pH 5.0 5.0 - 8.0   Glucose, UA NEGATIVE NEGATIVE mg/dL   Hgb urine dipstick NEGATIVE NEGATIVE   Bilirubin Urine NEGATIVE NEGATIVE   Ketones, ur NEGATIVE NEGATIVE mg/dL   Protein, ur NEGATIVE NEGATIVE mg/dL   Nitrite NEGATIVE NEGATIVE   Leukocytes,Ua NEGATIVE NEGATIVE  Osmolality, urine     Status: None   Collection Time: 08/29/20  7:12 PM  Result Value Ref Range   Osmolality, Ur 332 300 - 900 mOsm/kg  Magnesium     Status: None   Collection Time: 08/29/20  9:40 PM  Result Value Ref Range   Magnesium 1.7 1.7 - 2.4 mg/dL  Phosphorus     Status: None   Collection Time: 08/29/20  9:40 PM  Result Value Ref Range   Phosphorus 3.9 2.5 - 4.6 mg/dL  Hemoglobin and hematocrit, blood     Status: Abnormal  Collection Time: 08/29/20  9:40 PM  Result Value Ref Range   Hemoglobin 9.4 (L) 13.0 - 17.0 g/dL   HCT 31.4 (L) 39 - 52 %  Sodium     Status: Abnormal    Collection Time: 08/29/20  9:40 PM  Result Value Ref Range   Sodium 134 (L) 135 - 145 mmol/L  Comprehensive metabolic panel     Status: Abnormal   Collection Time: 08/30/20  5:12 AM  Result Value Ref Range   Sodium 132 (L) 135 - 145 mmol/L   Potassium 3.6 3.5 - 5.1 mmol/L   Chloride 97 (L) 98 - 111 mmol/L   CO2 23 22 - 32 mmol/L   Glucose, Bld 77 70 - 99 mg/dL   BUN 86 (H) 6 - 20 mg/dL   Creatinine, Ser 9.70 (H) 0.61 - 1.24 mg/dL   Calcium 8.3 (L) 8.9 - 10.3 mg/dL   Total Protein 5.9 (L) 6.5 - 8.1 g/dL   Albumin 2.8 (L) 3.5 - 5.0 g/dL   AST 43 (H) 15 - 41 U/L   ALT 26 0 - 44 U/L   Alkaline Phosphatase 116 38 - 126 U/L   Total Bilirubin 1.3 (H) 0.3 - 1.2 mg/dL   GFR calc non Af Amer 21 (L) >60 mL/min   GFR calc Af Amer 25 (L) >60 mL/min   Anion gap 12 5 - 15  CBC     Status: Abnormal   Collection Time: 08/30/20  5:12 AM  Result Value Ref Range   WBC 8.7 4.0 - 10.5 K/uL   RBC 2.28 (L) 4.22 - 5.81 MIL/uL   Hemoglobin 8.6 (L) 13.0 - 17.0 g/dL   HCT 26.3 (L) 39 - 52 %   MCV 107.5 (H) 80.0 - 100.0 fL   MCH 37.7 (H) 26.0 - 34.0 pg   MCHC 35.1 30.0 - 36.0 g/dL   RDW 78.5 88.5 - 02.7 %   Platelets 173 150 - 400 K/uL   nRBC 0.0 0.0 - 0.2 %     US RENAL  Result Date: 08/29/2020 CLINICAL DATA:  54 year old male with renal failure. EXAM: RENAL / URINARY TRACT ULTRASOUND COMPLETE COMPARISON:  Report of CT Abdomen and Pelvis 05/10/2007 (no images available). FINDINGS: Right Kidney: Renal measurements: 13.3 x 5.8 x 5.2 cm = volume: 209 mL. Cortical echogenicity within normal limits. No evidence of right hydronephrosis or renal lesion (image 31). Left Kidney: Renal measurements: 13.8 x 6.6 x 4.8 cm = volume: 230 mL. Cortical echogenicity within normal limits. No evidence of left hydronephrosis or renal lesion (image 81). Bladder: Appears normal for degree of bladder distention. Both ureteral jets detected with Doppler. Prevoid bladder volume estimated at 586 mL, with a large postvoid residual  of 250 mL. Other: Evidence of extensive shadowing gallstones in the region of the gallbladder (image 58). IMPRESSION: 1. Bladder remarkable for large postvoid residual, query urinary retention. 2. However, normal ultrasound appearance of both kidneys. 3. Evidence of cholelithiasis. Electronically Signed   By: Odessa Fleming M.D.   On: 08/29/2020 20:04    ROS:  As stated above in the HPI otherwise negative.  Blood pressure 90/60, pulse 73, temperature 98.2 F (36.8 C), temperature source Oral, resp. rate 16, height 5\' 10"  (1.778 m), weight 129.7 kg, SpO2 100 %.    PE: Gen: NAD, Alert and Oriented HEENT:  [place]/AT, EOMI Neck: Supple, no LAD Lungs: CTA Bilaterally CV: RRR without M/G/R ABD: Soft, NTND, +BS Ext: No C/C/E  Assessment/Plan: 1) Melena. 2) Anemia. 3)  CHF.  Plan: 1) EGD today.  Jaymes Hang D 08/30/2020, 7:25 AM

## 2020-08-31 ENCOUNTER — Encounter (HOSPITAL_COMMUNITY): Payer: Self-pay | Admitting: Gastroenterology

## 2020-08-31 ENCOUNTER — Other Ambulatory Visit: Payer: Self-pay

## 2020-08-31 LAB — CBC
HCT: 23.5 % — ABNORMAL LOW (ref 39.0–52.0)
Hemoglobin: 8 g/dL — ABNORMAL LOW (ref 13.0–17.0)
MCH: 38.3 pg — ABNORMAL HIGH (ref 26.0–34.0)
MCHC: 34 g/dL (ref 30.0–36.0)
MCV: 112.4 fL — ABNORMAL HIGH (ref 80.0–100.0)
Platelets: 152 10*3/uL (ref 150–400)
RBC: 2.09 MIL/uL — ABNORMAL LOW (ref 4.22–5.81)
RDW: 15 % (ref 11.5–15.5)
WBC: 7.3 10*3/uL (ref 4.0–10.5)
nRBC: 0 % (ref 0.0–0.2)

## 2020-08-31 LAB — COMPREHENSIVE METABOLIC PANEL
ALT: 30 U/L (ref 0–44)
AST: 50 U/L — ABNORMAL HIGH (ref 15–41)
Albumin: 2.8 g/dL — ABNORMAL LOW (ref 3.5–5.0)
Alkaline Phosphatase: 131 U/L — ABNORMAL HIGH (ref 38–126)
Anion gap: 12 (ref 5–15)
BUN: 62 mg/dL — ABNORMAL HIGH (ref 6–20)
CO2: 22 mmol/L (ref 22–32)
Calcium: 9 mg/dL (ref 8.9–10.3)
Chloride: 102 mmol/L (ref 98–111)
Creatinine, Ser: 2.16 mg/dL — ABNORMAL HIGH (ref 0.61–1.24)
GFR calc Af Amer: 39 mL/min — ABNORMAL LOW (ref >60)
GFR calc non Af Amer: 33 mL/min — ABNORMAL LOW (ref >60)
Glucose, Bld: 95 mg/dL (ref 70–99)
Potassium: 4.7 mmol/L (ref 3.5–5.1)
Sodium: 136 mmol/L (ref 135–145)
Total Bilirubin: 1 mg/dL (ref 0.3–1.2)
Total Protein: 6 g/dL — ABNORMAL LOW (ref 6.5–8.1)

## 2020-08-31 LAB — MAGNESIUM: Magnesium: 1.9 mg/dL (ref 1.7–2.4)

## 2020-08-31 LAB — HEMOGLOBIN AND HEMATOCRIT, BLOOD
HCT: 25.2 % — ABNORMAL LOW (ref 39.0–52.0)
Hemoglobin: 8.7 g/dL — ABNORMAL LOW (ref 13.0–17.0)

## 2020-08-31 LAB — UREA NITROGEN, URINE: Urea Nitrogen, Ur: 425 mg/dL

## 2020-08-31 LAB — SURGICAL PATHOLOGY

## 2020-08-31 MED ORDER — PANTOPRAZOLE SODIUM 40 MG PO TBEC
40.0000 mg | DELAYED_RELEASE_TABLET | Freq: Two times a day (BID) | ORAL | 1 refills | Status: DC
Start: 2020-08-31 — End: 2023-10-12

## 2020-08-31 MED ORDER — SPIRONOLACTONE 25 MG PO TABS: 25.0000 mg | ORAL_TABLET | Freq: Every morning | ORAL | Status: DC

## 2020-08-31 MED ORDER — TAMSULOSIN HCL 0.4 MG PO CAPS
0.4000 mg | ORAL_CAPSULE | Freq: Every day | ORAL | 1 refills | Status: DC
Start: 2020-09-01 — End: 2023-10-12

## 2020-08-31 MED ORDER — TORSEMIDE 20 MG PO TABS: 20.0000 mg | ORAL_TABLET | Freq: Every day | ORAL | Status: DC

## 2020-08-31 MED ORDER — POTASSIUM CHLORIDE CRYS ER 10 MEQ PO TBCR: 10.0000 meq | EXTENDED_RELEASE_TABLET | Freq: Every morning | ORAL | Status: DC

## 2020-08-31 MED ORDER — ENTRESTO 97-103 MG PO TABS: 1.0000 | ORAL_TABLET | Freq: Every morning | ORAL | Status: DC

## 2020-08-31 MED ORDER — CARVEDILOL 25 MG PO TABS: 25.0000 mg | ORAL_TABLET | Freq: Every morning | ORAL | Status: DC

## 2020-08-31 MED FILL — TAMSULOSIN HCL 0.4 MG CAP: 0.4 | 30 days supply | Qty: 30 | Fill #0

## 2020-08-31 NOTE — Progress Notes (Signed)
Patient is stable at discharge. Pt reports feeling well. Questions, concerns denied at this time. Pt encouraged to call for follow up visits today. All BP/CHF medications are on hold. Pt encouraged to monitor BP.

## 2020-08-31 NOTE — Anesthesia Postprocedure Evaluation (Signed)
Anesthesia Post Note  Patient: Christopher [name]  Procedure(s) Performed: ESOPHAGOGASTRODUODENOSCOPY (EGD) WITH PROPOFOL (N/A ) BIOPSY     Patient location during evaluation: Endoscopy Anesthesia Type: MAC Level of consciousness: awake and alert Pain management: pain level controlled Vital Signs Assessment: post-procedure vital signs reviewed and stable Respiratory status: spontaneous breathing, nonlabored ventilation, respiratory function stable and patient connected to nasal cannula oxygen Cardiovascular status: blood pressure returned to baseline and stable Postop Assessment: no apparent nausea or vomiting Anesthetic complications: no   No complications documented.  Last Vitals:  Vitals:   08/31/20 1304 08/31/20 1417  BP: 103/73   Pulse: (!) 107 (!) 102  Resp: (!) 21 16  Temp: 36.6 C   SpO2: 99%     Last Pain:  Vitals:   08/31/20 1304  TempSrc: Oral  PainSc:    Pain Goal:                   Barnet Glasgow

## 2020-08-31 NOTE — Progress Notes (Signed)
Pharmacy IV to PO conversion  The patient is ordered thiamine by the intravenous route with a linked PO option available.  Based on criteria approved by the Pharmacy and Therapeutics Committee and the Medical Executive Committee, the IV option is being discontinued.   No active GI bleeding or impaired absorption - admit w/ UGIB and +FOBT, but EGD shows nonbleeding ulcer; taking PO option already  Not s/p esophagectomy  Documented ability to take oral medications for > 24 hr  Plan to continue treatment for at least 1 day  If you have any questions about this conversion, please contact the Pharmacy Department (ext [phone]).  Thank you.  Bernadene Person, PharmD, BCPS [phone] 08/31/2020, 10:10 AM

## 2020-08-31 NOTE — Progress Notes (Signed)
Nephrology Follow-Up Consult note   Assessment/Recommendations: Christopher [name] is a/an 54 y.o. male with a past medical history significant for CHF (EF 20 to 25%) 2/2 alcohol associated dilated cardiomyopathy, alcohol use disorder (current user), severe pulmonary hypertension, obesity, CKD admitted for GI bleeding and AKI.     Severe nonoliguric AKI on CKD: Baseline creatinine around 1.7.  AKI likely secondary to volume depletion and possible urinary retention.  Renal ultrasound unremarkable except for large postvoid residual of 250 cc.  Creatinine continues to improve down to 2.2 today. -Continue with intermittent hydration as needed -Continue holding Entresto, Aldactone, torsemide -Management of anemia as below -Continue monitor bladder scans for signs of retention -Start Flomax for your urinary retention; may be bladder atonia from alcohol so could consider stopping if hypotension continues to be an issue -Would likely recommend starting back Entresto at low-dose and titrating up as needed before starting Aldactone.  Then could use torsemide as needed for volume overload. -Continue to monitor daily Cr, Dose meds for GFR -Monitor Daily I/Os, Daily weight  -Maintain MAP>65 for optimal renal perfusion.  -Avoid nephrotoxic medications including NSAIDs and Vanc/Zosyn combo -Currently no indication for HD  Urinary Retention: noticed on RUS. No obvious hydro. Started flomax.  Anemia: Concern for upper GI bleed.  Hgb stable around 8-9.  Status post EGD.  Iron replete.  Hemoglobin stable today.Marland Kitchen   Hyponatremia: Likely hypovolemic.  Possible reset osmostat given chronic alcohol use disorder.  Now has resolved with a sodium 136  CHF: Decreased EF likely associated with alcohol.  Holding home medications given hypotension.  Continue to monitor and consider restarting these medications very slowly in the outpatient setting  Alcohol use disorder: Counseled on cessation today  Given the patient's  significant improvement in renal function we will sign off at this time.  Please do not hesitate to contact nephrology if any further questions arise.  Recommendations conveyed to primary service.    Darnell Level [facility] 08/31/2020 1:59 PM  ___________________________________________________________  CC: AKI  Interval History/Subjective: Patient feels well today without any complaints.  States he overall feels better.  Denies shortness of breath or edema.  No nausea or vomiting.  Discussed the importance of him stopping alcohol.   Medications:  Current Facility-Administered Medications  Medication Dose Route Frequency Provider Last Rate Last Admin  . acetaminophen (TYLENOL) tablet 650 mg  650 mg Oral Q6H PRN Jeani Hawking, MD       Or  . acetaminophen (TYLENOL) suppository 650 mg  650 mg Rectal Q6H PRN Jeani Hawking, MD      . folic acid (FOLVITE) tablet 1 mg  1 mg Oral Daily Jeani Hawking, MD   1 mg at 08/31/20 0926  . LORazepam (ATIVAN) tablet 1-4 mg  1-4 mg Oral Q1H PRN Jeani Hawking, MD       Or  . LORazepam (ATIVAN) injection 1-4 mg  1-4 mg Intravenous Q1H PRN Jeani Hawking, MD      . multivitamin with minerals tablet 1 tablet  1 tablet Oral Daily Jeani Hawking, MD   1 tablet at 08/31/20 0926  . ondansetron (ZOFRAN) tablet 4 mg  4 mg Oral Q6H PRN Jeani Hawking, MD       Or  . ondansetron [facility]) injection 4 mg  4 mg Intravenous Q6H PRN Jeani Hawking, MD      . pantoprazole (PROTONIX) injection 40 mg  40 mg Intravenous Q12H Jeani Hawking, MD   40 mg at 08/31/20 0926  . polyethylene  glycol (MIRALAX / GLYCOLAX) packet 17 g  17 g Oral Daily PRN Jeani Hawking, MD      . tamsulosin [facility]) capsule 0.4 mg  0.4 mg Oral Daily Jeani Hawking, MD   0.4 mg at 08/31/20 0926  . thiamine tablet 100 mg  100 mg Oral Daily Jeani Hawking, MD   100 mg at 08/31/20 5427      Review of Systems: 10 systems reviewed and negative except per interval  history/subjective  Physical Exam: Vitals:   08/31/20 0929 08/31/20 1304  BP: (!) 93/55 103/73  Pulse: 85 (!) 107  Resp: 18 (!) 21  Temp: 97.9 F (36.6 C) 97.9 F (36.6 C)  SpO2: 100% 99%   Total I/O In: 480 [P.O.:480] Out: -   Intake/Output Summary (Last 24 hours) at 08/31/2020 1359 Last data filed at 08/31/2020 0900 Gross per 24 hour  Intake 1928.19 ml  Output 1085 ml  Net 843.19 ml   Constitutional: well-appearing, no acute distress ENMT: ears and nose without scars or lesions, MMM CV: normal rate, no edema Respiratory: bilateral chest rise, normal work of breathing Gastrointestinal: soft, non-tender, no palpable masses or hernias Skin: no visible lesions or rashes Psych: alert, judgement/insight appropriate, appropriate mood and affect   Test Results I personally reviewed new and old clinical labs and radiology tests Lab Results  Component Value Date   NA 136 08/31/2020   K 4.7 08/31/2020   CL 102 08/31/2020   CO2 22 08/31/2020   BUN 62 (H) 08/31/2020   CREATININE 2.16 (H) 08/31/2020   CALCIUM 9.0 08/31/2020   ALBUMIN 2.8 (L) 08/31/2020   PHOS 3.9 08/29/2020

## 2020-08-31 NOTE — Evaluation (Signed)
Physical Therapy Evaluation Patient Details Name: Christopher [name] MRN: [id number] DOB: 1966-11-03 Today's Date: 08/31/2020   History of Present Illness  54 y.o. male with medical history significant of chronic combined diastolic/systolic congestive heart failure, severe pulmonary hypertension, alcohol related dilated cardiomyopathy, CKD stage IIIa, morbid obesity, gout who presented to [facility] long ED after PCP visit for anemia.  Pt found to have Upper GI bleed  Clinical Impression  Patient evaluated by Physical Therapy with no further acute PT needs identified. All education has been completed and the patient has no further questions.  Pt with bed alarming on arrival and pt reaching around to turn off bed alarm from sitting.  Pt requested using urinal.  Pt agreeable to mobilize and ambulate in hallway.  No unsteadiness or LOB observed.  Pt eager to see MD and hopeful for d/c home soon. See below for any follow-up Physical Therapy or equipment needs. PT is signing off. Thank you for this referral.     Follow Up Recommendations No PT follow up    Equipment Recommendations  None recommended by PT    Recommendations for Other Services       Precautions / Restrictions Precautions Precautions: Fall      Mobility  Bed Mobility Overal bed mobility: Modified Independent                Transfers Overall transfer level: Needs assistance Equipment used: None Transfers: Sit to/from Stand Sit to Stand: Supervision         General transfer comment: supervision for safety  Ambulation/Gait Ambulation/Gait assistance: Supervision Gait Distance (Feet): 250 Feet Assistive device: None Gait Pattern/deviations: WFL(Within Functional Limits)     General Gait Details: supervision for safety, no LOB or unsteadiness observed  Stairs            Wheelchair Mobility    Modified Rankin (Stroke Patients Only)       Balance Overall balance assessment: No apparent balance  deficits (not formally assessed) (denies any falls)                                           Pertinent Vitals/Pain Pain Assessment: No/denies pain    Home Living Family/patient expects to be discharged to:: Private residence Living Arrangements: Alone Available Help at Discharge: Family;Available PRN/intermittently (son) Type of Home: Apartment Home Access: Stairs to enter   [employer] of Steps: 3 flights Home Layout: One level Home Equipment: None      Prior Function Level of Independence: Independent         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        Extremity/Trunk Assessment        Lower Extremity Assessment Lower Extremity Assessment: Overall WFL for tasks assessed    Cervical / Trunk Assessment Cervical / Trunk Assessment: Normal  Communication      Cognition Arousal/Alertness: Awake/alert Behavior During Therapy: WFL for tasks assessed/performed Overall Cognitive Status: Within Functional Limits for tasks assessed                                        General Comments      Exercises     Assessment/Plan    PT Assessment Patent does not need any further PT services  PT Problem List         PT Treatment Interventions      PT Goals (Current goals can be found in the Care Plan section)  Acute Rehab PT Goals PT Goal Formulation: All assessment and education complete, DC therapy    Frequency     Barriers to discharge        Co-evaluation               AM-PAC PT "6 Clicks" Mobility  Outcome Measure Help needed turning from your back to your side while in a flat bed without using bedrails?: None Help needed moving from lying on your back to sitting on the side of a flat bed without using bedrails?: None Help needed moving to and from a bed to a chair (including a wheelchair)?: None Help needed standing up from a chair using your arms (e.g., wheelchair  or bedside chair)?: None Help needed to walk in hospital room?: None Help needed climbing 3-5 steps with a railing? : None 6 Click Score: 24    End of Session Equipment Utilized During Treatment: Gait belt Activity Tolerance: Patient tolerated treatment well Patient left: in bed;with call bell/phone within reach;with bed alarm set   PT Visit Diagnosis: Difficulty in walking, not elsewhere classified (R26.2)    Time: 1020-1035 PT Time Calculation (min) (ACUTE ONLY): 15 min   Charges:   PT Evaluation $PT Eval Low Complexity: 1 Low        Kati PT, DPT Acute Rehabilitation Services Pager: [phone] Office: [phone]  Maida Sale E 08/31/2020, 11:44 AM

## 2020-08-31 NOTE — Progress Notes (Signed)
Nutrition Brief Note  Patient identified on the Malnutrition Screening Tool (MST) Report  Wt Readings from Last 15 Encounters:  08/31/20 119.5 kg  06/18/19 127 kg  06/04/19 (!) 140.6 kg  05/20/19 135.4 kg  05/06/19 (!) 145.9 kg    Body mass index is 37.8 kg/m. Patient meets criteria for obesity based on current BMI. Weight today is 263 lb and weight on 06/18/19 was 279 lb. This indicates 16 lb weight loss (9% body weight) in the past 14 months; unsure if weight loss was more acute.   MST screening note outlines that patient reported losing 28 lb in the past few months.   Current diet order is Heart Healthy and patient ate 100% of dinner last night (678 kcal, 31 grams protein) and 100% of breakfast this AM (852 kcal, 23 grams protein).   Labs and medications reviewed. Discharge order to discharge home entered ~1 hour ago; no discharge summary entered yet.    No nutrition interventions warranted at this time. If nutrition issues arise, please consult RD.       Trenton Gammon, MS, RD, LDN, CNSC Inpatient Clinical Dietitian RD pager # available in [facility]  After hours/weekend pager # available in [facility]

## 2020-09-02 ENCOUNTER — Other Ambulatory Visit: Payer: Self-pay | Admitting: *Deleted

## 2020-09-02 ENCOUNTER — Encounter: Payer: Self-pay | Admitting: *Deleted

## 2020-09-02 NOTE — Patient Outreach (Signed)
[facility]) Care Management  09/02/2020  Christopher [name] May 23, 1966 [id number]   Transition of care call/case closure   Referral received:9/13 Initial outreach:09/02/20 Insurance: [place] [employer]    Subjective: Initial successful telephone call to patient's preferred number in order to complete transition of care assessment; 2 HIPAA identifiers verified. Explained purpose of call and completed transition of care assessment.  Patient states that he is feeling pretty good. He denies dizziness, no signs of bleeding or dark stools chest pain or abdominal pain. He reports tolerating diet without nausea, denies pain. He reports that he has give up on drinking . He  denies bowel or bladder problems reports urinating well he recalled being on a new medication to help empty bladder. He states that his son is available to assist  with his recovery.  He discussed ongoing health issue of heart failure buts states his doctor says it has improved. He reports weighing self but not daily, he states recent weight is 268 and he has lost weight over the last few months. He denies shortness or swelling. He discussed that his blood pressure was lower during hospital stay and acknowledging heart failure and blood pressure medications on hold until seen by PCP or Dr. Jacinto Halim. He reports occasionally going to pharmacy to get blood pressure checked  He is agreeable to a  a referral to one of the [place] chronic disease management programs. Discussed with patient blood pressure monitor available to [facility] health pharmacy when in stock for cost saving of $15, he states he will check on getting on, encouraged notifying MD  sooner for concerns of low blood or other concerns.  States that he does  not have the hospital indemnity He  uses a [employer] outpatient pharmacy at [facility] outpatient pharmacy.  Discussed with patient [place] benefit of Employee assistance counseling as a resource for alcohol use,  provided contact number.  He denies educational needs related to staying safe during the COVID 19 pandemic.    Objective:  Christopher [name]  hospitalized at [facility]  From 9/13-9/15/21 for Upper GI bleed, Acute on chronic CKD, hyponatermia  Comorbidities include: Chronic combined systolic/diastolic congestive heart failure EF 20 to 25 %  ETOH abuse, Morbid obesity, severe pulmonary hypertension.  He was discharged to home on 08/31/20 without the need for home health services or DME.   Assessment:  Patient voices good understanding of all discharge instructions.  See transition of care flowsheet for assessment details.   Plan:  Reviewed hospital discharge diagnosis of  Upper GI bleed   and discharge treatment plan using hospital discharge instructions, assessing medication adherence, reviewing problems requiring provider notification, and discussing the importance of follow up with surgeon, primary care provider and/or specialists as directed. Stressed importance of keeping all medical appointment with PCP for direction on medication management , reinforced scheduling office visit with Dr. Jacinto Halim Cardiology.   Reviewed [place] healthy lifestyle program information to receive discounted premium for  2022   Step 1: Get  your annual physical  Step 2: Complete your health assessment  Step 3:Identify your current health status and complete the corresponding action step between January 1, and August 17, 2020.  Provided patient with contact number, declined assistance with call today.  Using Active Health Management ActiveAdvice View website, with patient agreement enrolled to  participate in [place]'s Active Health Management chronic disease management program.     No ongoing care management needs identified so will close  case to [facility] [employer] Care Management services, patient declined additional outreach calls, Will  route successful outreach letter with  Brewing technologist and 24 Hour Nurse Line Magnet to [employer] Care Management clinical pool to be mailed to patient's home address.  Thanked patient for their services to [facility].   Egbert Garibaldi, RN, BSN  [facility] Care Management,Care Management Coordinator  [phone]- Mobile [phone]- Toll Free Main Office

## 2020-09-02 NOTE — Discharge Summary (Signed)
Physician Discharge Summary  Christopher [name] DQQ:[id number] DOB: [birth year] DOA: 08/29/2020  PCP: Kirby Funk, MD  Admit date: 08/29/2020 Discharge date: 08/31/2020  Admitted From: Home Disposition: Home   Recommendations for Outpatient Follow-up:  1. Follow up with PCP in 1-2 weeks 2. Follow up with cardiology, Dr. Jacinto Halim, in the next week. Holding cardiac medications with significant AKI and hypovolemia.  3. Please obtain BMP/CBC in one week 4. Follow up pathology from biopsy at EGD 9/14. Follow up with GI, Dr. Elnoria Howard in the next month.  5. Follow up sublingual lymph nodes after discharge and pursue further work up if continues.  Home Health: None Equipment/Devices: None Discharge Condition: Stable CODE STATUS: Full Diet recommendation: Heart healthy  Brief/Interim Summary: Christopher [name] a 54 y.o.malewith medical history significant ofchronic combined diastolic/systolic congestive heart failure, severe pulmonary hypertension, alcohol related dilated cardiomyopathy, CKD stage IIIa,morbid obesity,gout who presented to [facility] long ED 9/13 after PCP visit for anemia, melena. Hgb was down to 8 from baseline of 15. FOBT was positive and EGD was performed 9/14 revealing nonbleeding ulcers in the esophagus and duodenum as well as gastritis. PPI was started and continued per GI recommendation, BID for 1 month, then once daily. Nephrology had been consulted for AKI, though this improved with volume resuscitation and holding diuretics/entresto. Incomplete bladder emptying was noted with no hydronephrosis on U/S. Flomax was started prior to discharge.    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 Patient reports dark stools  over the past week. +FOBT. Elevated BUN of 107, but may be related to renal failure.  - Alcohol cessation recommended, continue avoiding NSAIDs.  - Continue PPI BID x1 month, then daily thereafter.  - Follow up biopsy result from EGD by Dr. Elnoria Howard 9/14.  - Follow up with Dr. Elnoria Howard  - Hgb proven to be stable with no ongoing bleeding prior to discharge. No transfusion support required at this time. Repeat CBC at follow up is recommended.   Acute on chronic CKD stage IIIa: Creatinine 5.42 on admission with elevated BUN of 107.  Last creatinine 1.7-1.8 in June 2020.  - Given hypovolemic status, will hold diuretics at discharge pending follow up (which can be expedited per pt). Daily weights, etc. still recommended and plan to restart if weight gain noted.  - Continue nephrology follow up at DC.  - Continue flomax due to incomplete bladder emptying, recommend urology follow up.  Hyponatremia,suspect hypovolemic Sodium 128 on presentation. Etiology likely secondary to thiazide/loop diuretics with renal failure as well as alcohol abuse. Currently sodium improving.  Monitor at follow up.  Chronic combined systolic/diastolic congestive heart failure,compensated Last TTE 623 2020 with LVEF 20-25%, severe LV dilation, diastolic dysfunction, RV severely reduced systolic function.  - Pt to follow up closely with Dr. Jacinto Halim after discharge. Holding nephrotoxic medications at this time.    Continued EtOH abuse: 3-4 beers daily.  - Cessation recommended.  Sublingual nodules Noted sublingual nodules, patient reports no increase in size, nonpainful. Recommend outpatient follow-up with dental versus ENT  Obesity: Estimated body mass index is 37.8 kg/m as calculated from the following:   Height as of this encounter: 5\' 10"  (1.778 m).   Weight as of this encounter: 119.5 kg.  Discharge Instructions Discharge Instructions    Diet - low sodium heart healthy   Complete by: As directed    Discharge  instructions   Complete by: As directed    You were evaluated for GI bleeding and found to have ulcers in the esophagus and small intestine (duodenum) as well as irritation in the stomach (gastritis) which will be treated with protonix twice daily (new prescription sent to your pharmacy).   You need to avoid all NSAIDs (including aleve, advil, ibuprofen, goody's, motrin, and others). You may take tylenol as needed for pain. Avoid alcohol and smoking as well.   To help completely empty the bladder, start taking flomax once daily (sent to your pharmacy).   STOP taking your blood pressure/heart failure medications until you follow up with your doctor (either Dr. Valentina Lucks or Dr. Jacinto Halim) because your blood pressure remains lower than usual. You will need to restart these medications eventually, so don't get rid of them, but wait to restart until you get cleared to do so by your doctor.  If you notice any bleeding or very dark stools, lightheadedness, shortness of breath, chest pain, abdominal pain or other worrisome symptoms, seek medical attention right away.     Allergies as of 08/31/2020   No Known Allergies     Medication List    STOP taking these medications   naproxen sodium 220 MG tablet Commonly known as: ALEVE     TAKE these medications   allopurinol 300 MG tablet Commonly known as: ZYLOPRIM Take 300 mg by mouth every morning.   aspirin EC 81 MG tablet Take 81 mg by mouth every morning.   carvedilol 25 MG tablet Commonly known as: COREG Take 1 tablet (25 mg total) by mouth every morning. do not take until follow up with PCP/cardiology What changed: additional instructions   Entresto 97-103 MG Generic drug: sacubitril-valsartan Take 1 tablet by mouth every morning. do not take until follow up with PCP/cardiology What changed: additional instructions   pantoprazole 40 MG tablet Commonly known as: PROTONIX Take 1 tablet (40 mg total) by mouth 2 (two) times daily.    potassium chloride 10 MEQ tablet Commonly known as: KLOR-CON Take 1 tablet (10 mEq total) by mouth every morning. do not take until follow up with PCP/cardiology What changed: additional instructions   spironolactone 25 MG tablet Commonly known as: ALDACTONE Take 1 tablet (25 mg total) by mouth every morning. do not take until follow up with PCP/cardiology What changed: additional instructions   tamsulosin 0.4 MG Caps capsule Commonly known as: FLOMAX Take 1 capsule (0.4 mg total) by mouth daily.   torsemide 20 MG tablet Commonly known as: Demadex Take 1 tablet (20 mg total) by mouth daily. do not take until follow up with PCP/cardiology What changed:   how much to take  additional instructions       Follow-up Information    Kirby Funk, MD. Schedule an appointment as soon as possible for a visit.   Specialty: Internal Medicine Contact information: [address] [place] [address] [phone]        Yates Decamp, MD Follow up.   Specialty: Cardiology Contact information: [address] [place] [address] [phone]        Yates Decamp, MD .   Specialty: Cardiology Contact information: [address] [place] [address] [phone]              No Known Allergies  Consultations:  GI  Nephrology  Procedures/Studies: US RENAL  Result Date: 08/29/2020 CLINICAL DATA:  54 year old male with renal failure. EXAM: RENAL / URINARY TRACT ULTRASOUND COMPLETE COMPARISON:  Report of CT Abdomen and Pelvis 05/10/2007 (no images available). FINDINGS: Right Kidney: Renal measurements: 13.3 x 5.8 x 5.2 cm = volume: 209 mL. Cortical echogenicity within normal limits. No evidence of right hydronephrosis or renal lesion (image 31). Left Kidney: Renal measurements: 13.8 x 6.6 x 4.8 cm = volume: 230 mL. Cortical echogenicity within normal limits. No evidence of left hydronephrosis or renal lesion (image 81). Bladder:  Appears normal for degree of bladder distention. Both ureteral jets detected with Doppler. Prevoid bladder volume estimated at 586 mL, with a large postvoid residual of 250 mL. Other: Evidence of extensive shadowing gallstones in the region of the gallbladder (image 58). IMPRESSION: 1. Bladder remarkable for large postvoid residual, query urinary retention. 2. However, normal ultrasound appearance of both kidneys. 3. Evidence of cholelithiasis. Electronically Signed   By: Odessa FlemingH  Hall M.D.   On: 08/29/2020 20:04    EGD 08/30/2020: Impression:               - Esophageal ulcer with no stigmata of recent                            bleeding. Biopsied.                           - Gastritis. Biopsied.                           - Non-bleeding duodenal ulcer with no stigmata of                            bleeding.  Recommendation:           - Return patient to hospital ward for ongoing care.                           - Cardiac diet.                           - Continue present medications.                           - Await pathology results.                           - PPI BID x 1 month and then QD.                           - Follow HGB and transfuse if necessary.  Subjective: Feels well, no bleeding noted, wants to go home. Normal urine output. No abd pain, tolerating diet.   Discharge Exam: Vitals:   08/31/20 1304 08/31/20 1417  BP: 103/73   Pulse: (!) 107 (!) 102  Resp: (!) 21 16  Temp: 97.9 F (36.6 C)   SpO2: 99%    General: Pt is alert, awake, not in acute distress Cardiovascular: RRR, S1/S2 +, no rubs, no gallops Respiratory: CTA bilaterally, no wheezing, no rhonchi Abdominal: Soft, NT, ND, bowel sounds + Extremities: No edema, no cyanosis  Labs:  BNP (last 3 results) No results for input(s): BNP in the last 8760 hours. Basic Metabolic Panel: Recent Labs  Lab 08/29/20 1417 08/29/20 2140 08/30/20 0512 08/31/20 0536  NA 128* 134* 132* 136  K 5.0  --  3.6 4.7  CL 90*  --  97*  102  CO2 24  --  23 22  GLUCOSE 119*  --  77 95  BUN 107*  --  86* 62*  CREATININE 5.42*  --  3.12* 2.16*  CALCIUM 8.8*  --  8.3* 9.0  MG  --  1.7  --  1.9  PHOS  --  3.9  --   --    Liver Function Tests: Recent Labs  Lab 08/29/20 1417 08/30/20 0512 08/31/20 0536  AST 53* 43* 50*  ALT 32 26 30  ALKPHOS 151* 116 131*  BILITOT 0.9 1.3* 1.0  PROT 6.9 5.9* 6.0*  ALBUMIN 3.3* 2.8* 2.8*   No results for input(s): LIPASE, AMYLASE in the last 168 hours. No results for input(s): AMMONIA in the last 168 hours. CBC: Recent Labs  Lab 08/29/20 1417 08/29/20 2140 08/30/20 0512 08/31/20 0536 08/31/20 1320  WBC 9.9  --  8.7 7.3  --   HGB 8.8* 9.4* 8.6* 8.0* 8.7*  HCT 25.1* 25.7* 24.5* 23.5* 25.2*  MCV 108.2*  --  107.5* 112.4*  --   PLT 177  --  173 152  --    Cardiac Enzymes: No results for input(s): CKTOTAL, CKMB, CKMBINDEX, TROPONINI in the last 168 hours. BNP: Invalid input(s): POCBNP CBG: No results for input(s): GLUCAP in the last 168 hours. D-Dimer No results for input(s): DDIMER in the last 72 hours. Hgb A1c No results for input(s): HGBA1C in the last 72 hours. Lipid Profile No results for input(s): CHOL, HDL, LDLCALC, TRIG, CHOLHDL, LDLDIRECT in the last 72 hours. Thyroid function studies No results for input(s): TSH, T4TOTAL, T3FREE, THYROIDAB in the last 72 hours.  Invalid input(s): FREET3 Anemia work up No results for input(s): VITAMINB12, FOLATE, FERRITIN, TIBC, IRON, RETICCTPCT in the last 72 hours. Urinalysis    Component Value Date/Time   COLORURINE YELLOW 08/29/2020 1912   APPEARANCEUR CLEAR 08/29/2020 1912   LABSPEC 1.010 08/29/2020 1912   PHURINE 5.0 08/29/2020 1912   GLUCOSEU NEGATIVE 08/29/2020 1912   HGBUR NEGATIVE 08/29/2020 1912   BILIRUBINUR NEGATIVE 08/29/2020 1912   KETONESUR NEGATIVE 08/29/2020 1912   PROTEINUR NEGATIVE 08/29/2020 1912   NITRITE NEGATIVE 08/29/2020 1912   LEUKOCYTESUR NEGATIVE 08/29/2020 1912    Microbiology Recent  Results (from the past 240 hour(s))  SARS Coronavirus 2 by RT PCR (hospital order, performed in [facility] hospital lab) Nasopharyngeal Nasopharyngeal Swab     Status: None   Collection Time: 08/29/20  2:17 PM   Specimen: Nasopharyngeal Swab  Result Value Ref Range Status   SARS Coronavirus 2 NEGATIVE NEGATIVE Final    Comment: (NOTE) SARS-CoV-2 target nucleic acids are NOT DETECTED.  The SARS-CoV-2 RNA is generally detectable in upper and lower respiratory specimens during the acute phase of infection. The lowest concentration of SARS-CoV-2 viral copies this assay can detect is 250 copies / mL. A negative result does not preclude SARS-CoV-2 infection and should not be used as the sole basis for treatment or other patient management decisions.  A negative result may occur with improper specimen collection / handling, submission of specimen other than nasopharyngeal swab, presence of viral mutation(s) within the areas targeted by this assay, and inadequate number of viral copies (<250 copies /  mL). A negative result must be combined with clinical observations, patient history, and epidemiological information.  Fact Sheet for Patients:   BoilerBrush.com.cy  Fact Sheet for Healthcare Providers: https://pope.com/  This test is not yet approved or  cleared by the [place] FDA and has been authorized for detection and/or diagnosis of SARS-CoV-2 by FDA under an Emergency Use Authorization (EUA).  This EUA will remain in effect (meaning this test can be used) for the duration of the COVID-19 declaration under Section 564(b)(1) of the Act, 21 U.S.C. section 360bbb-3(b)(1), unless the authorization is terminated or revoked sooner.  Performed at [facility], [address] [place], [place] [address]     Time coordinating discharge: Approximately 40 minutes  Tyrone Nine, MD  [facility] 09/02/2020, 5:37  PM

## 2020-09-13 DIAGNOSIS — K922 Gastrointestinal hemorrhage, unspecified: Secondary | ICD-10-CM | POA: Diagnosis not present

## 2020-09-13 DIAGNOSIS — E538 Deficiency of other specified B group vitamins: Secondary | ICD-10-CM | POA: Diagnosis not present

## 2020-09-13 DIAGNOSIS — I5040 Unspecified combined systolic (congestive) and diastolic (congestive) heart failure: Secondary | ICD-10-CM | POA: Diagnosis not present

## 2020-09-13 DIAGNOSIS — D539 Nutritional anemia, unspecified: Secondary | ICD-10-CM | POA: Diagnosis not present

## 2020-09-13 DIAGNOSIS — N19 Unspecified kidney failure: Secondary | ICD-10-CM | POA: Diagnosis not present

## 2020-09-19 ENCOUNTER — Other Ambulatory Visit (HOSPITAL_COMMUNITY): Payer: Self-pay | Admitting: Internal Medicine

## 2020-09-19 MED FILL — FOLIC ACID 1 MG TABS: 1 | 30 days supply | Qty: 30 | Fill #0

## 2020-09-27 DIAGNOSIS — Z8719 Personal history of other diseases of the digestive system: Secondary | ICD-10-CM | POA: Diagnosis not present

## 2020-09-27 DIAGNOSIS — I5040 Unspecified combined systolic (congestive) and diastolic (congestive) heart failure: Secondary | ICD-10-CM | POA: Diagnosis not present

## 2020-09-29 MED FILL — ALLOPURINOL 300 MG TABS: 300 | 90 days supply | Qty: 90 | Fill #1

## 2020-10-13 ENCOUNTER — Other Ambulatory Visit (HOSPITAL_COMMUNITY): Payer: Self-pay | Admitting: Internal Medicine

## 2020-10-13 MED FILL — FOLIC ACID 1 MG TABS: 1 | 30 days supply | Qty: 30 | Fill #0

## 2020-10-27 ENCOUNTER — Other Ambulatory Visit (HOSPITAL_COMMUNITY): Payer: Self-pay | Admitting: Internal Medicine

## 2020-10-27 MED FILL — SPIRONOLACTONE 25 MG TABS: 25 | 90 days supply | Qty: 90 | Fill #0

## 2020-10-27 MED FILL — CARVEDILOL 25 MG TABLET: 25 | 90 days supply | Qty: 180 | Fill #1

## 2020-11-08 MED FILL — FOLIC ACID 1 MG TABS: 1 | 30 days supply | Qty: 30 | Fill #1

## 2020-11-22 ENCOUNTER — Other Ambulatory Visit (HOSPITAL_COMMUNITY): Payer: Self-pay | Admitting: Internal Medicine

## 2020-11-22 MED FILL — TORSEMIDE 20 MG TABLET: 20 | 90 days supply | Qty: 90 | Fill #0

## 2020-12-06 MED FILL — FOLIC ACID 1 MG TABS: 1 | 30 days supply | Qty: 30 | Fill #2

## 2020-12-29 ENCOUNTER — Other Ambulatory Visit (HOSPITAL_COMMUNITY): Payer: Self-pay | Admitting: Internal Medicine

## 2020-12-29 MED FILL — ALLOPURINOL 300 MG TABS: 300 | 90 days supply | Qty: 90 | Fill #2

## 2020-12-29 MED FILL — ENTRESTO 97 MG-103 MG TAB: 97-103 | 90 days supply | Qty: 180 | Fill #0

## 2021-01-09 MED FILL — FOLIC ACID 1 MG TABS: 1 | 30 days supply | Qty: 30 | Fill #3

## 2021-02-07 ENCOUNTER — Other Ambulatory Visit (HOSPITAL_COMMUNITY): Payer: Self-pay | Admitting: Internal Medicine

## 2021-02-07 MED FILL — FOLIC ACID 1 MG TABS: 1 | 30 days supply | Qty: 30 | Fill #4

## 2021-02-07 MED FILL — POTASSIUM CHLORIDE CRYS ER: 10 | 90 days supply | Qty: 180 | Fill #0

## 2021-02-07 MED FILL — SPIRONOLACTONE 25 MG TABS: 25 | 90 days supply | Qty: 90 | Fill #1

## 2021-03-06 ENCOUNTER — Other Ambulatory Visit (HOSPITAL_COMMUNITY): Payer: Self-pay | Admitting: Internal Medicine

## 2021-03-06 MED FILL — CARVEDILOL 25 MG TABLET: 25 | 90 days supply | Qty: 180 | Fill #0

## 2021-03-09 MED FILL — TORSEMIDE 20 MG TABLET: 20 | 90 days supply | Qty: 90 | Fill #1

## 2021-03-14 MED FILL — FOLIC ACID 1 MG TABS: 1 | 30 days supply | Qty: 30 | Fill #5

## 2021-03-22 ENCOUNTER — Other Ambulatory Visit (HOSPITAL_COMMUNITY): Payer: Self-pay

## 2021-03-22 MED ORDER — ALLOPURINOL 300 MG PO TABS
300.0000 mg | ORAL_TABLET | Freq: Every day | ORAL | 3 refills | Status: DC
  Filled 2021-03-22: qty 90, 90d supply, fill #0
  Filled 2021-06-11: qty 90, 90d supply, fill #1
  Filled 2021-10-05: qty 90, 90d supply, fill #2
  Filled 2022-01-10: qty 90, 90d supply, fill #3

## 2021-04-06 ENCOUNTER — Other Ambulatory Visit (HOSPITAL_COMMUNITY): Payer: Self-pay

## 2021-04-06 MED FILL — Folic Acid Tab 1 MG: ORAL | 30 days supply | Qty: 30 | Fill #0 | Status: AC

## 2021-04-07 ENCOUNTER — Other Ambulatory Visit (HOSPITAL_COMMUNITY): Payer: Self-pay

## 2021-05-09 ENCOUNTER — Other Ambulatory Visit (HOSPITAL_COMMUNITY): Payer: Self-pay

## 2021-05-09 MED FILL — Folic Acid Tab 1 MG: ORAL | 30 days supply | Qty: 30 | Fill #1 | Status: AC

## 2021-05-09 MED FILL — Spironolactone Tab 25 MG: ORAL | 90 days supply | Qty: 90 | Fill #0 | Status: AC

## 2021-06-05 ENCOUNTER — Other Ambulatory Visit (HOSPITAL_COMMUNITY): Payer: Self-pay

## 2021-06-05 MED FILL — Torsemide Tab 20 MG: ORAL | 90 days supply | Qty: 90 | Fill #0 | Status: AC

## 2021-06-05 MED FILL — Folic Acid Tab 1 MG: ORAL | 30 days supply | Qty: 30 | Fill #2 | Status: AC

## 2021-06-12 ENCOUNTER — Other Ambulatory Visit (HOSPITAL_COMMUNITY): Payer: Self-pay

## 2021-07-03 DIAGNOSIS — R5383 Other fatigue: Secondary | ICD-10-CM | POA: Diagnosis not present

## 2021-07-03 DIAGNOSIS — R0609 Other forms of dyspnea: Secondary | ICD-10-CM | POA: Diagnosis not present

## 2021-07-10 ENCOUNTER — Other Ambulatory Visit (HOSPITAL_COMMUNITY): Payer: Self-pay

## 2021-07-10 MED FILL — Carvedilol Tab 25 MG: ORAL | 5 days supply | Qty: 9 | Fill #0 | Status: AC

## 2021-07-10 MED FILL — Carvedilol Tab 25 MG: ORAL | 85 days supply | Qty: 171 | Fill #0 | Status: AC

## 2021-07-16 MED FILL — Folic Acid Tab 1 MG: ORAL | 30 days supply | Qty: 30 | Fill #3 | Status: AC

## 2021-07-16 MED FILL — Potassium Chloride Microencapsulated Crys ER Tab 10 mEq: ORAL | 90 days supply | Qty: 180 | Fill #0 | Status: AC

## 2021-07-17 ENCOUNTER — Other Ambulatory Visit (HOSPITAL_COMMUNITY): Payer: Self-pay

## 2021-08-07 ENCOUNTER — Other Ambulatory Visit (HOSPITAL_COMMUNITY): Payer: Self-pay

## 2021-08-07 MED FILL — Spironolactone Tab 25 MG: ORAL | 90 days supply | Qty: 90 | Fill #1 | Status: AC

## 2021-08-07 MED FILL — Sacubitril-Valsartan Tab 97-103 MG: ORAL | 90 days supply | Qty: 180 | Fill #0 | Status: AC

## 2021-08-07 MED FILL — Torsemide Tab 20 MG: ORAL | 90 days supply | Qty: 90 | Fill #1 | Status: CN

## 2021-08-16 ENCOUNTER — Other Ambulatory Visit (HOSPITAL_COMMUNITY): Payer: Self-pay

## 2021-08-16 MED FILL — Folic Acid Tab 1 MG: ORAL | 30 days supply | Qty: 30 | Fill #4 | Status: AC

## 2021-08-16 MED FILL — Torsemide Tab 20 MG: ORAL | 90 days supply | Qty: 90 | Fill #1 | Status: AC

## 2021-09-14 ENCOUNTER — Other Ambulatory Visit (HOSPITAL_COMMUNITY): Payer: Self-pay

## 2021-09-14 MED FILL — Folic Acid Tab 1 MG: ORAL | 30 days supply | Qty: 30 | Fill #5 | Status: AC

## 2021-10-05 ENCOUNTER — Other Ambulatory Visit (HOSPITAL_COMMUNITY): Payer: Self-pay

## 2021-10-06 ENCOUNTER — Other Ambulatory Visit (HOSPITAL_COMMUNITY): Payer: Self-pay

## 2021-10-06 MED ORDER — FOLIC ACID 1 MG PO TABS
1.0000 mg | ORAL_TABLET | Freq: Every day | ORAL | 0 refills | Status: DC
Start: 2021-10-06 — End: 2023-10-12
  Filled 2021-10-06: qty 90, 90d supply, fill #0

## 2021-10-09 ENCOUNTER — Other Ambulatory Visit (HOSPITAL_COMMUNITY): Payer: Self-pay

## 2021-10-23 ENCOUNTER — Other Ambulatory Visit (HOSPITAL_COMMUNITY): Payer: Self-pay

## 2021-10-23 MED FILL — Carvedilol Tab 25 MG: ORAL | 90 days supply | Qty: 180 | Fill #1 | Status: AC

## 2021-10-24 ENCOUNTER — Other Ambulatory Visit (HOSPITAL_COMMUNITY): Payer: Self-pay

## 2021-11-02 ENCOUNTER — Other Ambulatory Visit (HOSPITAL_COMMUNITY): Payer: Self-pay

## 2021-11-02 MED ORDER — TORSEMIDE 20 MG PO TABS
20.0000 mg | ORAL_TABLET | Freq: Every day | ORAL | 3 refills | Status: DC
  Filled 2021-11-02: qty 90, 90d supply, fill #0
  Filled 2022-02-11: qty 90, 90d supply, fill #1
  Filled 2022-06-24: qty 90, 90d supply, fill #2
  Filled 2022-10-15: qty 90, 90d supply, fill #3

## 2021-11-02 MED FILL — Sacubitril-Valsartan Tab 97-103 MG: ORAL | 90 days supply | Qty: 180 | Fill #1 | Status: AC

## 2021-11-03 ENCOUNTER — Other Ambulatory Visit (HOSPITAL_COMMUNITY): Payer: Self-pay

## 2021-11-03 MED ORDER — SPIRONOLACTONE 25 MG PO TABS
25.0000 mg | ORAL_TABLET | Freq: Every day | ORAL | 0 refills | Status: DC
  Filled 2021-11-03: qty 90, 90d supply, fill #0

## 2021-12-04 ENCOUNTER — Other Ambulatory Visit (HOSPITAL_COMMUNITY): Payer: Self-pay

## 2021-12-04 MED FILL — Potassium Chloride Microencapsulated Crys ER Tab 10 mEq: ORAL | 90 days supply | Qty: 180 | Fill #1 | Status: AC

## 2022-01-10 ENCOUNTER — Other Ambulatory Visit (HOSPITAL_COMMUNITY): Payer: Self-pay

## 2022-01-11 ENCOUNTER — Other Ambulatory Visit (HOSPITAL_COMMUNITY): Payer: Self-pay

## 2022-01-11 MED ORDER — ENTRESTO 97-103 MG PO TABS
1.0000 | ORAL_TABLET | Freq: Two times a day (BID) | ORAL | 3 refills | Status: DC
  Filled 2022-01-11: qty 180, 90d supply, fill #0
  Filled 2022-06-28: qty 180, 90d supply, fill #1
  Filled 2022-10-15: qty 180, 90d supply, fill #2

## 2022-01-12 ENCOUNTER — Other Ambulatory Visit (HOSPITAL_COMMUNITY): Payer: Self-pay

## 2022-01-12 MED ORDER — ENTRESTO 97-103 MG PO TABS
1.0000 | ORAL_TABLET | Freq: Two times a day (BID) | ORAL | 1 refills | Status: DC
  Filled 2022-01-12: qty 180, 90d supply, fill #0

## 2022-01-14 ENCOUNTER — Other Ambulatory Visit (HOSPITAL_COMMUNITY): Payer: Self-pay

## 2022-01-15 ENCOUNTER — Other Ambulatory Visit (HOSPITAL_COMMUNITY): Payer: Self-pay

## 2022-01-16 ENCOUNTER — Other Ambulatory Visit (HOSPITAL_COMMUNITY): Payer: Self-pay

## 2022-01-16 MED ORDER — FOLIC ACID 1 MG PO TABS
1.0000 mg | ORAL_TABLET | Freq: Every day | ORAL | 0 refills | Status: DC
  Filled 2022-01-16: qty 90, 90d supply, fill #0

## 2022-02-11 MED FILL — Carvedilol Tab 25 MG: ORAL | 90 days supply | Qty: 180 | Fill #2 | Status: AC

## 2022-02-12 ENCOUNTER — Other Ambulatory Visit (HOSPITAL_COMMUNITY): Payer: Self-pay

## 2022-03-06 ENCOUNTER — Other Ambulatory Visit (HOSPITAL_COMMUNITY): Payer: Self-pay

## 2022-03-07 ENCOUNTER — Other Ambulatory Visit (HOSPITAL_COMMUNITY): Payer: Self-pay

## 2022-03-07 MED ORDER — SPIRONOLACTONE 25 MG PO TABS
25.0000 mg | ORAL_TABLET | Freq: Every day | ORAL | 1 refills | Status: DC
  Filled 2022-03-07 (×2): qty 90, 90d supply, fill #0
  Filled 2022-06-24: qty 90, 90d supply, fill #1

## 2022-03-26 ENCOUNTER — Other Ambulatory Visit (HOSPITAL_COMMUNITY): Payer: Self-pay

## 2022-03-27 ENCOUNTER — Other Ambulatory Visit (HOSPITAL_COMMUNITY): Payer: Self-pay

## 2022-03-28 ENCOUNTER — Other Ambulatory Visit (HOSPITAL_COMMUNITY): Payer: Self-pay

## 2022-03-29 ENCOUNTER — Other Ambulatory Visit (HOSPITAL_COMMUNITY): Payer: Self-pay

## 2022-03-30 ENCOUNTER — Other Ambulatory Visit (HOSPITAL_COMMUNITY): Payer: Self-pay

## 2022-04-02 ENCOUNTER — Other Ambulatory Visit (HOSPITAL_COMMUNITY): Payer: Self-pay

## 2022-04-03 ENCOUNTER — Other Ambulatory Visit (HOSPITAL_COMMUNITY): Payer: Self-pay

## 2022-04-03 MED ORDER — POTASSIUM CHLORIDE CRYS ER 10 MEQ PO TBCR
10.0000 meq | EXTENDED_RELEASE_TABLET | Freq: Two times a day (BID) | ORAL | 3 refills | Status: DC
  Filled 2022-04-03: qty 180, 90d supply, fill #0
  Filled 2022-08-03: qty 180, 90d supply, fill #1
  Filled 2022-11-20: qty 180, 90d supply, fill #2
  Filled 2023-03-05: qty 180, 90d supply, fill #3

## 2022-04-04 ENCOUNTER — Other Ambulatory Visit (HOSPITAL_COMMUNITY): Payer: Self-pay

## 2022-04-04 MED ORDER — ALLOPURINOL 300 MG PO TABS
300.0000 mg | ORAL_TABLET | Freq: Every day | ORAL | 3 refills | Status: DC
  Filled 2022-04-04: qty 90, 90d supply, fill #0
  Filled 2022-06-28: qty 90, 90d supply, fill #1
  Filled 2022-09-12: qty 90, 90d supply, fill #2
  Filled 2022-11-21: qty 90, 90d supply, fill #3

## 2022-04-04 MED ORDER — ALLOPURINOL 300 MG PO TABS
300.0000 mg | ORAL_TABLET | Freq: Every day | ORAL | 4 refills | Status: DC
  Filled 2022-04-04 – 2022-12-11 (×3): qty 90, 90d supply, fill #0
  Filled 2023-03-05: qty 90, 90d supply, fill #1

## 2022-05-15 ENCOUNTER — Other Ambulatory Visit (HOSPITAL_COMMUNITY): Payer: Self-pay

## 2022-05-15 MED ORDER — FOLIC ACID 1 MG PO TABS
1.0000 mg | ORAL_TABLET | Freq: Every day | ORAL | 1 refills | Status: DC
  Filled 2022-05-15: qty 90, 90d supply, fill #0
  Filled 2022-08-23: qty 90, 90d supply, fill #1

## 2022-06-24 ENCOUNTER — Other Ambulatory Visit (HOSPITAL_COMMUNITY): Payer: Self-pay

## 2022-06-25 ENCOUNTER — Other Ambulatory Visit (HOSPITAL_COMMUNITY): Payer: Self-pay

## 2022-06-26 ENCOUNTER — Other Ambulatory Visit (HOSPITAL_COMMUNITY): Payer: Self-pay

## 2022-06-27 ENCOUNTER — Other Ambulatory Visit (HOSPITAL_COMMUNITY): Payer: Self-pay

## 2022-06-28 ENCOUNTER — Other Ambulatory Visit (HOSPITAL_COMMUNITY): Payer: Self-pay

## 2022-06-28 MED ORDER — CARVEDILOL 25 MG PO TABS
25.0000 mg | ORAL_TABLET | Freq: Two times a day (BID) | ORAL | 3 refills | Status: DC
  Filled 2022-06-28: qty 180, 90d supply, fill #0
  Filled 2022-10-15: qty 180, 90d supply, fill #1
  Filled 2023-03-05: qty 180, 90d supply, fill #2

## 2022-06-29 ENCOUNTER — Other Ambulatory Visit (HOSPITAL_COMMUNITY): Payer: Self-pay

## 2022-07-04 ENCOUNTER — Other Ambulatory Visit (HOSPITAL_COMMUNITY): Payer: Self-pay

## 2022-08-03 ENCOUNTER — Other Ambulatory Visit (HOSPITAL_COMMUNITY): Payer: Self-pay

## 2022-08-23 ENCOUNTER — Other Ambulatory Visit (HOSPITAL_COMMUNITY): Payer: Self-pay

## 2022-08-27 ENCOUNTER — Other Ambulatory Visit (HOSPITAL_COMMUNITY): Payer: Self-pay

## 2022-09-12 ENCOUNTER — Other Ambulatory Visit (HOSPITAL_COMMUNITY): Payer: Self-pay

## 2022-10-15 ENCOUNTER — Other Ambulatory Visit (HOSPITAL_COMMUNITY): Payer: Self-pay

## 2022-10-16 ENCOUNTER — Other Ambulatory Visit (HOSPITAL_COMMUNITY): Payer: Self-pay

## 2022-11-20 ENCOUNTER — Other Ambulatory Visit (HOSPITAL_COMMUNITY): Payer: Self-pay

## 2022-11-21 ENCOUNTER — Other Ambulatory Visit (HOSPITAL_COMMUNITY): Payer: Self-pay

## 2022-11-21 MED ORDER — FOLIC ACID 1 MG PO TABS
1.0000 mg | ORAL_TABLET | Freq: Every day | ORAL | 1 refills | Status: DC
  Filled 2022-11-21: qty 90, 90d supply, fill #0
  Filled 2023-03-05: qty 90, 90d supply, fill #1

## 2022-12-11 ENCOUNTER — Other Ambulatory Visit (HOSPITAL_COMMUNITY): Payer: Self-pay

## 2023-03-05 ENCOUNTER — Other Ambulatory Visit (HOSPITAL_COMMUNITY): Payer: Self-pay

## 2023-03-05 ENCOUNTER — Other Ambulatory Visit: Payer: Self-pay

## 2023-03-06 ENCOUNTER — Other Ambulatory Visit (HOSPITAL_COMMUNITY): Payer: Self-pay

## 2023-03-06 MED ORDER — SPIRONOLACTONE 25 MG PO TABS
25.0000 mg | ORAL_TABLET | Freq: Every day | ORAL | 0 refills | Status: DC
  Filled 2023-03-06: qty 30, 30d supply, fill #0

## 2023-03-06 MED ORDER — TORSEMIDE 20 MG PO TABS
20.0000 mg | ORAL_TABLET | Freq: Every day | ORAL | 0 refills | Status: DC
  Filled 2023-03-06 – 2023-06-14 (×2): qty 30, 30d supply, fill #0

## 2023-03-06 MED ORDER — SPIRONOLACTONE 25 MG PO TABS
25.0000 mg | ORAL_TABLET | Freq: Every day | ORAL | 2 refills | Status: DC
  Filled 2023-03-06: qty 90, 90d supply, fill #0

## 2023-03-06 MED ORDER — ENTRESTO 97-103 MG PO TABS
1.0000 | ORAL_TABLET | Freq: Two times a day (BID) | ORAL | 0 refills | Status: DC
  Filled 2023-03-06: qty 60, 30d supply, fill #0

## 2023-03-06 MED ORDER — TORSEMIDE 20 MG PO TABS
20.0000 mg | ORAL_TABLET | Freq: Every day | ORAL | 3 refills | Status: DC
  Filled 2023-03-06: qty 84, 84d supply, fill #0
  Filled 2023-03-06: qty 6, 6d supply, fill #0

## 2023-05-15 ENCOUNTER — Other Ambulatory Visit (HOSPITAL_COMMUNITY): Payer: Self-pay

## 2023-05-15 DIAGNOSIS — D649 Anemia, unspecified: Secondary | ICD-10-CM | POA: Diagnosis not present

## 2023-05-15 DIAGNOSIS — N1831 Chronic kidney disease, stage 3a: Secondary | ICD-10-CM | POA: Diagnosis not present

## 2023-05-15 DIAGNOSIS — I5040 Unspecified combined systolic (congestive) and diastolic (congestive) heart failure: Secondary | ICD-10-CM | POA: Diagnosis not present

## 2023-05-15 DIAGNOSIS — M109 Gout, unspecified: Secondary | ICD-10-CM | POA: Diagnosis not present

## 2023-05-15 MED ORDER — POTASSIUM CHLORIDE CRYS ER 10 MEQ PO TBCR
10.0000 meq | EXTENDED_RELEASE_TABLET | Freq: Two times a day (BID) | ORAL | 1 refills | Status: DC
  Filled 2023-06-14: qty 60, 30d supply, fill #0
  Filled 2023-09-23: qty 60, 30d supply, fill #1

## 2023-05-15 MED ORDER — CARVEDILOL 25 MG PO TABS
25.0000 mg | ORAL_TABLET | Freq: Two times a day (BID) | ORAL | 1 refills | Status: DC
  Filled 2023-06-14: qty 60, 30d supply, fill #0

## 2023-05-15 MED ORDER — SPIRONOLACTONE 25 MG PO TABS
25.0000 mg | ORAL_TABLET | Freq: Every day | ORAL | 1 refills | Status: DC
  Filled 2023-06-14: qty 30, 30d supply, fill #0

## 2023-05-15 MED ORDER — ENTRESTO 97-103 MG PO TABS
1.0000 | ORAL_TABLET | Freq: Two times a day (BID) | ORAL | 1 refills | Status: DC
  Filled 2023-05-15: qty 60, 30d supply, fill #0
  Filled 2023-06-14: qty 60, 30d supply, fill #1

## 2023-05-15 MED ORDER — TORSEMIDE 20 MG PO TABS: 20.0000 mg | ORAL_TABLET | Freq: Every day | ORAL | 1 refills | Status: DC

## 2023-05-28 DIAGNOSIS — S0990XA Unspecified injury of head, initial encounter: Secondary | ICD-10-CM | POA: Diagnosis not present

## 2023-05-28 DIAGNOSIS — I10 Essential (primary) hypertension: Secondary | ICD-10-CM | POA: Diagnosis not present

## 2023-05-28 DIAGNOSIS — E872 Acidosis, unspecified: Secondary | ICD-10-CM | POA: Diagnosis not present

## 2023-05-28 DIAGNOSIS — F1010 Alcohol abuse, uncomplicated: Secondary | ICD-10-CM | POA: Diagnosis not present

## 2023-05-28 DIAGNOSIS — R7989 Other specified abnormal findings of blood chemistry: Secondary | ICD-10-CM | POA: Diagnosis not present

## 2023-05-28 DIAGNOSIS — R780 Finding of alcohol in blood: Secondary | ICD-10-CM | POA: Diagnosis not present

## 2023-05-28 DIAGNOSIS — I959 Hypotension, unspecified: Secondary | ICD-10-CM | POA: Diagnosis not present

## 2023-05-28 DIAGNOSIS — N179 Acute kidney failure, unspecified: Secondary | ICD-10-CM | POA: Diagnosis not present

## 2023-05-28 DIAGNOSIS — R55 Syncope and collapse: Secondary | ICD-10-CM | POA: Diagnosis not present

## 2023-05-29 DIAGNOSIS — R55 Syncope and collapse: Secondary | ICD-10-CM | POA: Diagnosis not present

## 2023-06-14 ENCOUNTER — Other Ambulatory Visit: Payer: Self-pay

## 2023-06-14 ENCOUNTER — Other Ambulatory Visit (HOSPITAL_COMMUNITY): Payer: Self-pay

## 2023-06-17 ENCOUNTER — Other Ambulatory Visit (HOSPITAL_COMMUNITY): Payer: Self-pay

## 2023-06-18 ENCOUNTER — Other Ambulatory Visit (HOSPITAL_COMMUNITY): Payer: Self-pay

## 2023-06-18 MED ORDER — ALLOPURINOL 300 MG PO TABS
300.0000 mg | ORAL_TABLET | Freq: Every day | ORAL | 0 refills | Status: DC
  Filled 2023-06-18: qty 30, 30d supply, fill #0

## 2023-06-21 ENCOUNTER — Other Ambulatory Visit (HOSPITAL_COMMUNITY): Payer: Self-pay

## 2023-06-21 DIAGNOSIS — N400 Enlarged prostate without lower urinary tract symptoms: Secondary | ICD-10-CM | POA: Diagnosis not present

## 2023-06-21 DIAGNOSIS — R972 Elevated prostate specific antigen [PSA]: Secondary | ICD-10-CM | POA: Diagnosis not present

## 2023-06-21 DIAGNOSIS — R3911 Hesitancy of micturition: Secondary | ICD-10-CM | POA: Diagnosis not present

## 2023-06-21 MED ORDER — TAMSULOSIN HCL 0.4 MG PO CAPS
0.4000 mg | ORAL_CAPSULE | Freq: Every day | ORAL | 2 refills | Status: DC
  Filled 2023-06-21: qty 30, 30d supply, fill #0
  Filled 2023-07-15: qty 30, 30d supply, fill #1
  Filled 2023-08-07 – 2023-08-08 (×2): qty 30, 30d supply, fill #2

## 2023-06-24 ENCOUNTER — Other Ambulatory Visit (HOSPITAL_COMMUNITY): Payer: Self-pay

## 2023-07-11 ENCOUNTER — Other Ambulatory Visit (HOSPITAL_COMMUNITY): Payer: Self-pay

## 2023-07-11 MED ORDER — ALLOPURINOL 300 MG PO TABS
300.0000 mg | ORAL_TABLET | Freq: Every day | ORAL | 0 refills | Status: DC
  Filled 2023-07-11: qty 30, 30d supply, fill #0

## 2023-07-11 MED ORDER — SPIRONOLACTONE 25 MG PO TABS
25.0000 mg | ORAL_TABLET | Freq: Every day | ORAL | 1 refills | Status: DC
  Filled 2023-07-11: qty 30, 30d supply, fill #0
  Filled 2023-08-12: qty 30, 30d supply, fill #1

## 2023-07-15 ENCOUNTER — Other Ambulatory Visit (HOSPITAL_COMMUNITY): Payer: Self-pay

## 2023-08-06 NOTE — Progress Notes (Deleted)
Cardiology Office Note:   Date:  08/06/2023  NAME:  Christopher [name]    MRN: [id number] DOB:  10/21/1966   PCP:  Kirby Funk, MD (Inactive)  Cardiologist:  None  Electrophysiologist:  None   Referring MD: Renford Dills, MD   No chief complaint on file.   History of Present Illness:   Christopher [name] is a 57 y.o. male with a hx of CHF, HTN, pHTN who is being seen today for the evaluation of CHF at the request of Renford Dills, MD.  Problem List Systolic HF -EF 20-25% 05/2019 -attributed to etoh 2. pHTN 3. HTN 4. Etoh  Past Medical History: Past Medical History:  Diagnosis Date   CHF (congestive heart failure) (HCC)    Gout    Hypertension     Past Surgical History: Past Surgical History:  Procedure Laterality Date   BIOPSY  08/30/2020   Procedure: BIOPSY;  Surgeon: Jeani Hawking, MD;  Location: [facility];  Service: Endoscopy;;   ESOPHAGOGASTRODUODENOSCOPY (EGD) WITH PROPOFOL N/A 08/30/2020   Procedure: ESOPHAGOGASTRODUODENOSCOPY (EGD) WITH PROPOFOL;  Surgeon: Jeani Hawking, MD;  Location: [facility];  Service: Endoscopy;  Laterality: N/A;    Current Medications: No outpatient medications have been marked as taking for the 08/08/23 encounter (Appointment) with O'Neal, Ronnald Ramp, MD.     Allergies:    Patient has no known allergies.   Social History: Social History   Socioeconomic History   Marital status: Single    Spouse name: Not on file   Number of children: 1   Years of education: Not on file   Highest education level: Not on file  Occupational History   Not on file  Tobacco Use   Smoking status: Former    Current packs/day: 0.00    Average packs/day: 0.3 packs/day for 4.0 years (1.0 ttl pk-yrs)    Types: Cigarettes    Start date: 05/05/2005    Quit date: 05/05/2009    Years since quitting: 14.2   Smokeless tobacco: Never  Vaping Use   Vaping status: Never Used  Substance and Sexual Activity   Alcohol use: Yes    Comment: beer qod   Drug  use: Not Currently   Sexual activity: Not on file  Other Topics Concern   Not on file  Social History Narrative   Not on file   Social Determinants of Health   Financial Resource Strain: Low Risk  (05/28/2023)   Received from [place] [facility] [place], [facility] [employer]   Overall [employer] (CARDIA)    Difficulty of Paying Living Expenses: Not hard at all  Food Insecurity: No Food Insecurity (05/28/2023)   Received from [place] [facility] [place], [facility] [employer] Vital Sign    Worried About Programme researcher, broadcasting/film/video in the Last Year: Never true    Ran Out of Food in the Last Year: Never true  Transportation Needs: No Transportation Needs (05/28/2023)   Received from [place] [facility] [place], [facility] [employer] - Administrator, Civil Service (Medical): No    Lack of Transportation (Non-Medical): No  Physical Activity: Not on file  Stress: Not on file  Social Connections: Not on file     Family History: [name] family history includes Hypertension in his mother.  ROS:   All other ROS reviewed and negative. Pertinent positives noted in the HPI.     EKGs/Labs/Other Studies Reviewed:   The following studies were  personally reviewed by me today:  EKG:  EKG is *** ordered today.        Recent Labs: No results found for requested labs within last 365 days.   Recent Lipid Panel No results found for: "CHOL", "TRIG", "HDL", "CHOLHDL", "VLDL", "LDLCALC", "LDLDIRECT"  Physical Exam:   VS:  There were no vitals taken for this visit.   Wt Readings from Last 3 Encounters:  08/31/20 263 lb 7.2 oz (119.5 kg)  06/18/19 280 lb (127 kg)  06/04/19 (!) 310 lb (140.6 kg)    General: Well nourished, well developed, in no acute distress Head: Atraumatic, normal size  Eyes: PEERLA, EOMI  Neck: Supple, no JVD Endocrine: No thryomegaly Cardiac: Normal S1, S2; RRR; no murmurs, rubs, or gallops Lungs:  Clear to auscultation bilaterally, no wheezing, rhonchi or rales  Abd: Soft, nontender, no hepatomegaly  Ext: No edema, pulses 2+ Musculoskeletal: No deformities, BUE and BLE strength normal and equal Skin: Warm and dry, no rashes   Neuro: Alert and oriented to person, place, time, and situation, CNII-XII grossly intact, no focal deficits  Psych: Normal mood and affect   ASSESSMENT:   Christopher [name] is a 57 y.o. male who presents for the following: No diagnosis found.  PLAN:   There are no diagnoses linked to this encounter.  {Are you ordering a CV Procedure (e.g. stress test, cath, DCCV, TEE, etc)?   Press F2        :[id number]}  Disposition: No follow-ups on file.  Medication Adjustments/Labs and Tests Ordered: Current medicines are reviewed at length with the patient today.  Concerns regarding medicines are outlined above.  No orders of the defined types were placed in this encounter.  No orders of the defined types were placed in this encounter.  There are no Patient Instructions on file for this visit.   Time Spent with Patient: I have spent a total of *** minutes with patient reviewing hospital notes, telemetry, EKGs, labs and examining the patient as well as establishing an assessment and plan that was discussed with the patient.  > 50% of time was spent in direct patient care.  Signed, Lenna Gilford. Flora Lipps, MD, [facility]  [facility]  [address] [place], [place] [address] [phone]  08/06/2023 12:15 PM

## 2023-08-07 ENCOUNTER — Other Ambulatory Visit (HOSPITAL_COMMUNITY): Payer: Self-pay

## 2023-08-08 ENCOUNTER — Encounter: Payer: Self-pay | Admitting: Cardiovascular Disease

## 2023-08-08 ENCOUNTER — Ambulatory Visit: Payer: Commercial Managed Care - PPO | Attending: Cardiovascular Disease | Admitting: Cardiovascular Disease

## 2023-08-08 ENCOUNTER — Other Ambulatory Visit (HOSPITAL_COMMUNITY): Payer: Self-pay

## 2023-08-08 DIAGNOSIS — I5022 Chronic systolic (congestive) heart failure: Secondary | ICD-10-CM

## 2023-08-08 DIAGNOSIS — I150 Renovascular hypertension: Secondary | ICD-10-CM

## 2023-08-12 ENCOUNTER — Other Ambulatory Visit (HOSPITAL_COMMUNITY): Payer: Self-pay

## 2023-08-13 ENCOUNTER — Other Ambulatory Visit (HOSPITAL_COMMUNITY): Payer: Self-pay

## 2023-08-13 MED ORDER — ALLOPURINOL 300 MG PO TABS
300.0000 mg | ORAL_TABLET | Freq: Every day | ORAL | 0 refills | Status: DC
  Filled 2023-08-13: qty 30, 30d supply, fill #0

## 2023-08-14 ENCOUNTER — Other Ambulatory Visit (HOSPITAL_COMMUNITY): Payer: Self-pay

## 2023-08-21 ENCOUNTER — Other Ambulatory Visit (HOSPITAL_COMMUNITY): Payer: Self-pay

## 2023-08-21 MED ORDER — ENTRESTO 97-103 MG PO TABS
1.0000 | ORAL_TABLET | Freq: Two times a day (BID) | ORAL | 1 refills | Status: DC
  Filled 2023-08-21: qty 60, 30d supply, fill #0
  Filled 2023-10-03: qty 60, 30d supply, fill #1

## 2023-09-03 ENCOUNTER — Other Ambulatory Visit (HOSPITAL_COMMUNITY): Payer: Self-pay

## 2023-09-04 ENCOUNTER — Other Ambulatory Visit (HOSPITAL_COMMUNITY): Payer: Self-pay

## 2023-09-04 MED ORDER — TAMSULOSIN HCL 0.4 MG PO CAPS
0.4000 mg | ORAL_CAPSULE | Freq: Every day | ORAL | 1 refills | Status: DC
  Filled 2023-09-04: qty 90, 90d supply, fill #0

## 2023-09-08 ENCOUNTER — Other Ambulatory Visit (HOSPITAL_COMMUNITY): Payer: Self-pay

## 2023-09-09 ENCOUNTER — Other Ambulatory Visit (HOSPITAL_COMMUNITY): Payer: Self-pay

## 2023-09-09 MED ORDER — SPIRONOLACTONE 25 MG PO TABS
25.0000 mg | ORAL_TABLET | Freq: Every day | ORAL | 0 refills | Status: DC
  Filled 2023-09-09: qty 90, 90d supply, fill #0

## 2023-09-09 MED ORDER — ALLOPURINOL 300 MG PO TABS
300.0000 mg | ORAL_TABLET | Freq: Every day | ORAL | 0 refills | Status: DC
  Filled 2023-09-09: qty 90, 90d supply, fill #0

## 2023-10-04 ENCOUNTER — Other Ambulatory Visit (HOSPITAL_COMMUNITY): Payer: Self-pay

## 2023-10-10 ENCOUNTER — Other Ambulatory Visit (HOSPITAL_COMMUNITY): Payer: Self-pay

## 2023-10-10 ENCOUNTER — Other Ambulatory Visit: Payer: Self-pay

## 2023-10-10 DIAGNOSIS — R000 Tachycardia, unspecified: Secondary | ICD-10-CM | POA: Diagnosis not present

## 2023-10-10 DIAGNOSIS — I10 Essential (primary) hypertension: Secondary | ICD-10-CM | POA: Diagnosis not present

## 2023-10-10 DIAGNOSIS — I420 Dilated cardiomyopathy: Secondary | ICD-10-CM | POA: Diagnosis not present

## 2023-10-10 DIAGNOSIS — I5040 Unspecified combined systolic (congestive) and diastolic (congestive) heart failure: Secondary | ICD-10-CM | POA: Diagnosis not present

## 2023-10-10 DIAGNOSIS — I110 Hypertensive heart disease with heart failure: Secondary | ICD-10-CM | POA: Diagnosis not present

## 2023-10-10 MED ORDER — CARVEDILOL 25 MG PO TABS
25.0000 mg | ORAL_TABLET | Freq: Two times a day (BID) | ORAL | 1 refills | Status: DC
  Filled 2023-10-10: qty 60, 30d supply, fill #0

## 2023-10-10 MED ORDER — TORSEMIDE 20 MG PO TABS
40.0000 mg | ORAL_TABLET | Freq: Every day | ORAL | 1 refills | Status: DC
  Filled 2023-10-10: qty 60, 30d supply, fill #0

## 2023-10-12 ENCOUNTER — Other Ambulatory Visit: Payer: Self-pay

## 2023-10-12 ENCOUNTER — Encounter (HOSPITAL_COMMUNITY): Payer: Self-pay | Admitting: Pulmonary Disease

## 2023-10-12 ENCOUNTER — Emergency Department (HOSPITAL_COMMUNITY): Payer: Commercial Managed Care - PPO

## 2023-10-12 ENCOUNTER — Inpatient Hospital Stay (HOSPITAL_COMMUNITY)
Admission: EM | Admit: 2023-10-12 | Discharge: 2023-10-26 | DRG: 286 | Disposition: A | Discharge status: Hospice | Payer: Commercial Managed Care - PPO | Attending: Cardiology | Admitting: Cardiology

## 2023-10-12 DIAGNOSIS — F10231 Alcohol dependence with withdrawal delirium: Secondary | ICD-10-CM | POA: Diagnosis not present

## 2023-10-12 DIAGNOSIS — Z6832 Body mass index (BMI) 32.0-32.9, adult: Secondary | ICD-10-CM | POA: Diagnosis not present

## 2023-10-12 DIAGNOSIS — N179 Acute kidney failure, unspecified: Secondary | ICD-10-CM | POA: Diagnosis not present

## 2023-10-12 DIAGNOSIS — T82594A Other mechanical complication of infusion catheter, initial encounter: Secondary | ICD-10-CM | POA: Diagnosis not present

## 2023-10-12 DIAGNOSIS — R918 Other nonspecific abnormal finding of lung field: Secondary | ICD-10-CM | POA: Diagnosis not present

## 2023-10-12 DIAGNOSIS — J90 Pleural effusion, not elsewhere classified: Secondary | ICD-10-CM | POA: Diagnosis not present

## 2023-10-12 DIAGNOSIS — I517 Cardiomegaly: Secondary | ICD-10-CM | POA: Diagnosis not present

## 2023-10-12 DIAGNOSIS — G928 Other toxic encephalopathy: Secondary | ICD-10-CM | POA: Diagnosis not present

## 2023-10-12 DIAGNOSIS — Z7982 Long term (current) use of aspirin: Secondary | ICD-10-CM

## 2023-10-12 DIAGNOSIS — I130 Hypertensive heart and chronic kidney disease with heart failure and stage 1 through stage 4 chronic kidney disease, or unspecified chronic kidney disease: Secondary | ICD-10-CM | POA: Diagnosis not present

## 2023-10-12 DIAGNOSIS — D6489 Other specified anemias: Secondary | ICD-10-CM | POA: Diagnosis present

## 2023-10-12 DIAGNOSIS — F17200 Nicotine dependence, unspecified, uncomplicated: Secondary | ICD-10-CM | POA: Diagnosis present

## 2023-10-12 DIAGNOSIS — I5043 Acute on chronic combined systolic (congestive) and diastolic (congestive) heart failure: Secondary | ICD-10-CM | POA: Diagnosis present

## 2023-10-12 DIAGNOSIS — I426 Alcoholic cardiomyopathy: Secondary | ICD-10-CM | POA: Diagnosis present

## 2023-10-12 DIAGNOSIS — R0989 Other specified symptoms and signs involving the circulatory and respiratory systems: Secondary | ICD-10-CM | POA: Diagnosis not present

## 2023-10-12 DIAGNOSIS — J9811 Atelectasis: Secondary | ICD-10-CM | POA: Diagnosis present

## 2023-10-12 DIAGNOSIS — Z7189 Other specified counseling: Secondary | ICD-10-CM | POA: Diagnosis not present

## 2023-10-12 DIAGNOSIS — E875 Hyperkalemia: Secondary | ICD-10-CM | POA: Diagnosis present

## 2023-10-12 DIAGNOSIS — I5042 Chronic combined systolic (congestive) and diastolic (congestive) heart failure: Secondary | ICD-10-CM | POA: Diagnosis not present

## 2023-10-12 DIAGNOSIS — Z515 Encounter for palliative care: Secondary | ICD-10-CM | POA: Diagnosis not present

## 2023-10-12 DIAGNOSIS — R570 Cardiogenic shock: Principal | ICD-10-CM

## 2023-10-12 DIAGNOSIS — I5084 End stage heart failure: Secondary | ICD-10-CM | POA: Diagnosis not present

## 2023-10-12 DIAGNOSIS — E669 Obesity, unspecified: Secondary | ICD-10-CM | POA: Diagnosis present

## 2023-10-12 DIAGNOSIS — I493 Ventricular premature depolarization: Secondary | ICD-10-CM | POA: Diagnosis not present

## 2023-10-12 DIAGNOSIS — E876 Hypokalemia: Secondary | ICD-10-CM | POA: Diagnosis not present

## 2023-10-12 DIAGNOSIS — Z1152 Encounter for screening for COVID-19: Secondary | ICD-10-CM | POA: Diagnosis not present

## 2023-10-12 DIAGNOSIS — F10931 Alcohol use, unspecified with withdrawal delirium: Secondary | ICD-10-CM | POA: Diagnosis not present

## 2023-10-12 DIAGNOSIS — E871 Hypo-osmolality and hyponatremia: Secondary | ICD-10-CM | POA: Diagnosis present

## 2023-10-12 DIAGNOSIS — E860 Dehydration: Secondary | ICD-10-CM | POA: Diagnosis present

## 2023-10-12 DIAGNOSIS — K7030 Alcoholic cirrhosis of liver without ascites: Secondary | ICD-10-CM | POA: Diagnosis not present

## 2023-10-12 DIAGNOSIS — E872 Acidosis, unspecified: Secondary | ICD-10-CM | POA: Diagnosis present

## 2023-10-12 DIAGNOSIS — I5082 Biventricular heart failure: Secondary | ICD-10-CM | POA: Diagnosis present

## 2023-10-12 DIAGNOSIS — R188 Other ascites: Secondary | ICD-10-CM | POA: Diagnosis not present

## 2023-10-12 DIAGNOSIS — K7689 Other specified diseases of liver: Secondary | ICD-10-CM | POA: Diagnosis not present

## 2023-10-12 DIAGNOSIS — R531 Weakness: Secondary | ICD-10-CM | POA: Diagnosis not present

## 2023-10-12 DIAGNOSIS — K7200 Acute and subacute hepatic failure without coma: Secondary | ICD-10-CM | POA: Diagnosis not present

## 2023-10-12 DIAGNOSIS — F1010 Alcohol abuse, uncomplicated: Secondary | ICD-10-CM | POA: Diagnosis not present

## 2023-10-12 DIAGNOSIS — E7800 Pure hypercholesterolemia, unspecified: Secondary | ICD-10-CM | POA: Diagnosis present

## 2023-10-12 DIAGNOSIS — M109 Gout, unspecified: Secondary | ICD-10-CM | POA: Diagnosis present

## 2023-10-12 DIAGNOSIS — R609 Edema, unspecified: Secondary | ICD-10-CM | POA: Diagnosis not present

## 2023-10-12 DIAGNOSIS — I5023 Acute on chronic systolic (congestive) heart failure: Secondary | ICD-10-CM | POA: Diagnosis present

## 2023-10-12 DIAGNOSIS — D6959 Other secondary thrombocytopenia: Secondary | ICD-10-CM | POA: Diagnosis present

## 2023-10-12 DIAGNOSIS — I272 Pulmonary hypertension, unspecified: Secondary | ICD-10-CM | POA: Diagnosis present

## 2023-10-12 DIAGNOSIS — Z452 Encounter for adjustment and management of vascular access device: Secondary | ICD-10-CM | POA: Diagnosis not present

## 2023-10-12 DIAGNOSIS — I472 Ventricular tachycardia, unspecified: Secondary | ICD-10-CM | POA: Diagnosis not present

## 2023-10-12 DIAGNOSIS — R001 Bradycardia, unspecified: Secondary | ICD-10-CM | POA: Diagnosis not present

## 2023-10-12 DIAGNOSIS — I509 Heart failure, unspecified: Secondary | ICD-10-CM | POA: Diagnosis not present

## 2023-10-12 DIAGNOSIS — R5383 Other fatigue: Secondary | ICD-10-CM | POA: Diagnosis not present

## 2023-10-12 DIAGNOSIS — R0602 Shortness of breath: Secondary | ICD-10-CM | POA: Diagnosis present

## 2023-10-12 DIAGNOSIS — K8020 Calculus of gallbladder without cholecystitis without obstruction: Secondary | ICD-10-CM | POA: Diagnosis not present

## 2023-10-12 DIAGNOSIS — Z66 Do not resuscitate: Secondary | ICD-10-CM | POA: Diagnosis not present

## 2023-10-12 DIAGNOSIS — I420 Dilated cardiomyopathy: Secondary | ICD-10-CM | POA: Diagnosis present

## 2023-10-12 DIAGNOSIS — Z79899 Other long term (current) drug therapy: Secondary | ICD-10-CM

## 2023-10-12 DIAGNOSIS — Z8249 Family history of ischemic heart disease and other diseases of the circulatory system: Secondary | ICD-10-CM

## 2023-10-12 DIAGNOSIS — N170 Acute kidney failure with tubular necrosis: Secondary | ICD-10-CM

## 2023-10-12 DIAGNOSIS — R000 Tachycardia, unspecified: Secondary | ICD-10-CM | POA: Diagnosis not present

## 2023-10-12 DIAGNOSIS — N1831 Chronic kidney disease, stage 3a: Secondary | ICD-10-CM | POA: Diagnosis not present

## 2023-10-12 HISTORY — DX: Chronic kidney disease, stage 3a: N18.31

## 2023-10-12 HISTORY — DX: Personal history of nicotine dependence: Z87.891

## 2023-10-12 HISTORY — DX: Alcohol use, unspecified, uncomplicated: F10.90

## 2023-10-12 HISTORY — DX: Dilated cardiomyopathy: I42.0

## 2023-10-12 HISTORY — DX: Other specified health status: Z78.9

## 2023-10-12 LAB — BASIC METABOLIC PANEL
Anion gap: 13 (ref 5–15)
Anion gap: 14 (ref 5–15)
BUN: 62 mg/dL — ABNORMAL HIGH (ref 6–20)
BUN: 67 mg/dL — ABNORMAL HIGH (ref 6–20)
CO2: 22 mmol/L (ref 22–32)
CO2: 19 mmol/L — ABNORMAL LOW (ref 22–32)
Calcium: 8.9 mg/dL (ref 8.9–10.3)
Calcium: 8.5 mg/dL — ABNORMAL LOW (ref 8.9–10.3)
Chloride: 101 mmol/L (ref 98–111)
Chloride: 99 mmol/L (ref 98–111)
Creatinine, Ser: 3.39 mg/dL — ABNORMAL HIGH (ref 0.61–1.24)
Creatinine, Ser: 3.46 mg/dL — ABNORMAL HIGH (ref 0.61–1.24)
GFR, Estimated: 20 mL/min — ABNORMAL LOW (ref >60)
GFR, Estimated: 20 mL/min — ABNORMAL LOW (ref >60)
Glucose, Bld: 128 mg/dL — ABNORMAL HIGH (ref 70–99)
Glucose, Bld: 172 mg/dL — ABNORMAL HIGH (ref 70–99)
Potassium: 5.2 mmol/L — ABNORMAL HIGH (ref 3.5–5.1)
Potassium: 4.8 mmol/L (ref 3.5–5.1)
Sodium: 136 mmol/L (ref 135–145)
Sodium: 132 mmol/L — ABNORMAL LOW (ref 135–145)

## 2023-10-12 LAB — CBC
HCT: 34.7 % — ABNORMAL LOW (ref 39.0–52.0)
Hemoglobin: 11.1 g/dL — ABNORMAL LOW (ref 13.0–17.0)
MCH: 34.3 pg — ABNORMAL HIGH (ref 26.0–34.0)
MCHC: 32 g/dL (ref 30.0–36.0)
MCV: 107.1 fL — ABNORMAL HIGH (ref 80.0–100.0)
Platelets: 98 10*3/uL — ABNORMAL LOW (ref 150–400)
RBC: 3.24 MIL/uL — ABNORMAL LOW (ref 4.22–5.81)
RDW: 14.1 % (ref 11.5–15.5)
WBC: 5.6 10*3/uL (ref 4.0–10.5)
nRBC: 0 % (ref 0.0–0.2)

## 2023-10-12 LAB — COMPREHENSIVE METABOLIC PANEL
ALT: 21 U/L (ref 0–44)
AST: 41 U/L (ref 15–41)
Albumin: 3.6 g/dL (ref 3.5–5.0)
Alkaline Phosphatase: 98 U/L (ref 38–126)
Anion gap: 13 (ref 5–15)
BUN: 60 mg/dL — ABNORMAL HIGH (ref 6–20)
CO2: 20 mmol/L — ABNORMAL LOW (ref 22–32)
Calcium: 8.9 mg/dL (ref 8.9–10.3)
Chloride: 102 mmol/L (ref 98–111)
Creatinine, Ser: 3.37 mg/dL — ABNORMAL HIGH (ref 0.61–1.24)
GFR, Estimated: 20 mL/min — ABNORMAL LOW (ref >60)
Glucose, Bld: 135 mg/dL — ABNORMAL HIGH (ref 70–99)
Potassium: 5.1 mmol/L (ref 3.5–5.1)
Sodium: 135 mmol/L (ref 135–145)
Total Bilirubin: 1.5 mg/dL — ABNORMAL HIGH (ref 0.3–1.2)
Total Protein: 6.8 g/dL (ref 6.5–8.1)

## 2023-10-12 LAB — LACTIC ACID, PLASMA
Lactic Acid, Venous: 1.5 mmol/L (ref 0.5–1.9)
Lactic Acid, Venous: 1 mmol/L (ref 0.5–1.9)

## 2023-10-12 LAB — I-STAT CG4 LACTIC ACID, ED: Lactic Acid, Venous: 2 mmol/L (ref 0.5–1.9)

## 2023-10-12 LAB — PROCALCITONIN: Procalcitonin: <0.1 ng/mL

## 2023-10-12 LAB — COOXEMETRY PANEL
Carboxyhemoglobin: 1 % (ref 0.5–1.5)
Carboxyhemoglobin: 1.5 % (ref 0.5–1.5)
Methemoglobin: <0.7 % (ref 0.0–1.5)
Methemoglobin: <0.7 % (ref 0.0–1.5)
O2 Saturation: 35 %
O2 Saturation: 61.8 %
Total hemoglobin: 10.7 g/dL — ABNORMAL LOW (ref 12.0–16.0)
Total hemoglobin: 9.7 g/dL — ABNORMAL LOW (ref 12.0–16.0)

## 2023-10-12 LAB — GLUCOSE, CAPILLARY
Glucose-Capillary: 130 mg/dL — ABNORMAL HIGH (ref 70–99)
Glucose-Capillary: 114 mg/dL — ABNORMAL HIGH (ref 70–99)
Glucose-Capillary: 109 mg/dL — ABNORMAL HIGH (ref 70–99)

## 2023-10-12 LAB — I-STAT CHEM 8, ED
BUN: 59 mg/dL — ABNORMAL HIGH (ref 6–20)
Calcium, Ion: 1.09 mmol/L — ABNORMAL LOW (ref 1.15–1.40)
Chloride: 105 mmol/L (ref 98–111)
Creatinine, Ser: 3.7 mg/dL — ABNORMAL HIGH (ref 0.61–1.24)
Glucose, Bld: 125 mg/dL — ABNORMAL HIGH (ref 70–99)
HCT: 37 % — ABNORMAL LOW (ref 39.0–52.0)
Hemoglobin: 12.6 g/dL — ABNORMAL LOW (ref 13.0–17.0)
Potassium: 5.2 mmol/L — ABNORMAL HIGH (ref 3.5–5.1)
Sodium: 135 mmol/L (ref 135–145)
TCO2: 21 mmol/L — ABNORMAL LOW (ref 22–32)

## 2023-10-12 LAB — RESP PANEL BY RT-PCR (RSV, FLU A&B, COVID)  RVPGX2
Influenza A by PCR: NEGATIVE
Influenza B by PCR: NEGATIVE
Resp Syncytial Virus by PCR: NEGATIVE
SARS Coronavirus 2 by RT PCR: NEGATIVE

## 2023-10-12 LAB — TROPONIN I (HIGH SENSITIVITY)
Troponin I (High Sensitivity): 16 ng/L (ref <18)
Troponin I (High Sensitivity): 13 ng/L (ref <18)

## 2023-10-12 LAB — CG4 I-STAT (LACTIC ACID): Lactic Acid, Venous: 0.8 mmol/L (ref 0.5–1.9)

## 2023-10-12 LAB — ETHANOL: Alcohol, Ethyl (B): <10 mg/dL (ref <10)

## 2023-10-12 LAB — BRAIN NATRIURETIC PEPTIDE: B Natriuretic Peptide: >4500 pg/mL — ABNORMAL HIGH (ref 0.0–100.0)

## 2023-10-12 LAB — MRSA NEXT GEN BY PCR, NASAL: MRSA by PCR Next Gen: NOT DETECTED

## 2023-10-12 MED ORDER — HEPARIN SODIUM (PORCINE) 5000 UNIT/ML IJ SOLN
5000.0000 [IU] | Freq: Three times a day (TID) | INTRAMUSCULAR | Status: DC
  Administered 2023-10-13 – 2023-10-26 (×38): 5000 [IU] via SUBCUTANEOUS
  Filled 2023-10-12 (×38): qty 1

## 2023-10-12 MED ORDER — NOREPINEPHRINE 4 MG/250ML-% IV SOLN: 0.0000 ug/min | INTRAVENOUS | Status: DC

## 2023-10-12 MED ORDER — FUROSEMIDE 10 MG/ML IJ SOLN
160.0000 mg | Freq: Once | INTRAVENOUS | Status: AC
  Administered 2023-10-12: 160 mg via INTRAVENOUS
  Filled 2023-10-12: qty 10

## 2023-10-12 MED ORDER — DOPAMINE-DEXTROSE 3.2-5 MG/ML-% IV SOLN
0.0000 ug/kg/min | INTRAVENOUS | Status: DC
  Administered 2023-10-12: 5 ug/kg/min via INTRAVENOUS
  Filled 2023-10-12: qty 250

## 2023-10-12 MED ORDER — FUROSEMIDE 10 MG/ML IJ SOLN
15.0000 mg/h | INTRAVENOUS | Status: AC
  Administered 2023-10-12: 15 mg/h via INTRAVENOUS
  Administered 2023-10-12: 4 mg/h via INTRAVENOUS
  Administered 2023-10-13 – 2023-10-14 (×3): 15 mg/h via INTRAVENOUS
  Filled 2023-10-12 (×5): qty 20

## 2023-10-12 MED ORDER — SODIUM CHLORIDE 0.9 % IV BOLUS
250.0000 mL | Freq: Once | INTRAVENOUS | Status: AC
  Administered 2023-10-12: 250 mL via INTRAVENOUS

## 2023-10-12 MED ORDER — THIAMINE MONONITRATE 100 MG PO TABS
100.0000 mg | ORAL_TABLET | Freq: Every day | ORAL | Status: DC
  Administered 2023-10-12 – 2023-10-26 (×13): 100 mg via ORAL
  Filled 2023-10-12 (×13): qty 1

## 2023-10-12 MED ORDER — FOLIC ACID 1 MG PO TABS
1.0000 mg | ORAL_TABLET | Freq: Every day | ORAL | Status: DC
  Administered 2023-10-12 – 2023-10-26 (×14): 1 mg via ORAL
  Filled 2023-10-12 (×14): qty 1

## 2023-10-12 MED ORDER — LORAZEPAM 2 MG/ML IJ SOLN
1.0000 mg | INTRAMUSCULAR | Status: DC | PRN
  Administered 2023-10-13 (×2): 2 mg via INTRAVENOUS
  Administered 2023-10-14: 4 mg via INTRAVENOUS
  Administered 2023-10-14: 3 mg via INTRAVENOUS
  Administered 2023-10-14: 4 mg via INTRAVENOUS
  Administered 2023-10-14: 2 mg via INTRAVENOUS
  Administered 2023-10-14: 4 mg via INTRAVENOUS
  Filled 2023-10-12: qty 2
  Filled 2023-10-12 (×2): qty 1
  Filled 2023-10-12: qty 2
  Filled 2023-10-12: qty 1
  Filled 2023-10-12 (×2): qty 2

## 2023-10-12 MED ORDER — SODIUM CHLORIDE 0.9% FLUSH: 10.0000 mL | INTRAVENOUS | Status: DC | PRN

## 2023-10-12 MED ORDER — NOREPINEPHRINE 4 MG/250ML-% IV SOLN
2.0000 ug/min | INTRAVENOUS | Status: DC
  Administered 2023-10-12: 10 ug/min via INTRAVENOUS
  Administered 2023-10-12: 6 ug/min via INTRAVENOUS
  Filled 2023-10-12: qty 250

## 2023-10-12 MED ORDER — LORAZEPAM 1 MG PO TABS
1.0000 mg | ORAL_TABLET | ORAL | Status: DC | PRN
  Administered 2023-10-13: 2 mg via ORAL
  Filled 2023-10-12: qty 2

## 2023-10-12 MED ORDER — SODIUM CHLORIDE 0.9 % IV SOLN
250.0000 mL | INTRAVENOUS | Status: DC
  Administered 2023-10-12: 250 mL via INTRAVENOUS

## 2023-10-12 MED ORDER — DOBUTAMINE-DEXTROSE 4-5 MG/ML-% IV SOLN
7.5000 ug/kg/min | INTRAVENOUS | Status: DC
  Administered 2023-10-12 – 2023-10-21 (×6): 5 ug/kg/min via INTRAVENOUS
  Administered 2023-10-22 – 2023-10-25 (×5): 7.5 ug/kg/min via INTRAVENOUS
  Filled 2023-10-12 (×12): qty 250

## 2023-10-12 MED ORDER — POLYETHYLENE GLYCOL 3350 17 G PO PACK: 17.0000 g | PACK | Freq: Every day | ORAL | Status: DC | PRN

## 2023-10-12 MED ORDER — ADULT MULTIVITAMIN W/MINERALS CH
1.0000 | ORAL_TABLET | Freq: Every day | ORAL | Status: DC
  Administered 2023-10-12 – 2023-10-26 (×14): 1 via ORAL
  Filled 2023-10-12 (×14): qty 1

## 2023-10-12 MED ORDER — DOCUSATE SODIUM 100 MG PO CAPS: 100.0000 mg | ORAL_CAPSULE | Freq: Two times a day (BID) | ORAL | Status: DC | PRN

## 2023-10-12 MED ORDER — THIAMINE HCL 100 MG/ML IJ SOLN
100.0000 mg | Freq: Every day | INTRAMUSCULAR | Status: DC
  Administered 2023-10-14 – 2023-10-19 (×2): 100 mg via INTRAVENOUS
  Filled 2023-10-12 (×2): qty 2

## 2023-10-12 MED ORDER — ORAL CARE MOUTH RINSE: 15.0000 mL | OROMUCOSAL | Status: DC | PRN

## 2023-10-12 MED ORDER — INSULIN ASPART 100 UNIT/ML IJ SOLN
0.0000 [IU] | INTRAMUSCULAR | Status: DC
  Administered 2023-10-12: 2 [IU] via SUBCUTANEOUS
  Administered 2023-10-13: 1 [IU] via SUBCUTANEOUS
  Administered 2023-10-16: 3 [IU] via SUBCUTANEOUS
  Administered 2023-10-16: 1 [IU] via SUBCUTANEOUS
  Administered 2023-10-16: 2 [IU] via SUBCUTANEOUS

## 2023-10-12 MED ORDER — NOREPINEPHRINE 4 MG/250ML-% IV SOLN
2.0000 ug/min | INTRAVENOUS | Status: DC
  Administered 2023-10-12: 2 ug/min via INTRAVENOUS
  Filled 2023-10-12: qty 250

## 2023-10-12 MED ORDER — SODIUM CHLORIDE 0.9 % IV SOLN: 250.0000 mL | INTRAVENOUS | Status: AC

## 2023-10-12 MED ORDER — CHLORHEXIDINE GLUCONATE CLOTH 2 % EX PADS
6.0000 | MEDICATED_PAD | Freq: Every day | CUTANEOUS | Status: DC
  Administered 2023-10-12 – 2023-10-26 (×15): 6 via TOPICAL

## 2023-10-12 MED ORDER — SODIUM CHLORIDE 0.9% FLUSH
10.0000 mL | Freq: Two times a day (BID) | INTRAVENOUS | Status: DC
  Administered 2023-10-12: 10 mL
  Administered 2023-10-12: 30 mL
  Administered 2023-10-13 – 2023-10-14 (×2): 10 mL
  Administered 2023-10-14: 30 mL
  Administered 2023-10-15: 10 mL
  Administered 2023-10-15: 30 mL
  Administered 2023-10-16 – 2023-10-18 (×6): 10 mL
  Administered 2023-10-19: 20 mL
  Administered 2023-10-19 – 2023-10-26 (×5): 10 mL

## 2023-10-12 NOTE — ED Provider Notes (Signed)
[facility] AT [facility] Provider Note   CSN: [id number] Arrival date & time: 10/12/23  0449     History  Chief Complaint  Patient presents with   Hypotension   Fatigue   Shortness of Breath   Level 5 caveat due to acuity of condition Christopher [name] is a 57 y.o. male.  The history is provided by the patient and the EMS personnel.  Shortness of Breath  Patient with history of CHF, hypertension, alcohol use disorder presents with shortness of breath.  Patient reports he has felt short of breath for over a month.  It has been gradually worsening.  He was seen this past week as an outpatient and was told to increase his Lasix.  Patient reports tonight it felt worse so he wanted to be evaluated.  He was initially going to drive but then called [phone].  No fevers or vomiting.  No chest pain.  He has had cough but no hemoptysis.  He is also had increased lower extremity edema.  Patient also admits to daily alcohol use, typically beer and vodka He is also an occasional smoker     Patient reports he fell several days ago onto carpet.  No significant head injury, no headache.  No chest or abdominal pain from the fall Past Medical History:  Diagnosis Date   Alcohol use    CHF (congestive heart failure) (HCC)    Dilated cardiomyopathy (HCC)    Gout    Hypertension     Home Medications Prior to Admission medications   Medication Sig Start Date End Date Taking? Authorizing Provider  allopurinol (ZYLOPRIM) 300 MG tablet Take 1 tablet (300 mg total) by mouth daily. 09/09/23  Yes   aspirin EC 81 MG tablet Take 81 mg by mouth every morning.    Yes [provider]  potassium chloride (KLOR-CON M) 10 MEQ tablet Take 1 tablet (10 mEq total) by mouth 2 (two) times daily with food 05/15/23  Yes   potassium chloride (KLOR-CON) 10 MEQ tablet Take 1 tablet (10 mEq total) by mouth every morning. do not take until follow up with PCP/cardiology 08/31/20  Yes Tyrone Nine, MD  sacubitril-valsartan (ENTRESTO) 97-103 MG Take 1 tablet by mouth 2 (two) times daily. 08/21/23  Yes   spironolactone (ALDACTONE) 25 MG tablet Take 1 tablet (25 mg total) by mouth daily. 09/09/23  Yes   tamsulosin (FLOMAX) 0.4 MG CAPS capsule Take 1 capsule (0.4 mg total) by mouth daily. 09/04/23  Yes   torsemide (DEMADEX) 20 MG tablet Take 2 tablets (40 mg total) by mouth daily. 10/10/23  Yes   allopurinol (ZYLOPRIM) 300 MG tablet Take 300 mg by mouth every morning.  03/16/19   [provider]  allopurinol (ZYLOPRIM) 300 MG tablet TAKE 1 TABLET BY MOUTH ONCE DAILY 03/01/20 03/01/21  Kirby Funk, MD  allopurinol (ZYLOPRIM) 300 MG tablet Take 1 tablet (300 mg total) by mouth daily. 04/04/22     allopurinol (ZYLOPRIM) 300 MG tablet Take 1 tablet (300 mg total) by mouth daily. 04/04/22     carvedilol (COREG) 25 MG tablet Take 1 tablet (25 mg total) by mouth every morning. do not take until follow up with PCP/cardiology Patient not taking: Reported on 10/12/2023 08/31/20   Tyrone Nine, MD  carvedilol (COREG) 25 MG tablet TAKE 1 TABLET BY MOUTH TWICE DAILY 03/06/21 05/13/22  Kirby Funk, MD  carvedilol (COREG) 25 MG tablet Take 1 tablet (25 mg total) by mouth  2 (two) times daily. 06/27/22     carvedilol (COREG) 25 MG tablet Take 1 tablet (25 mg total) by mouth 2 (two) times daily. 05/15/23     carvedilol (COREG) 25 MG tablet Take 1 tablet (25 mg total) by mouth 2 (two) times daily. 10/10/23     folic acid (FOLVITE) 1 MG tablet Take 1 tablet (1 mg total) by mouth daily. 10/06/21     folic acid (FOLVITE) 1 MG tablet Take 1 tablet (1 mg total) by mouth daily. Patient not taking: Reported on 10/12/2023 11/21/22     pantoprazole (PROTONIX) 40 MG tablet Take 1 tablet (40 mg total) by mouth 2 (two) times daily. 08/31/20   Tyrone Nine, MD  potassium chloride (KLOR-CON M) 10 MEQ tablet TAKE 1 TABLET BY MOUTH TWICE DAILY WITH FOOD 02/07/21 02/07/22  Kirby Funk, MD  spironolactone (ALDACTONE) 25 MG  tablet Take 1 tablet (25 mg total) by mouth daily. 03/06/23     spironolactone (ALDACTONE) 25 MG tablet Take 1 tablet (25 mg total) by mouth daily. 03/06/23     spironolactone (ALDACTONE) 25 MG tablet Take 1 tablet (25 mg total) by mouth daily. 05/15/23     torsemide (DEMADEX) 20 MG tablet Take 1 tablet (20 mg total) by mouth daily. 03/06/23     torsemide (DEMADEX) 20 MG tablet Take 1 tablet (20 mg total) by mouth daily. 05/15/23         Allergies    Patient has no known allergies.    Review of Systems   Review of Systems  Unable to perform ROS: Acuity of condition  Respiratory:  Positive for shortness of breath.   Genitourinary:  Negative for difficulty urinating.    Physical Exam Updated Vital Signs BP (!) 76/64   Pulse 96   Temp 98 F (36.7 C) (Oral)   Resp (!) 28   SpO2 100%  Physical Exam CONSTITUTIONAL: Ill-appearing HEAD: Normocephalic/atraumatic EYES: EOMI/PERRL ENMT: Mucous membranes moist NECK: supple no meningeal signs, JVD CV: S1/S2 noted, distant heart sounds LUNGS: Lungs are clear to auscultation bilaterally, no apparent distress ABDOMEN: soft, nontender, no bruising NEURO: Pt is awake/alert/appropriate, moves all extremitiesx4.  No facial droop.   EXTREMITIES: pulses normal/equal, full ROM 3+ pitting edema to bilateral lower extremities, distal pulses intact, feet are warm SKIN: warm, color normal  ED Results / Procedures / Treatments   Labs (all labs ordered are listed, but only abnormal results are displayed) Labs Reviewed  COMPREHENSIVE METABOLIC PANEL - Abnormal; Notable for the following components:      Result Value   CO2 20 (*)    Glucose, Bld 135 (*)    BUN 60 (*)    Creatinine, Ser 3.37 (*)    Total Bilirubin 1.5 (*)    GFR, Estimated 20 (*)    All other components within normal limits  CBC - Abnormal; Notable for the following components:   RBC 3.24 (*)    Hemoglobin 11.1 (*)    HCT 34.7 (*)    MCV 107.1 (*)    MCH 34.3 (*)    Platelets 98  (*)    All other components within normal limits  I-STAT CG4 LACTIC ACID, ED - Abnormal; Notable for the following components:   Lactic Acid, Venous 2.0 (*)    All other components within normal limits  I-STAT CHEM 8, ED - Abnormal; Notable for the following components:   Potassium 5.2 (*)    BUN 59 (*)    Creatinine, Ser 3.70 (*)  Glucose, Bld 125 (*)    Calcium, Ion 1.09 (*)    TCO2 21 (*)    Hemoglobin 12.6 (*)    HCT 37.0 (*)    All other components within normal limits  RESP PANEL BY RT-PCR (RSV, FLU A&B, COVID)  RVPGX2  ETHANOL  BRAIN NATRIURETIC PEPTIDE  TROPONIN I (HIGH SENSITIVITY)    EKG EKG Interpretation Date/Time:  Saturday October 12 2023 04:54:37 EDT Ventricular Rate:  96 PR Interval:  187 QRS Duration:  96 QT Interval:  403 QTC Calculation: 510 R Axis:   -18  Text Interpretation: Sinus rhythm Low voltage, extremity leads Nonspecific T abnormalities, anterior leads Prolonged QT interval T wave inversion Abnormal ekg Confirmed by Zadie Rhine ([id number]) on 10/12/2023 5:06:43 AM  Radiology DG Chest Portable 1 View  Result Date: 10/12/2023 CLINICAL DATA:  57 year old male with history of shortness of breath. EXAM: PORTABLE CHEST 1 VIEW COMPARISON:  Chest x-ray 09/04/2018. FINDINGS: Lung volumes are normal. Small right pleural effusion. Opacity at the right base which may reflect atelectasis and/or consolidation. Left lung is clear. No left pleural effusion. No pneumothorax. Cephalization of the pulmonary vasculature, without frank pulmonary edema. Heart size is moderately enlarged. The patient is rotated to the right on today's exam, resulting in distortion of the mediastinal contours and reduced diagnostic sensitivity and specificity for mediastinal pathology. IMPRESSION: 1. Atelectasis and/or consolidation in the right lower lobe with small right pleural effusion. 2. Cardiomegaly with pulmonary venous congestion, but no frank pulmonary edema. Electronically  Signed   By: Trudie Reed M.D.   On: 10/12/2023 06:07    Procedures .Critical Care  Performed by: Zadie Rhine, MD Authorized by: Zadie Rhine, MD   Critical care provider statement:    Critical care time (minutes):  68   Critical care start time:  10/12/2023 5:08 AM   Critical care end time:  10/12/2023 6:16 AM   Critical care time was exclusive of:  Separately billable procedures and treating other patients   Critical care was necessary to treat or prevent imminent or life-threatening deterioration of the following conditions:  Respiratory failure, circulatory failure, cardiac failure and shock   Critical care was time spent personally by me on the following activities:  Obtaining history from patient or surrogate, examination of patient, evaluation of patient's response to treatment, development of treatment plan with patient or surrogate, ordering and review of laboratory studies, ordering and review of radiographic studies, pulse oximetry, re-evaluation of patient's condition, ordering and performing treatments and interventions, review of old charts and discussions with consultants   I assumed direction of critical care for this patient from another provider in my specialty: no     Care discussed with: admitting provider   Ultrasound ED Echo  Date/Time: 10/12/2023 5:30 AM  Performed by: Zadie Rhine, MD Authorized by: Zadie Rhine, MD   Procedure details:    Indications: hypotension and dyspnea     Views: subxiphoid and parasternal long axis view     Images: archived     Limitations:  Body habitus, positioning and patient compliance Findings:    Pericardium: no pericardial effusion     LV Function: severly depressed (<30%)   Impression:    Impression: decreased contractility       Medications Ordered in ED Medications  0.9 %  sodium chloride infusion (250 mLs Intravenous New Bag/Given 10/12/23 0549)  norepinephrine (LEVOPHED) 4mg  in (0.016 mg/mL)  premix infusion (8 mcg/min Intravenous Rate/Dose Change 10/12/23 0610)    ED Course/ Medical  Decision Making/ A&P Clinical Course as of 10/12/23 1610  Sat Oct 12, 2023  0510 Patient presents for shortness of breath has been worse over the past month.  Patient is hypotensive and ill-appearing on arrival.  Labs and imaging are pending at this time [DW]  0549 Suspicion this represents cardiogenic shock.  Patient is awake and alert but is hypotensive and showing signs of worsening CHF.  Bedside ultrasound reveals cardiomegaly but no pericardial effusion.  Will start Levophed for pressure support [DW]  0550 Discussed the case with on-call cardiology Dr. Orson Aloe.  He will consult, but request critical care evaluation [DW]  0602 Discussed with Dr. Jamison Neighbor with critical care Will evaluate the patient  It is possible patient has a combination of overdiuresis from Lasix as well as dehydration from his alcohol use [DW]    Clinical Course User Index [DW] Zadie Rhine, MD                                 Medical Decision Making Amount and/or Complexity of Data Reviewed Labs: ordered. Radiology: ordered.  Risk Prescription drug management. Decision regarding hospitalization.   This patient presents to the ED for concern of shortness of breath, this involves an extensive number of treatment options, and is a complaint that carries with it a high risk of complications and morbidity.  The differential diagnosis includes but is not limited to Acute coronary syndrome, pneumonia, acute pulmonary edema, pneumothorax, acute anemia, pulmonary embolism   Comorbidities that complicate the patient evaluation: Patient's presentation is complicated by their history of cardiomyopathy, hypertension  Social Determinants of Health: Patient's  alcohol use disorder   increases the complexity of managing their presentation  Additional history obtained: Additional history obtained from EMS  Records reviewed  previous admission documents  Lab Tests: I Ordered, and personally interpreted labs.  The pertinent results include: Acute kidney injury  Imaging Studies ordered: I ordered imaging studies including X-ray chest   I independently visualized and interpreted imaging which showed cardiomegaly I agree with the radiologist interpretation  Cardiac Monitoring: The patient was maintained on a cardiac monitor.  I personally viewed and interpreted the cardiac monitor which showed an underlying rhythm of:  sinus rhythm  Medicines ordered and prescription drug management: I ordered medication including norepinephrine for hypotension Reevaluation of the patient after these medicines showed that the patient    stayed the same   Critical Interventions:  levophed IV, ICU admission  Consultations Obtained: I requested consultation with the admitting physician critical care and consultant cardiology , and discussed  findings as well as pertinent plan - they recommend: Admit to ICU  Reevaluation: After the interventions noted above, I reevaluated the patient and found that they have :stayed the same  Complexity of problems addressed: Patient's presentation is most consistent with  acute presentation with potential threat to life or bodily function  Disposition: After consideration of the diagnostic results and the patient's response to treatment,  I feel that the patent would benefit from admission   .           Final Clinical Impression(s) / ED Diagnoses Final diagnoses:  Cardiogenic shock (HCC)  AKI (acute kidney injury) [facility])    Rx / DC Orders ED Discharge Orders     None         Zadie Rhine, MD 10/12/23 [phone]

## 2023-10-12 NOTE — Progress Notes (Signed)
PCCM:  New admission from overnight. I have reviewed my teams documentation. Appears to be in cardiogenic shock with know alcoholic cm   Continue NEPI  MAP goal >55mmHg  Place PICC line Check [place]  Start dobutamine 2.5  Follow electrolytes  On lasix infusion Ok for diet   Christopher Igo, DO [facility] 10/12/2023 8:30 AM

## 2023-10-12 NOTE — Plan of Care (Signed)
  Problem: Education: Goal: Ability to describe self-care measures that may prevent or decrease complications (Diabetes Survival Skills Education) will improve Outcome: Progressing Goal: Individualized Educational Video(s) Outcome: Progressing   Problem: Coping: Goal: Ability to adjust to condition or change in health will improve Outcome: Progressing   Problem: Health Behavior/Discharge Planning: Goal: Ability to identify and utilize available resources and services will improve Outcome: Progressing Goal: Ability to manage health-related needs will improve Outcome: Progressing   Problem: Fluid Volume: Goal: Ability to maintain a balanced intake and output will improve Outcome: Progressing   Problem: Metabolic: Goal: Ability to maintain appropriate glucose levels will improve Outcome: Progressing   Problem: Health Behavior/Discharge Planning: Goal: Ability to identify and utilize available resources and services will improve Outcome: Progressing Goal: Ability to manage health-related needs will improve Outcome: Progressing   Problem: Nutritional: Goal: Maintenance of adequate nutrition will improve Outcome: Progressing Goal: Progress toward achieving an optimal weight will improve Outcome: Progressing

## 2023-10-12 NOTE — ED Provider Notes (Signed)
Discussed with cardiology He recommends de-escalating Levophed, and starting dopamine  Patient is awake and alert at this time. He is now being evaluated by critical care Dr. Verneita Griffes, MD 10/12/23 [phone]

## 2023-10-12 NOTE — H&P (Signed)
NAME:  Christopher [name], MRN:  [id number], DOB:  05/29/1966, LOS: 0 ADMISSION DATE:  10/12/2023 CONSULTATION DATE:  10/12/2023 REFERRING MD:  Bebe Shaggy - EDP CHIEF COMPLAINT:  Hypotension, shock   History of Present Illness:  57 year old man who presented to [facility] ED 10/26 for fatigue and BLE edema, progressive SOB x 2-3 weeks. PMHx significant for HTN, CHF, dilated CM (Echo 05/2019 with EF 20-25% with severe LV dilation, global hypokinesis, no RWMAs; severely reduced RV function with RVSP 60), gout, EtOH use, some tobacco use.  Patient presented to ED after reporting increased fatigue, SOB and BLE edema. Reports SOB x 1 month, progressively worsening over the last few weeks. He has felt more tired at work and called out this past week, which is out of character for him. Seen at PCP's office for SOB and acute onset of BLE edema with instructions to increase torsemide dose (20mg  to 40mg  daily). SOB felt significantly worsened 10/26AM and patient called EMS for evaluation. Denies fever/chills, CP, nausea/vomiting. Endorses SOB/cough, no hemoptysis. Endorses BLE edema. Patient does note that he has previously had low blood pressure requiring evaluation at [facility]; after that admission Coreg was stopped due to hypotension. Recently was told he may need to resume this due to an elevated HR.  On ED arrival, patient was afebrile, HR 95, BP 68/50, RR 9, SpO2 100%. Labs were notable for WBC 5.6, Hgb 11.1, Plt 98. Na 135, K 5.1, CO2 20, BUN/Cr 60/3.37, LFTs WNL with exception of mildly elevated Tbili (1.5). LA 2.0. Ethanol < 10. Trop 16. COVID/Flu/RSV negative. CXR with atelectasis RLL, small R pleural effusion, cardiomegaly without pulmonary edema.  PCCM consulted for ICU admission in the setting of shock (cardiogenic versus hypovolemic).  Pertinent Medical History:   Past Medical History:  Diagnosis Date   Alcohol use    CHF (congestive heart failure) (HCC)    Dilated cardiomyopathy (HCC)    Gout     Hypertension    Significant Hospital Events: Including procedures, antibiotic start and stop dates in addition to other pertinent events   10/26 - Presented to [facility] ED  Interim History / Subjective:  PCCM consulted for ICU admission.  Objective:  Blood pressure (!) 89/72, pulse 99, temperature 98 F (36.7 C), temperature source Oral, resp. rate (!) 21, SpO2 100%.       No intake or output data in the 24 hours ending 10/12/23 0649 There were no vitals filed for this visit.  Physical Examination: General: Acute-on-chronically ill-appearing middle-aged man in NAD. Pleasant and conversant. HEENT: [place]/AT, anicteric sclera, PERRL, moist mucous membranes. Neck: Supple, midline trachea, +JVD. Neuro: Awake, oriented x 4. Responds to verbal stimuli. Following commands consistently. Moves all 4 extremities spontaneously.  CV: RRR, III/VI systolic murmur heard at LLSB. PULM: Breathing even and minimally labored on RA. Lung fields clear in upper fields, faint bibasilar crackles R > L. GI: Soft, nontender, nondistended. Normoactive bowel sounds. Extremities: Bilateral symmetric 2+ pitting LE edema noted. Skin: Warm/dry, no rashes.  Resolved Hospital Problem List:    Assessment & Plan:  Undifferentiated shock, cardiogenic versus hypovolemic, less likely septic - Admit to ICU - Goal MAP > 65 - Gentle fluid resuscitation in the setting of CHF/severe dilated CM with reduced EF, bolus now - Levophed titrated to goal MAP - May benefit from central access for additional information (CVP, Co-ox) to further guide treatment; triple lumen CVC versus Swan - Trend WBC, fever curve, LA - Check PCT, though doubt sepsis driving this  CHF Dilated cardiomyopathy, presume EtOH-related Echo 05/2019 with EF 20-25% with severe LV dilation, global hypokinesis, no RWMAs; severely reduced RV function with RVSP 60. - Cardiology consulted, appreciate recommendations - Repeat Echo - Cardiac monitoring - Hold  GDMT in the setting of hypotension - Hold diuretics for now - May require RHC/Swan for more definitive management - Consider central access (Swan vs. CVC) for CVP/Co-ox to help guide therapy  AKI on CKD stage 3a, concern for cardiorenal syndrome - Trend BMP - Replete electrolytes as indicated - Monitor I&Os, hold diuresis - Avoid nephrotoxic agents as able - Ensure adequate renal perfusion  EtOH abuse - Monitor for signs/symptoms of withdrawal - CIWA monitoring, Ativan PRN - Thiamine/folate - MV when able to tolerate PO  Tobacco abuse - Encourage cessation - Nicotine patch if desired  Gout - Resume allopurinol as appropriate  Best Practice: (right click and "Reselect all SmartList Selections" daily)   Diet/type: NPO for possible intervention, pending Cards eval DVT prophylaxis: SCDs, SQH GI prophylaxis: N/A Lines: N/A Foley:  N/A Code Status:  full code Last date of multidisciplinary goals of care discussion [Pending]  Labs:  CBC: Recent Labs  Lab 10/12/23 0507 10/12/23 0537  WBC 5.6  --   HGB 11.1* 12.6*  HCT 34.7* 37.0*  MCV 107.1*  --   PLT 98*  --    Basic Metabolic Panel: Recent Labs  Lab 10/12/23 0507 10/12/23 0537  NA 135 135  K 5.1 5.2*  CL 102 105  CO2 20*  --   GLUCOSE 135* 125*  BUN 60* 59*  CREATININE 3.37* 3.70*  CALCIUM 8.9  --    GFR: CrCl cannot be calculated (Unknown ideal weight.). Recent Labs  Lab 10/12/23 0507 10/12/23 0536  WBC 5.6  --   LATICACIDVEN  --  2.0*   Liver Function Tests: Recent Labs  Lab 10/12/23 0507  AST 41  ALT 21  ALKPHOS 98  BILITOT 1.5*  PROT 6.8  ALBUMIN 3.6   No results for input(s): "LIPASE", "AMYLASE" in the last 168 hours. No results for input(s): "AMMONIA" in the last 168 hours.  ABG:    Component Value Date/Time   PHART 7.414 02/27/2011 0756   PCO2ART 35.1 02/27/2011 0756   PO2ART 76.0 (L) 02/27/2011 0756   HCO3 25.5 (H) 02/27/2011 0802   TCO2 21 (L) 10/12/2023 0537   ACIDBASEDEF  1.0 02/27/2011 0756   O2SAT 65.0 02/27/2011 0802   Coagulation Profile: No results for input(s): "INR", "PROTIME" in the last 168 hours.  Cardiac Enzymes: No results for input(s): "CKTOTAL", "CKMB", "CKMBINDEX", "TROPONINI" in the last 168 hours.  HbA1C: No results found for: "HGBA1C"  CBG: No results for input(s): "GLUCAP" in the last 168 hours.  Review of Systems:   Review of Systems  Constitutional:  Positive for malaise/fatigue. Negative for chills, fever and weight loss.  HENT:  Negative for congestion, ear pain and sore throat.   Respiratory:  Positive for cough and shortness of breath. Negative for hemoptysis, sputum production and wheezing.   Cardiovascular:  Positive for orthopnea and leg swelling. Negative for chest pain.  Gastrointestinal:  Negative for abdominal pain, diarrhea, nausea and vomiting.  Genitourinary:  Negative for dysuria.  Musculoskeletal:  Negative for myalgias.  Skin:  Negative for itching and rash.  Neurological:  Positive for weakness. Negative for dizziness.  Endo/Heme/Allergies:  Does not bruise/bleed easily.   Past Medical History:  He,  has a past medical history of Alcohol use, CHF (congestive heart failure) (HCC), Dilated  cardiomyopathy (HCC), Gout, and Hypertension.   Surgical History:   Past Surgical History:  Procedure Laterality Date   BIOPSY  08/30/2020   Procedure: BIOPSY;  Surgeon: Jeani Hawking, MD;  Location: [facility] ENDOSCOPY;  Service: Endoscopy;;   ESOPHAGOGASTRODUODENOSCOPY (EGD) WITH PROPOFOL N/A 08/30/2020   Procedure: ESOPHAGOGASTRODUODENOSCOPY (EGD) WITH PROPOFOL;  Surgeon: Jeani Hawking, MD;  Location: [facility] ENDOSCOPY;  Service: Endoscopy;  Laterality: N/A;   Social History:   reports that he quit smoking about 14 years ago. His smoking use included cigarettes. He started smoking about 18 years ago. He has a 1 pack-year smoking history. He has never used smokeless tobacco. He reports current alcohol use. He reports that he does not  currently use drugs.   Family History:  His family history includes Hypertension in his mother.   Allergies: No Known Allergies   Home Medications: Prior to Admission medications   Medication Sig Start Date End Date Taking? Authorizing Provider  allopurinol (ZYLOPRIM) 300 MG tablet Take 300 mg by mouth every morning.  03/16/19   [provider]  allopurinol (ZYLOPRIM) 300 MG tablet TAKE 1 TABLET BY MOUTH ONCE DAILY 03/01/20 03/01/21  Kirby Funk, MD  allopurinol (ZYLOPRIM) 300 MG tablet Take 1 tablet (300 mg total) by mouth daily. 04/04/22     allopurinol (ZYLOPRIM) 300 MG tablet Take 1 tablet (300 mg total) by mouth daily. 04/04/22     allopurinol (ZYLOPRIM) 300 MG tablet Take 1 tablet (300 mg total) by mouth daily. 09/09/23     aspirin EC 81 MG tablet Take 81 mg by mouth every morning.     [provider]  carvedilol (COREG) 25 MG tablet Take 1 tablet (25 mg total) by mouth every morning. do not take until follow up with PCP/cardiology 08/31/20   Tyrone Nine, MD  carvedilol (COREG) 25 MG tablet TAKE 1 TABLET BY MOUTH TWICE DAILY 03/06/21 05/13/22  Kirby Funk, MD  carvedilol (COREG) 25 MG tablet Take 1 tablet (25 mg total) by mouth 2 (two) times daily. 06/27/22     carvedilol (COREG) 25 MG tablet Take 1 tablet (25 mg total) by mouth 2 (two) times daily. 05/15/23     carvedilol (COREG) 25 MG tablet Take 1 tablet (25 mg total) by mouth 2 (two) times daily. 10/10/23     folic acid (FOLVITE) 1 MG tablet Take 1 tablet (1 mg total) by mouth daily. 10/06/21     folic acid (FOLVITE) 1 MG tablet Take 1 tablet (1 mg total) by mouth daily. 11/21/22     pantoprazole (PROTONIX) 40 MG tablet Take 1 tablet (40 mg total) by mouth 2 (two) times daily. 08/31/20   Tyrone Nine, MD  potassium chloride (KLOR-CON M) 10 MEQ tablet Take 1 tablet (10 mEq total) by mouth 2 (two) times daily with food 05/15/23     potassium chloride (KLOR-CON) 10 MEQ tablet Take 1 tablet (10 mEq total) by mouth every  morning. do not take until follow up with PCP/cardiology 08/31/20   Tyrone Nine, MD  potassium chloride (KLOR-CON M) 10 MEQ tablet TAKE 1 TABLET BY MOUTH TWICE DAILY WITH FOOD 02/07/21 02/07/22  Kirby Funk, MD  sacubitril-valsartan (ENTRESTO) 97-103 MG Take 1 tablet by mouth every morning. do not take until follow up with PCP/cardiology 08/31/20   Tyrone Nine, MD  sacubitril-valsartan (ENTRESTO) 97-103 MG Take 1 tablet by mouth 2 (two) times daily. 01/11/22     sacubitril-valsartan (ENTRESTO) 97-103 MG Take 1 tablet by mouth 2 (two) times daily.  08/21/23     spironolactone (ALDACTONE) 25 MG tablet Take 1 tablet (25 mg total) by mouth every morning. do not take until follow up with PCP/cardiology 08/31/20   Tyrone Nine, MD  spironolactone (ALDACTONE) 25 MG tablet TAKE 1 TABLET BY MOUTH DAILY 90 10/27/20 10/27/21  Kirby Funk, MD  spironolactone (ALDACTONE) 25 MG tablet Take 1 tablet (25 mg total) by mouth daily. 03/06/23     spironolactone (ALDACTONE) 25 MG tablet Take 1 tablet (25 mg total) by mouth daily. 03/06/23     spironolactone (ALDACTONE) 25 MG tablet Take 1 tablet (25 mg total) by mouth daily. 05/15/23     spironolactone (ALDACTONE) 25 MG tablet Take 1 tablet (25 mg total) by mouth daily. 09/09/23     tamsulosin (FLOMAX) 0.4 MG CAPS capsule Take 1 capsule (0.4 mg total) by mouth daily. 09/01/20   Tyrone Nine, MD  tamsulosin (FLOMAX) 0.4 MG CAPS capsule Take 1 capsule (0.4 mg total) by mouth daily. 09/04/23     torsemide (DEMADEX) 20 MG tablet Take 1 tablet (20 mg total) by mouth daily. do not take until follow up with PCP/cardiology 08/31/20   Tyrone Nine, MD  torsemide (DEMADEX) 20 MG tablet TAKE 1 TABLET BY MOUTH ONCE DAILY 11/22/20 11/22/21  Kirby Funk, MD  torsemide (DEMADEX) 20 MG tablet Take 1 tablet (20 mg total) by mouth daily. 03/06/23     torsemide (DEMADEX) 20 MG tablet Take 1 tablet (20 mg total) by mouth daily. 03/06/23     torsemide (DEMADEX) 20 MG tablet Take 1 tablet (20 mg  total) by mouth daily. 05/15/23     torsemide (DEMADEX) 20 MG tablet Take 2 tablets (40 mg total) by mouth daily. 10/10/23      Critical care time:   The patient is critically ill with multiple organ system failure and requires high complexity decision making for assessment and support, frequent evaluation and titration of therapies, advanced monitoring, review of radiographic studies and interpretation of complex data.   Critical Care Time devoted to patient care services, exclusive of separately billable procedures, described in this note is 35 minutes.  Tim Lair, PA-C [facility] 10/12/23 6:49 AM  Please see [email or web address] for pager details.  From 7A-7P if no response, please call [phone] After hours, please call ELink [phone]

## 2023-10-12 NOTE — Consult Note (Signed)
Advanced Heart Failure Team Consult Note   Primary Physician: Kirby Funk, MD (Inactive) PCP-Cardiologist:  None  Reason for Consultation: Cardiogenic shock  HPI:    57 year old male with known history of CKD stage IIIa, pulmonary hypertension, and alcohol related dilated cardiomyopathy with chronic combined diastolic and systolic congestive heart failure who presents in cardiogenic shock.  Patient has a known history of cardiomyopathy, last ejection fraction was around 20% in 2020.  He has been lost to follow-up over the past few years, but reports that he has been doing fairly well.  He works at [employer] and has had very little limitations in his work and activities of daily living.  However, he reports that for the past month he has been having worsening shortness of breath, fatigue without improvement.  He periodically takes his medication, last dose of torsemide was 2 days ago.  He reports that he still drinks alcohol regularly, drinking beer and at least a pint of vodka every day.  This has been ongoing for many years.  Previous polysubstance abuse, but reports it was only occasional crack cocaine about 25 years ago.  He denies any chest pain, fever, chills, vomiting, headache, syncope.    Home Medications Prior to Admission medications   Medication Sig Start Date End Date Taking? Authorizing Provider  allopurinol (ZYLOPRIM) 300 MG tablet Take 1 tablet (300 mg total) by mouth daily. 09/09/23  Yes   aspirin EC 81 MG tablet Take 81 mg by mouth every morning.    Yes [provider]  potassium chloride (KLOR-CON M) 10 MEQ tablet TAKE 1 TABLET BY MOUTH TWICE DAILY WITH FOOD 02/07/21 10/11/24 Yes Kirby Funk, MD  potassium chloride (KLOR-CON M) 10 MEQ tablet Take 1 tablet (10 mEq total) by mouth 2 (two) times daily with food 05/15/23  Yes   potassium chloride (KLOR-CON) 10 MEQ tablet Take 1 tablet (10 mEq total) by mouth every morning. do not take until follow up with PCP/cardiology  08/31/20  Yes Tyrone Nine, MD  sacubitril-valsartan (ENTRESTO) 97-103 MG Take 1 tablet by mouth 2 (two) times daily. 08/21/23  Yes   tamsulosin (FLOMAX) 0.4 MG CAPS capsule Take 1 capsule (0.4 mg total) by mouth daily. 09/04/23  Yes   torsemide (DEMADEX) 20 MG tablet Take 1 tablet (20 mg total) by mouth daily. 03/06/23  Yes   carvedilol (COREG) 25 MG tablet Take 1 tablet (25 mg total) by mouth 2 (two) times daily. 05/15/23     carvedilol (COREG) 25 MG tablet Take 1 tablet (25 mg total) by mouth 2 (two) times daily. Patient not taking: Reported on 10/12/2023 10/10/23     folic acid (FOLVITE) 1 MG tablet Take 1 tablet (1 mg total) by mouth daily. Patient not taking: Reported on 10/12/2023 11/21/22     spironolactone (ALDACTONE) 25 MG tablet Take 1 tablet (25 mg total) by mouth daily. 03/06/23       Past Medical History: PMH, PSH, family history, social history, allergies reviewed in Epic  Objective:    Vital Signs:   Temp:  [98 F (36.7 C)-98.6 F (37 C)] 98.6 F (37 C) (10/26 0810) Pulse Rate:  [92-253] 253 (10/26 1030) Resp:  [9-28] 22 (10/26 1030) BP: (67-91)/(50-72) 83/68 (10/26 1030) SpO2:  [94 %-100 %] 98 % (10/26 1030) Weight:  [107.8 kg] 107.8 kg (10/26 0700) Last BM Date :  (PTA)  Weight change: Filed Weights   10/12/23 0700  Weight: 107.8 kg    Intake/Output:  Intake/Output Summary (Last 24 hours) at 10/12/2023 1149 Last data filed at 10/12/2023 0800 Gross per 24 hour  Intake 598.8 ml  Output --  Net 598.8 ml      Physical Exam    General: Ill appearing. No resp difficulty HEENT: normal Cor: Tachycardic, systolic murmur, JVP significantly elevated Lungs: Mildly increased work of breathing Abdomen: soft, nontender, mildly distended. Extremities: no cyanosis, clubbing, rash, edema Neuro: alert & orientedx3, cranial nerves grossly intact. moves all 4 extremities w/o difficulty. Affect pleasant   Telemetry   Sinus tachycardia  EKG    Sinus rhythm, low  voltage  Patient Profile   57 year old male with known history of CKD stage IIIa, pulmonary hypertension, and alcohol related dilated cardiomyopathy with chronic combined diastolic and systolic congestive heart failure who presents in cardiogenic shock.   Assessment/Plan   Cardiogenic shock: Patient presented in cardiogenic shock, hypotensive with mildly elevated lactate.  He was placed on norepinephrine and subsequently dobutamine with improvement in his lactate.  His Coox is 35% which gives a cardiac index of 1.5 by assumed Fick.  Given ongoing heavy alcohol use and previous lost to follow-up would potentially consider MCS but only as a bridge to recovery. - Dobutamine at 5, continue norepi for BP support -Increase Lasix drip with bolus to 15 -Holding GDMT with hypotensive cardiogenic shock -Trend Coox -Poor advanced therapies candidate -Repeat echocardiogram reasonable  AKI on CKD: Baseline likely around 1.6-1.9 based on outside labs, elevated to 3 on arrival.  Secondary to cardiorenal syndrome. -IV diuresis as above  Alcohol abuse: Drinks at least a pint of liquor every day, has not gone without alcohol in many years.  Denies withdrawal symptoms in the past. -Need CIWA protocol -Thiamine/folate  Length of Stay: 0  Romie Minus, MD  10/12/2023, 11:49 AM  Advanced Heart Failure Team Pager [phone] (M-F; 7a - 5p)  Please contact [facility] for night-coverage after hours (4p -7a ) and weekends on [email or web address]  CRITICAL CARE Performed by: Romie Minus   Total critical care time: 60 minutes  Critical care time was exclusive of separately billable procedures and treating other patients.  Critical care was necessary to treat or prevent imminent or life-threatening deterioration.  Critical care was time spent personally by me on the following activities: development of treatment plan with patient and/or surrogate as well as nursing, discussions with consultants,  evaluation of patient's response to treatment, examination of patient, obtaining history from patient or surrogate, ordering and performing treatments and interventions, ordering and review of laboratory studies, ordering and review of radiographic studies, pulse oximetry and re-evaluation of patient's condition.

## 2023-10-12 NOTE — ED Triage Notes (Signed)
Pt with hx of CHF comes in feeling fatigue and has edema bilaterally. He also states he drinks heavily and daily. He comes in with hypotension as well.

## 2023-10-12 NOTE — Progress Notes (Signed)
Peripherally Inserted Central Catheter Placement  The IV Nurse has discussed with the patient and/or persons authorized to consent for the patient, the purpose of this procedure and the potential benefits and risks involved with this procedure.  The benefits include less needle sticks, lab draws from the catheter, and the patient may be discharged home with the catheter. Risks include, but not limited to, infection, bleeding, blood clot (thrombus formation), and puncture of an artery; nerve damage and irregular heartbeat and possibility to perform a PICC exchange if needed/ordered by physician.  Alternatives to this procedure were also discussed.  Bard Power PICC patient education guide, fact sheet on infection prevention and patient information card has been provided to patient /or left at bedside.    PICC Placement Documentation  PICC Triple Lumen 10/12/23 Right Brachial 38 cm (Active)  Indication for Insertion or Continuance of Line Vasoactive infusions 10/12/23 0940  Exposed Catheter (cm) 0 cm 10/12/23 0940  Site Assessment Clean, Dry, Intact 10/12/23 0940  Lumen #1 Status Saline locked;Blood return noted 10/12/23 0940  Lumen #2 Status Saline locked;Blood return noted 10/12/23 0940  Lumen #3 Status Saline locked;Blood return noted 10/12/23 0940  Dressing Type Transparent;Securing device 10/12/23 0940  Dressing Status Antimicrobial disc in place;Clean, Dry, Intact 10/12/23 0940  Line Care Connections checked and tightened 10/12/23 0940  Line Adjustment (NICU/IV Team Only) No 10/12/23 0940  Dressing Intervention New dressing;Adhesive placed at insertion site (IV team only) 10/12/23 0940  Dressing Change Due 10/19/23 10/12/23 0940       Burnard Bunting Chenice 10/12/2023, 9:41 AM

## 2023-10-12 NOTE — Progress Notes (Signed)
There was a consult for [facility] due to vasopressors. MD changed order for PICC line for checking cardiac functions according patient's RN. HS [employer]

## 2023-10-12 NOTE — Consult Note (Signed)
Cardiology Consultation   Patient ID: KAIRAN KHIM MRN: [id number]; DOB: 03-11-1966  Admit date: 10/12/2023 Date of Consult: 10/12/2023  PCP:  Kirby Funk, MD (Inactive)    HeartCare Providers Cardiologist:  None        Patient Profile:   Christopher [name] is a 57 y.o. male with a history of heart failure with reduced ejection fraction thought secondary to alcohol abuse, hypertension, hypercholesteremia, severe pulmonary hypertension, and polysubstance use who is being seen 10/12/2023 for the evaluation of shock at the request of Dr. Bebe Shaggy.  History of Present Illness:   Christopher [name] reports for the past 2-3 weeks he has not been feeling well.  He has noticed that he is more short of breath when he exerts himself such as walking up a flight of stairs and notices dizziness with exertion as well.  During this time, he feels that he has gained weight and now has ongoing swelling in his legs.  He reports there has been multiple times for which he felt like he was going to faint denies fainting.  He denies any other symptoms such as chest pain or palpitations.  He states that he recently restarted his carvedilol and has had been taking his torsemide as prescribed.     Of note, patient was seen by his primary care provider two days ago for which he reported 30 lb weight increase since July of 2024.  His torsemide was increased to 40 mg daily.  Creatinine at his last clinic visit was 2.12 mg/dL.  Furthermore, patient was hospitalized at an outside hospital for multiple syncope events for which acute kidney injury was noted.  It was thought that his symptoms were from dehydration for which he received IV fluids.  His carvedilol was held at that time but patient was continued on Entresto.  Patient previously followed with Dr. Jacinto Halim for his heart failure management but was lost to follow up.    Past Medical History:  Diagnosis Date   CHF (congestive heart failure) (HCC)    Gout     Hypertension     Past Surgical History:  Procedure Laterality Date   BIOPSY  08/30/2020   Procedure: BIOPSY;  Surgeon: Jeani Hawking, MD;  Location: [facility];  Service: Endoscopy;;   ESOPHAGOGASTRODUODENOSCOPY (EGD) WITH PROPOFOL N/A 08/30/2020   Procedure: ESOPHAGOGASTRODUODENOSCOPY (EGD) WITH PROPOFOL;  Surgeon: Jeani Hawking, MD;  Location: [facility];  Service: Endoscopy;  Laterality: N/A;     Home Medications:  Prior to Admission medications   Medication Sig Start Date End Date Taking? Authorizing Provider  allopurinol (ZYLOPRIM) 300 MG tablet Take 1 tablet (300 mg total) by mouth daily. 09/09/23  Yes   aspirin EC 81 MG tablet Take 81 mg by mouth every morning.    Yes [provider]  potassium chloride (KLOR-CON M) 10 MEQ tablet TAKE 1 TABLET BY MOUTH TWICE DAILY WITH FOOD 02/07/21 10/11/24 Yes Kirby Funk, MD  potassium chloride (KLOR-CON M) 10 MEQ tablet Take 1 tablet (10 mEq total) by mouth 2 (two) times daily with food 05/15/23  Yes   potassium chloride (KLOR-CON) 10 MEQ tablet Take 1 tablet (10 mEq total) by mouth every morning. do not take until follow up with PCP/cardiology 08/31/20  Yes Tyrone Nine, MD  sacubitril-valsartan (ENTRESTO) 97-103 MG Take 1 tablet by mouth 2 (two) times daily. 08/21/23  Yes   spironolactone (ALDACTONE) 25 MG tablet Take 1 tablet (25 mg total) by mouth daily. 09/09/23  Yes   tamsulosin (  FLOMAX) 0.4 MG CAPS capsule Take 1 capsule (0.4 mg total) by mouth daily. 09/04/23  Yes   torsemide (DEMADEX) 20 MG tablet Take 2 tablets (40 mg total) by mouth daily. 10/10/23  Yes   carvedilol (COREG) 25 MG tablet Take 1 tablet (25 mg total) by mouth 2 (two) times daily. 05/15/23     carvedilol (COREG) 25 MG tablet Take 1 tablet (25 mg total) by mouth 2 (two) times daily. Patient not taking: Reported on 10/12/2023 10/10/23     folic acid (FOLVITE) 1 MG tablet Take 1 tablet (1 mg total) by mouth daily. Patient not taking: Reported on 10/12/2023 11/21/22      spironolactone (ALDACTONE) 25 MG tablet Take 1 tablet (25 mg total) by mouth daily. 03/06/23     spironolactone (ALDACTONE) 25 MG tablet Take 1 tablet (25 mg total) by mouth daily. 03/06/23     spironolactone (ALDACTONE) 25 MG tablet Take 1 tablet (25 mg total) by mouth daily. 05/15/23     torsemide (DEMADEX) 20 MG tablet Take 1 tablet (20 mg total) by mouth daily. 03/06/23     torsemide (DEMADEX) 20 MG tablet Take 1 tablet (20 mg total) by mouth daily. 05/15/23       Inpatient Medications: Scheduled Meds:  Continuous Infusions:  sodium chloride 250 mL (10/12/23 0549)   norepinephrine (LEVOPHED) Adult infusion 3 mcg/min (10/12/23 0551)   PRN Meds:   Allergies:   No Known Allergies  Social History:   Social History   Socioeconomic History   Marital status: Single    Spouse name: Not on file   Number of children: 1   Years of education: Not on file   Highest education level: Not on file  Occupational History   Not on file  Tobacco Use   Smoking status: Former    Current packs/day: 0.00    Average packs/day: 0.3 packs/day for 4.0 years (1.0 ttl pk-yrs)    Types: Cigarettes    Start date: 05/05/2005    Quit date: 05/05/2009    Years since quitting: 14.4   Smokeless tobacco: Never  Vaping Use   Vaping status: Never Used  Substance and Sexual Activity   Alcohol use: Yes    Comment: beer qod   Drug use: Not Currently   Sexual activity: Not on file  Other Topics Concern   Not on file  Social History Narrative   Not on file   Social Determinants of Health   Financial Resource Strain: Low Risk  (05/28/2023)   Received [facility]   Overall [employer] Strain (CARDIA)    Difficulty of Paying Living Expenses: Not hard at all  Food Insecurity: No Food Insecurity (05/28/2023)   Received from [facility] [place], [facility] Vital Sign    Worried About Programme researcher, broadcasting/film/video in the Last  Year: Never true    Ran Out of Food in the Last Year: Never true  Transportation Needs: No Transportation Needs (05/28/2023)   Received from [facility] [place], [facility] [employer] [facility] - Administrator, Civil Service (Medical): No    Lack of Transportation (Non-Medical): No  Physical Activity: Not on file  Stress: Not on file  Social Connections: Not on file  Intimate Partner Violence: Not At Risk (05/28/2023)   Received from [facility] [place], [facility], Afraid, Rape, and Kick questionnaire  Fear of Current or Ex-Partner: No    Emotionally Abused: No    Physically Abused: No    Sexually Abused: No    Family History:    Family History  Problem Relation Age of Onset   Hypertension Mother      ROS:  Please see the history of present illness.   All other ROS reviewed and negative.     Physical Exam/Data:   Vitals:   10/12/23 0500 10/12/23 0515 10/12/23 0525 10/12/23 0545  BP: (!) 72/53 (!) 78/66 (!) 73/63 (!) 68/50  Pulse: 95 92 98 95  Resp: 17 18 (!) 21 (!) 9  Temp:      TempSrc:      SpO2: 100% 99% 100% 100%   No intake or output data in the 24 hours ending 10/12/23 0554    08/31/2020    5:00 AM 08/30/2020   12:01 PM 08/29/2020    1:35 PM  Last 3 Weights  Weight (lbs) 263 lb 7.2 oz 286 lb 286 lb  Weight (kg) 119.5 kg 129.729 kg 129.729 kg     There is no height or weight on file to calculate BMI.  General:  Well nourished, well developed, in no acute distress HEENT: normal Neck: > 10cm JVD Vascular: No carotid bruits; Distal pulses 2+ bilaterally Cardiac:  normal S1, S2; RRR; 2/6 crescendo murmur LLSB Lungs:  clear to auscultation bilaterally, no wheezing, rhonchi or rales  Abd: soft, nontender, no hepatomegaly  Ext: 2+ bilateral lower extremity edema up to knees Musculoskeletal:  No deformities, BUE and BLE strength normal and equal Skin: cool to touch, dry  Neuro:  CNs 2-12  intact, no focal abnormalities noted Psych:  Normal affect   EKG:  The EKG was personally reviewed and demonstrates:  10/12/23 (0506 hrs):  NSR; low-voltage QRS; prolonged QTc Telemetry:  Telemetry was personally reviewed and demonstrates:  Sinus rhythm  Relevant CV Studies: # Echocardiogram 05/29/23 performed at [facility]: Summary   Left Ventricle: The left ventricle is severely dilated. The ejection fraction is moderate to severely decreased. The estimated ejection fraction is 30-35%. There is mild concentric LV hypertrophy visualized. Cannot exclude base to mid inferior extending posterior and to mid to apical lateral hypokinesis. Grade I (diastolic dysfunction): diastolic filling pattern indicates impaired relaxation.   Right Ventricle: The right ventricle cavity size is normal. Normal systolic function is visualized.   Aortic Valve: Mild to moderate aortic regurgitation is visualized.   Mitral Valve: Moderate mitral regurgitation is visualized.   Tricuspid Valve: Trace amount of tricuspid regurgitation is visualized. There is no evidence of pulmonary hypertension. There is evidence that pulmonary hypertension may be underestimated. The right ventricular systolic pressure is estimated to be 5-10 mmHg.   Aorta: There is no dilatation of the aortic root. There is no dilatation of the ascending aorta.   IVC / Hepatic Veins: The inferior vena cava is normal. Respiratory change in dimension is < 50 %.   # Echocardiogram 06/09/19: IMPRESSIONS   1. The left ventricle has severely reduced systolic function, with an  ejection fraction of 20-25% 15-20%. The cavity size was severely dilated.  There is mildly increased left ventricular wall thickness. Left  ventricular diastolic Doppler parameters are  consistent with restrictive filling. Elevated left atrial and left  ventricular end-diastolic pressures Left ventrical global hypokinesis  without regional wall motion abnormalities.    2. The right ventricle has severely reduced systolic function. The cavity  was severely enlarged. There is no  increase in right ventricular wall  thickness. Right ventricular systolic pressure is moderately elevated with  an estimated pressure of 60.3  mmHg.   3. Left atrial size was severely dilated.   4. Right atrial size was severely dilated.   5. Severe mitral annular dilatation.   6. The mitral valve is abnormal. Mild thickening of the mitral valve  leaflet. Mitral valve regurgitation is moderate to severe by color flow  Doppler.   7. The tricuspid valve is abnormal. Tricuspid valve regurgitation is  moderate.   8. Moderate annular dilatation.   9. The aortic root is normal in size and structure.   # Right/left cardiac catheterization with coronary angiography 02/28/11: HEMODYNAMIC DATA:  A.  Right heart catheterization: 1. RA pressure 28/27, mean 25 mmHg. 2. RV pressure 71/20 with end-diastolic pressure of 26 mmHg. 3. PA pressure 75/42 with a mean of 50 mmHg, pulmonary capillary wedge     pressure 48/58 with a mean of 46 mmHg. 4. PA saturation 65% and aortic saturation was 95%. 5. Cardiac output by Fick was 5.44 with a cardiac index of 2.2.     a.     Left heart catheterization:  Left ventricular pressure was      113/28 with an end-diastolic pressure of 42 mmHg.  Aortic pressure      was 111/92 with a mean of 191 msec.  There was no pressure      gradient across the aortic valve. ANGIOGRAPHIC DATA: 1. Left ventricle:  Left ventricular systolic function was markedly     depressed with ejection fraction of 15% to 20%.  The left ventricle     was markedly dilated. 2. Right coronary artery:  Right coronary artery was a dominant     vessel.  It is smooth and has got mild luminal irregularity. 3. Left main coronary artery:  Left main coronary artery was a large-     caliber vessel, smooth and normal. 4. Circumflex:  Circumflex was a very large-caliber vessel measuring     at  least 6 to 6.5 mm in diameter.  Gives origin to large obtuse     marginals.  There was mild luminal irregularity. 5. LAD:  LAD is a large-caliber vessel giving origin to a large     diagonal-1.  It has got mild luminal irregularity.     Laboratory Data:  High Sensitivity Troponin:  No results for input(s): "TROPONINIHS" in the last 720 hours.   Chemistry Recent Labs  Lab 10/12/23 0537  NA 135  K 5.2*  CL 105  GLUCOSE 125*  BUN 59*  CREATININE 3.70*    No results for input(s): "PROT", "ALBUMIN", "AST", "ALT", "ALKPHOS", "BILITOT" in the last 168 hours. Lipids No results for input(s): "CHOL", "TRIG", "HDL", "LABVLDL", "LDLCALC", "CHOLHDL" in the last 168 hours.  Hematology Recent Labs  Lab 10/12/23 0537  HGB 12.6*  HCT 37.0*   Thyroid No results for input(s): "TSH", "FREET4" in the last 168 hours.  BNPNo results for input(s): "BNP", "PROBNP" in the last 168 hours.  DDimer No results for input(s): "DDIMER" in the last 168 hours.   Radiology/Studies:  No results found.   Assessment and Plan:   Hypotension/Shock: Patient presented with hypotension in the setting of laboratories consistent with shock.  Patient is both wet and cool to touch on examination, concerning for cardiogenic shock.  Currently receiving norepinephrine.  Noted acute kidney injury and lactic acid is elevated but normal liver function at this time.  Systolic blood pressure  initially <80 mmHg.  Critical care to admit. --Please admit to CICU for ongoing management. --Hold home carvedilol, sacubitril/valsartan, and spironolactone for now.   --Please start dopamine 5 mcg/kg/min initially.    --Patient will need central line versus SWAN placement for further evaluation and monitoring. --Patient will need arterial line placement.   --Trend lactic acid. --Start furosemide drip.  --Repeat echocardioram. --Day team to discuss case further with advanced heart failure team.  Cardiomyopathy: Chronic  non-ischemic cardiomyopathy thought secondary to alcohol abuse with prior work-up being negative.  Associated with WHO type II pulmonary hypertension.  Patient was lost to follow-up with cardiology for years.  Home guideline directed medical therapies included: spironolactone, carvedilol, and sacubitril/valsartan.  Patient was not taking a SGLT2 inhibitor.  Currently in likely cardiogenic shock. --Management as above.    Acute on chronic kidney injury: Likely secondary to  cardiorenal syndrome and cardiogenic shock.  Creatinine is worsening.   --Cardiogenic shock management as above. --We will start furosemide drip.    Risk Assessment/Risk Scores:        New York Heart Association (NYHA) Functional Class NYHA Class IV        For questions or updates, please contact  [facility] Please consult [email or web address] for contact info under    Signed, Judie Grieve, MD  10/12/2023 5:54 AM

## 2023-10-12 NOTE — Progress Notes (Signed)
PCCM:  COOX 35 Start dobutamine at 5 Continue diuresis   Josephine Igo, DO [facility] 10/12/2023 11:23 AM

## 2023-10-13 ENCOUNTER — Other Ambulatory Visit: Payer: Self-pay

## 2023-10-13 ENCOUNTER — Inpatient Hospital Stay (HOSPITAL_COMMUNITY): Payer: Commercial Managed Care - PPO

## 2023-10-13 DIAGNOSIS — I509 Heart failure, unspecified: Secondary | ICD-10-CM | POA: Diagnosis not present

## 2023-10-13 DIAGNOSIS — R570 Cardiogenic shock: Secondary | ICD-10-CM | POA: Diagnosis not present

## 2023-10-13 LAB — BASIC METABOLIC PANEL
Anion gap: 13 (ref 5–15)
Anion gap: 11 (ref 5–15)
BUN: 69 mg/dL — ABNORMAL HIGH (ref 6–20)
BUN: 69 mg/dL — ABNORMAL HIGH (ref 6–20)
CO2: 20 mmol/L — ABNORMAL LOW (ref 22–32)
CO2: 23 mmol/L (ref 22–32)
Calcium: 8.4 mg/dL — ABNORMAL LOW (ref 8.9–10.3)
Calcium: 8.8 mg/dL — ABNORMAL LOW (ref 8.9–10.3)
Chloride: 98 mmol/L (ref 98–111)
Chloride: 98 mmol/L (ref 98–111)
Creatinine, Ser: 3.39 mg/dL — ABNORMAL HIGH (ref 0.61–1.24)
Creatinine, Ser: 3.35 mg/dL — ABNORMAL HIGH (ref 0.61–1.24)
GFR, Estimated: 20 mL/min — ABNORMAL LOW (ref >60)
GFR, Estimated: 20 mL/min — ABNORMAL LOW (ref >60)
Glucose, Bld: 173 mg/dL — ABNORMAL HIGH (ref 70–99)
Glucose, Bld: 165 mg/dL — ABNORMAL HIGH (ref 70–99)
Potassium: 4.2 mmol/L (ref 3.5–5.1)
Potassium: 4.2 mmol/L (ref 3.5–5.1)
Sodium: 131 mmol/L — ABNORMAL LOW (ref 135–145)
Sodium: 132 mmol/L — ABNORMAL LOW (ref 135–145)

## 2023-10-13 LAB — CBC
HCT: 28.4 % — ABNORMAL LOW (ref 39.0–52.0)
Hemoglobin: 9.3 g/dL — ABNORMAL LOW (ref 13.0–17.0)
MCH: 33.7 pg (ref 26.0–34.0)
MCHC: 32.7 g/dL (ref 30.0–36.0)
MCV: 102.9 fL — ABNORMAL HIGH (ref 80.0–100.0)
Platelets: 82 10*3/uL — ABNORMAL LOW (ref 150–400)
RBC: 2.76 MIL/uL — ABNORMAL LOW (ref 4.22–5.81)
RDW: 13.8 % (ref 11.5–15.5)
WBC: 6.2 10*3/uL (ref 4.0–10.5)
nRBC: 0 % (ref 0.0–0.2)

## 2023-10-13 LAB — GLUCOSE, CAPILLARY
Glucose-Capillary: 101 mg/dL — ABNORMAL HIGH (ref 70–99)
Glucose-Capillary: 106 mg/dL — ABNORMAL HIGH (ref 70–99)
Glucose-Capillary: 120 mg/dL — ABNORMAL HIGH (ref 70–99)
Glucose-Capillary: 138 mg/dL — ABNORMAL HIGH (ref 70–99)
Glucose-Capillary: 90 mg/dL (ref 70–99)
Glucose-Capillary: 97 mg/dL (ref 70–99)

## 2023-10-13 LAB — ECHOCARDIOGRAM COMPLETE
Area-P 1/2: 3.83 cm2
Calc EF: 23.1 %
Height: 70.25 in
MV M vel: 3.98 m/s
MV Peak grad: 63.4 mm[Hg]
MV VTI: 2.32 cm2
Radius: 0.8 cm
S' Lateral: 6.9 cm
Single Plane A2C EF: 21.7 %
Single Plane A4C EF: 26.2 %
Weight: 3801.6 [oz_av]

## 2023-10-13 LAB — PHOSPHORUS: Phosphorus: 5.8 mg/dL — ABNORMAL HIGH (ref 2.5–4.6)

## 2023-10-13 LAB — COOXEMETRY PANEL
Carboxyhemoglobin: 1.8 % — ABNORMAL HIGH (ref 0.5–1.5)
Methemoglobin: <0.7 % (ref 0.0–1.5)
O2 Saturation: 66.2 %
Total hemoglobin: 9.7 g/dL — ABNORMAL LOW (ref 12.0–16.0)

## 2023-10-13 LAB — HEMOGLOBIN A1C
Hgb A1c MFr Bld: 4.4 % — ABNORMAL LOW (ref 4.8–5.6)
Mean Plasma Glucose: 79.58 mg/dL

## 2023-10-13 LAB — MAGNESIUM: Magnesium: 1.9 mg/dL (ref 1.7–2.4)

## 2023-10-13 LAB — HIV ANTIBODY (ROUTINE TESTING W REFLEX): HIV Screen 4th Generation wRfx: NONREACTIVE

## 2023-10-13 MED ORDER — NOREPINEPHRINE 4 MG/250ML-% IV SOLN
0.0000 ug/min | INTRAVENOUS | Status: DC
Start: 2023-10-13 — End: 2023-10-15
  Administered 2023-10-13 – 2023-10-14 (×2): 2 ug/min via INTRAVENOUS
  Filled 2023-10-13 (×2): qty 250

## 2023-10-13 NOTE — Progress Notes (Signed)
The dressing for the PICC was ripped and the PICC was no longer in the securement device. The line had come out 2-3 cm. The PICC was tapped down where it was, IV medication paused and IV team order placed STAT.

## 2023-10-13 NOTE — Progress Notes (Signed)
Echocardiogram 2D Echocardiogram has been performed.  Warren Lacy Cendy Oconnor RDCS 10/13/2023, 8:28 AM

## 2023-10-13 NOTE — Progress Notes (Signed)
Peripherally Inserted Central Catheter Placement  The IV Nurse has discussed with the patient and/or persons authorized to consent for the patient, the purpose of this procedure and the potential benefits and risks involved with this procedure.  The benefits include less needle sticks, lab draws from the catheter, and the patient may be discharged home with the catheter. Risks include, but not limited to, infection, bleeding, blood clot (thrombus formation), and puncture of an artery; nerve damage and irregular heartbeat and possibility to perform a PICC exchange if needed/ordered by physician.  Alternatives to this procedure were also discussed.  Bard Power PICC patient education guide, fact sheet on infection prevention and patient information card has been provided to patient /or left at bedside.    PICC Placement Documentation  PICC Triple Lumen 10/13/23 Right Brachial 40 cm 0 cm (Active)  Indication for Insertion or Continuance of Line Vasoactive infusions 10/13/23 1959  Exposed Catheter (cm) 0 cm 10/13/23 1959  Site Assessment Clean, Dry, Intact 10/13/23 1959  Lumen #1 Status Flushed;Saline locked;Blood return noted 10/13/23 1959  Lumen #2 Status Flushed;Saline locked;Blood return noted 10/13/23 1959  Lumen #3 Status Flushed;Saline locked;Blood return noted 10/13/23 1959  Dressing Type Transparent;Securing device 10/13/23 1959  Dressing Status Antimicrobial disc in place;Clean, Dry, Intact 10/13/23 1959  Line Care Connections checked and tightened 10/13/23 1959  Line Adjustment (NICU/IV Team Only) No 10/13/23 1959  Dressing Intervention New dressing 10/13/23 1959  Dressing Change Due 10/20/23 10/13/23 1959       Christopher [name] 10/13/2023, 8:00 PM

## 2023-10-13 NOTE — Progress Notes (Addendum)
Advanced Heart Failure Rounding Note  PCP-Cardiologist: None   Subjective:     Patient feels better this morning, starting to urinate. Discussed plan of care with aunt at bedside.   Objective:   Weight Range: 107.8 kg Body mass index is 33.85 kg/m.   Vital Signs:   Temp:  [97.8 F (36.6 C)-98.3 F (36.8 C)] 98.3 F (36.8 C) (10/27 0815) Pulse Rate:  [87-142] 96 (10/27 0800) Resp:  [16-26] 20 (10/27 0800) BP: (77-106)/(55-86) 86/69 (10/27 0800) SpO2:  [97 %-100 %] 98 % (10/27 0800) Last BM Date : 10/13/23  Weight change: Filed Weights   10/12/23 0700  Weight: 107.8 kg    Intake/Output:   Intake/Output Summary (Last 24 hours) at 10/13/2023 1049 Last data filed at 10/13/2023 0800 Gross per 24 hour  Intake 1363.84 ml  Output 1850 ml  Net -486.16 ml      Physical Exam    General: Ill appearing. No resp difficulty HEENT: normal Cor: Tachycardic, systolic murmur, JVP significantly elevated Lungs: Mildly increased work of breathing Abdomen: soft, nontender, mildly distended. Extremities: no cyanosis, clubbing, rash, edema Neuro: alert & orientedx3, cranial nerves grossly intact. moves all 4 extremities w/o difficulty. Affect pleasant    Telemetry   Sinus 90-100s.  Patient Profile   57 year old male with known history of CKD stage IIIa, pulmonary hypertension, and alcohol related dilated cardiomyopathy with chronic combined diastolic and systolic congestive heart failure who presents in cardiogenic shock.   Assessment/Plan     Cardiogenic shock: Patient presented in cardiogenic shock, hypotensive with mildly elevated lactate.  He was placed on norepinephrine and subsequently dobutamine with improvement in his lactate.  His Coox is 35% which gives a cardiac index of 1.5 by assumed Fick.  Given ongoing heavy alcohol use, potential cirrhosis, and previous lost to follow-up would potentially consider MCS but only as a bridge to recovery, likely does not  have any durable options. - Dobutamine at 5, continue norepi for BP support -Continue lasix gtt at 15 -Holding GDMT with hypotensive cardiogenic shock -Trend Coox, improving with dobutamine -Poor advanced therapies candidate -Repeat echocardiogram 10/27   AKI on CKD: Baseline likely around 1.6-1.9 based on outside labs, elevated to 3 on arrival.  Likely secondary to cardiorenal syndrome. -IV diuresis as above   Alcohol abuse: Drinks at least a pint of liquor every day, has not gone without alcohol in many years.  Denies withdrawal symptoms in the past. -Need CIWA protocol -Thiamine/folate  ?Cirrhosis: Based on soft BP, years of heavy alcohol use, and evidence of synthetic dysfunction (platelets, elevated bilirubin) is likely a possibility. Will add on PT-INR to tomorrows labs, and can consider abdominal imaging when more stable. - MELD labs ordered - Abdominal imaging when more stable - If taking for a RHC, would obtain hepatic wedge pressure gradient   Length of Stay: 1  Christopher Minus, MD  10/13/2023, 10:49 AM  Advanced Heart Failure Team Pager [phone] (M-F; 7a - 5p)  Please contact [facility] for night-coverage after hours (5p -7a ) and weekends on [email or web address]  CRITICAL CARE Performed by: Christopher [name]   Total critical care time: 35 minutes  Critical care time was exclusive of separately billable procedures and treating other patients.  Critical care was necessary to treat or prevent imminent or life-threatening deterioration.  Critical care was time spent personally by me on the following activities: development of treatment plan with patient and/or surrogate as well as nursing, discussions with consultants, evaluation of  patient's response to treatment, examination of patient, obtaining history from patient or surrogate, ordering and performing treatments and interventions, ordering and review of laboratory studies, ordering and review of radiographic studies,  pulse oximetry and re-evaluation of patient's condition.

## 2023-10-13 NOTE — Progress Notes (Signed)
PCCM:  Heart failure team has agreed to take over care.   PCCM will sign off. Please call with any questions or concerns.   Christopher Igo, DO [facility] 10/13/2023 9:01 AM

## 2023-10-13 NOTE — Plan of Care (Signed)

## 2023-10-13 NOTE — Plan of Care (Signed)

## 2023-10-14 ENCOUNTER — Other Ambulatory Visit (HOSPITAL_COMMUNITY): Payer: Self-pay

## 2023-10-14 DIAGNOSIS — I5023 Acute on chronic systolic (congestive) heart failure: Secondary | ICD-10-CM

## 2023-10-14 DIAGNOSIS — N179 Acute kidney failure, unspecified: Secondary | ICD-10-CM

## 2023-10-14 DIAGNOSIS — F10931 Alcohol use, unspecified with withdrawal delirium: Secondary | ICD-10-CM

## 2023-10-14 DIAGNOSIS — R570 Cardiogenic shock: Secondary | ICD-10-CM | POA: Diagnosis not present

## 2023-10-14 LAB — COMPREHENSIVE METABOLIC PANEL
ALT: 24 U/L (ref 0–44)
AST: 30 U/L (ref 15–41)
Albumin: 3.1 g/dL — ABNORMAL LOW (ref 3.5–5.0)
Alkaline Phosphatase: 84 U/L (ref 38–126)
Anion gap: 12 (ref 5–15)
BUN: 65 mg/dL — ABNORMAL HIGH (ref 6–20)
CO2: 22 mmol/L (ref 22–32)
Calcium: 8.6 mg/dL — ABNORMAL LOW (ref 8.9–10.3)
Chloride: 100 mmol/L (ref 98–111)
Creatinine, Ser: 3.01 mg/dL — ABNORMAL HIGH (ref 0.61–1.24)
GFR, Estimated: 23 mL/min — ABNORMAL LOW (ref >60)
Glucose, Bld: 144 mg/dL — ABNORMAL HIGH (ref 70–99)
Potassium: 3.6 mmol/L (ref 3.5–5.1)
Sodium: 134 mmol/L — ABNORMAL LOW (ref 135–145)
Total Bilirubin: 1 mg/dL (ref 0.3–1.2)
Total Protein: 5.9 g/dL — ABNORMAL LOW (ref 6.5–8.1)

## 2023-10-14 LAB — GLUCOSE, CAPILLARY
Glucose-Capillary: 102 mg/dL — ABNORMAL HIGH (ref 70–99)
Glucose-Capillary: 78 mg/dL (ref 70–99)
Glucose-Capillary: 83 mg/dL (ref 70–99)
Glucose-Capillary: 151 mg/dL — ABNORMAL HIGH (ref 70–99)
Glucose-Capillary: 79 mg/dL (ref 70–99)
Glucose-Capillary: 88 mg/dL (ref 70–99)
Glucose-Capillary: 74 mg/dL (ref 70–99)

## 2023-10-14 LAB — COOXEMETRY PANEL
Carboxyhemoglobin: 1.7 % — ABNORMAL HIGH (ref 0.5–1.5)
Carboxyhemoglobin: 1.8 % — ABNORMAL HIGH (ref 0.5–1.5)
Methemoglobin: <0.7 % (ref 0.0–1.5)
Methemoglobin: <0.7 % (ref 0.0–1.5)
O2 Saturation: 76 %
O2 Saturation: 66.5 %
Total hemoglobin: 9.8 g/dL — ABNORMAL LOW (ref 12.0–16.0)
Total hemoglobin: 10.3 g/dL — ABNORMAL LOW (ref 12.0–16.0)

## 2023-10-14 LAB — BASIC METABOLIC PANEL
Anion gap: 9 (ref 5–15)
Anion gap: 8 (ref 5–15)
BUN: 63 mg/dL — ABNORMAL HIGH (ref 6–20)
BUN: 56 mg/dL — ABNORMAL HIGH (ref 6–20)
CO2: 24 mmol/L (ref 22–32)
CO2: 24 mmol/L (ref 22–32)
Calcium: 8.6 mg/dL — ABNORMAL LOW (ref 8.9–10.3)
Calcium: 8.2 mg/dL — ABNORMAL LOW (ref 8.9–10.3)
Chloride: 101 mmol/L (ref 98–111)
Chloride: 101 mmol/L (ref 98–111)
Creatinine, Ser: 2.71 mg/dL — ABNORMAL HIGH (ref 0.61–1.24)
Creatinine, Ser: 2.35 mg/dL — ABNORMAL HIGH (ref 0.61–1.24)
GFR, Estimated: 26 mL/min — ABNORMAL LOW (ref >60)
GFR, Estimated: 31 mL/min — ABNORMAL LOW (ref >60)
Glucose, Bld: 136 mg/dL — ABNORMAL HIGH (ref 70–99)
Glucose, Bld: 160 mg/dL — ABNORMAL HIGH (ref 70–99)
Potassium: 3.7 mmol/L (ref 3.5–5.1)
Potassium: 3.8 mmol/L (ref 3.5–5.1)
Sodium: 134 mmol/L — ABNORMAL LOW (ref 135–145)
Sodium: 133 mmol/L — ABNORMAL LOW (ref 135–145)

## 2023-10-14 LAB — PROTIME-INR
INR: 1.5 — ABNORMAL HIGH (ref 0.8–1.2)
Prothrombin Time: 18 s — ABNORMAL HIGH (ref 11.4–15.2)

## 2023-10-14 LAB — MAGNESIUM: Magnesium: 1.8 mg/dL (ref 1.7–2.4)

## 2023-10-14 MED ORDER — PHENOBARBITAL SODIUM 65 MG/ML IJ SOLN: 32.5000 mg | Freq: Three times a day (TID) | INTRAMUSCULAR | Status: DC

## 2023-10-14 MED ORDER — LORAZEPAM 2 MG/ML IJ SOLN
1.0000 mg | INTRAMUSCULAR | Status: DC | PRN
  Administered 2023-10-14 – 2023-10-26 (×3): 1 mg via INTRAVENOUS
  Filled 2023-10-14 (×3): qty 1

## 2023-10-14 MED ORDER — ACETAZOLAMIDE SODIUM 500 MG IJ SOLR
500.0000 mg | Freq: Once | INTRAMUSCULAR | Status: AC
  Administered 2023-10-14: 500 mg via INTRAVENOUS
  Filled 2023-10-14: qty 500

## 2023-10-14 MED ORDER — PHENOBARBITAL SODIUM 130 MG/ML IJ SOLN: 97.5000 mg | Freq: Three times a day (TID) | INTRAMUSCULAR | Status: DC

## 2023-10-14 MED ORDER — POTASSIUM CHLORIDE 10 MEQ/50ML IV SOLN
10.0000 meq | INTRAVENOUS | Status: AC
  Administered 2023-10-14 (×2): 10 meq via INTRAVENOUS
  Filled 2023-10-14 (×2): qty 50

## 2023-10-14 MED ORDER — MAGNESIUM SULFATE 2 GM/50ML IV SOLN
2.0000 g | Freq: Once | INTRAVENOUS | Status: AC
  Administered 2023-10-14: 2 g via INTRAVENOUS
  Filled 2023-10-14: qty 50

## 2023-10-14 MED ORDER — DIAZEPAM 5 MG PO TABS: 5.0000 mg | ORAL_TABLET | Freq: Two times a day (BID) | ORAL | Status: DC

## 2023-10-14 MED ORDER — SODIUM CHLORIDE 0.9 % IV SOLN
260.0000 mg | Freq: Once | INTRAVENOUS | Status: AC
  Administered 2023-10-14: 260 mg via INTRAVENOUS
  Filled 2023-10-14: qty 2

## 2023-10-14 MED ORDER — PHENOBARBITAL SODIUM 65 MG/ML IJ SOLN: 65.0000 mg | Freq: Three times a day (TID) | INTRAMUSCULAR | Status: DC

## 2023-10-14 MED ORDER — STERILE WATER FOR INJECTION IJ SOLN
INTRAMUSCULAR | Status: AC
  Administered 2023-10-14: 5 mL
  Filled 2023-10-14: qty 10

## 2023-10-14 MED ORDER — POTASSIUM CHLORIDE CRYS ER 20 MEQ PO TBCR: 40.0000 meq | EXTENDED_RELEASE_TABLET | Freq: Once | ORAL | Status: DC

## 2023-10-14 MED ORDER — PHENOBARBITAL SODIUM 130 MG/ML IJ SOLN
97.5000 mg | Freq: Three times a day (TID) | INTRAMUSCULAR | Status: AC
  Administered 2023-10-14 – 2023-10-16 (×6): 97.5 mg via INTRAVENOUS
  Filled 2023-10-14 (×6): qty 1

## 2023-10-14 MED ORDER — PHENOBARBITAL SODIUM 65 MG/ML IJ SOLN
65.0000 mg | Freq: Three times a day (TID) | INTRAMUSCULAR | Status: AC
  Administered 2023-10-16 – 2023-10-18 (×6): 65 mg via INTRAVENOUS
  Filled 2023-10-14 (×6): qty 1

## 2023-10-14 MED ORDER — PHENOBARBITAL SODIUM 65 MG/ML IJ SOLN
32.5000 mg | Freq: Three times a day (TID) | INTRAMUSCULAR | Status: AC
  Administered 2023-10-18 – 2023-10-20 (×5): 32.5 mg via INTRAVENOUS
  Filled 2023-10-14 (×6): qty 1

## 2023-10-14 NOTE — Progress Notes (Signed)
NAME:  Christopher [name], MRN:  [id number], DOB:  1966-01-24, LOS: 2 ADMISSION DATE:  10/12/2023 CONSULTATION DATE:  10/12/2023 REFERRING MD:  Bebe Shaggy - EDP CHIEF COMPLAINT:  Hypotension, shock   History of Present Illness:  57 year old man who presented to [facility] ED 10/26 for fatigue and BLE edema, progressive SOB x 2-3 weeks. PMHx significant for HTN, CHF, dilated CM (Echo 05/2019 with EF 20-25% with severe LV dilation, global hypokinesis, no RWMAs; severely reduced RV function with RVSP 60), gout, EtOH use, some tobacco use.  Patient presented to ED after reporting increased fatigue, SOB and BLE edema. Reports SOB x 1 month, progressively worsening over the last few weeks. He has felt more tired at work and called out this past week, which is out of character for him. Seen at PCP's office for SOB and acute onset of BLE edema with instructions to increase torsemide dose (20mg  to 40mg  daily). SOB felt significantly worsened 10/26AM and patient called EMS for evaluation. Denies fever/chills, CP, nausea/vomiting. Endorses SOB/cough, no hemoptysis. Endorses BLE edema. Patient does note that he has previously had low blood pressure requiring evaluation at [facility]; after that admission Coreg was stopped due to hypotension. Recently was told he may need to resume this due to an elevated HR.  On ED arrival, patient was afebrile, HR 95, BP 68/50, RR 9, SpO2 100%. Labs were notable for WBC 5.6, Hgb 11.1, Plt 98. Na 135, K 5.1, CO2 20, BUN/Cr 60/3.37, LFTs WNL with exception of mildly elevated Tbili (1.5). LA 2.0. Ethanol < 10. Trop 16. COVID/Flu/RSV negative. CXR with atelectasis RLL, small R pleural effusion, cardiomegaly without pulmonary edema.  PCCM consulted for ICU admission in the setting of shock (cardiogenic versus hypovolemic).  Pertinent Medical History:   Past Medical History:  Diagnosis Date   Alcohol use    CHF (congestive heart failure) (HCC)    Chronic alcohol use    CKD stage 3a, GFR  45-59 ml/min (HCC)    Dilated cardiomyopathy (HCC)    Gout    Hypertension    Personal history of nicotine dependence    Significant Hospital Events: Including procedures, antibiotic start and stop dates in addition to other pertinent events   10/26 - Presented to [facility] ED  Interim History / Subjective:  Patient was started actively withdrawing from alcohol requiring 21 mg of Ativan overnight Still on dobutamine and norepinephrine Remain afebrile Making good amount of urine with net negative fluid balance  Objective:  Blood pressure (!) 80/68, pulse 98, temperature (!) 97.1 F (36.2 C), temperature source Axillary, resp. rate (!) 28, height 5' 10.25" (1.784 m), weight 105.4 kg, SpO2 98%. CVP:  [0 mmHg-16 mmHg] 16 mmHg      Intake/Output Summary (Last 24 hours) at 10/14/2023 1008 Last data filed at 10/14/2023 0700 Gross per 24 hour  Intake 697.98 ml  Output 4065 ml  Net -3367.02 ml   Filed Weights   10/12/23 0700 10/13/23 1451 10/14/23 0500  Weight: 107.8 kg 108.2 kg 105.4 kg    Physical Examination: General: Middle-age obese male, lying on the bed HEENT: [place]/AT, eyes anicteric.  moist mucus membranes Neuro: Opens eyes with vocal stimuli, following simple commands, agitated and restless Chest: Coarse breath sounds, no wheezes or rhonchi Heart: Regular rate and rhythm, no murmurs or gallops Abdomen: Soft, nontender, nondistended, bowel sounds present Skin: No rash   Coox 66% Cr 2.71 Na 134  Resolved Hospital Problem List:    Assessment & Plan:  Acute on chronic biventricular  HFrEF with cardiogenic shock Hypervolemic hyponatremia Appreciate advanced heart failure team follow-up Initial coox was 35% Currently on dobutamine and Lasix infusion Coox improved to 66% this morning Making good amount of urine, remain net -3 L Continue vasopressor support with MAP goal 65 Echocardiogram showed EF 20 to 25% with right ventricular dysfunction, severe MR and TR likely  functional Holding GDMT in the setting of shock Monitor electrolytes  AKI on CKD stage 3a due to cardiorenal syndrome Patient was started on aggressive diuresis Lasix infusion with that his serum creatinine started trending down Monitor intake and output Avoid nephrotoxic agent  EtOH abuse Now in alcohol withdrawal with delirium tremors Patient started withdrawing from alcohol Requiring Ativan 21 mg overnight Will start him on high-dose phenobarb alcohol withdrawal dosing Continue thiamine  Anemia and thrombocytopenia due to bone marrow suppression by alcohol Monitor H&H and platelet count Watch for signs of bleeding  Tobacco abuse Smoking cessation counseling as appropriate  Obesity Diet and exercise counseling as appropriate   Best Practice: (right click and "Reselect all SmartList Selections" daily)   Diet/type: Regular consistency DVT prophylaxis:  SQH GI prophylaxis: N/A Lines: N/A Foley:  N/A Code Status:  full code Last date of multidisciplinary goals of care discussion: Per primary team  Labs:  CBC: Recent Labs  Lab 10/12/23 0507 10/12/23 0537 10/13/23 0413  WBC 5.6  --  6.2  HGB 11.1* 12.6* 9.3*  HCT 34.7* 37.0* 28.4*  MCV 107.1*  --  102.9*  PLT 98*  --  82*   Basic Metabolic Panel: Recent Labs  Lab 10/12/23 1800 10/13/23 0413 10/13/23 1852 10/14/23 0342 10/14/23 0759  NA 132* 131* 132* 134* 134*  K 4.8 4.2 4.2 3.6 3.7  CL 99 98 98 100 101  CO2 19* 20* 23 22 24   GLUCOSE 172* 173* 165* 144* 136*  BUN 67* 69* 69* 65* 63*  CREATININE 3.46* 3.39* 3.35* 3.01* 2.71*  CALCIUM 8.5* 8.4* 8.8* 8.6* 8.6*  MG  --  1.9  --  1.8  --   PHOS  --  5.8*  --   --   --    GFR: Estimated Creatinine Clearance: 35.8 mL/min (A) (by C-G formula based on SCr of 2.71 mg/dL (H)). Recent Labs  Lab 10/12/23 0507 10/12/23 0536 10/12/23 0652 10/12/23 1016 10/12/23 1353 10/12/23 1409 10/13/23 0413  PROCALCITON  --   --  <0.10  --   --   --   --   WBC 5.6  --    --   --   --   --  6.2  LATICACIDVEN  --  2.0*  --  1.5 1.0 0.8  --    Liver Function Tests: Recent Labs  Lab 10/12/23 0507 10/14/23 0342  AST 41 30  ALT 21 24  ALKPHOS 98 84  BILITOT 1.5* 1.0  PROT 6.8 5.9*  ALBUMIN 3.6 3.1*   No results for input(s): "LIPASE", "AMYLASE" in the last 168 hours. No results for input(s): "AMMONIA" in the last 168 hours.  ABG:    Component Value Date/Time   PHART 7.414 02/27/2011 0756   PCO2ART 35.1 02/27/2011 0756   PO2ART 76.0 (L) 02/27/2011 0756   HCO3 25.5 (H) 02/27/2011 0802   TCO2 21 (L) 10/12/2023 0537   ACIDBASEDEF 1.0 02/27/2011 0756   O2SAT 66.5 10/14/2023 0909   Coagulation Profile: Recent Labs  Lab 10/14/23 0342  INR 1.5*    Cardiac Enzymes: No results for input(s): "CKTOTAL", "CKMB", "CKMBINDEX", "TROPONINI" in the last 168  hours.  HbA1C: Hgb A1c MFr Bld  Date/Time Value Ref Range Status  10/13/2023 04:13 AM 4.4 (L) 4.8 - 5.6 % Final    Comment:    (NOTE) Pre diabetes:          5.7%-6.4%  Diabetes:              >6.4%  Glycemic control for   <7.0% adults with diabetes     CBG: Recent Labs  Lab 10/13/23 1532 10/13/23 2011 10/13/23 2342 10/14/23 0338 10/14/23 0808  GLUCAP 90 138* 102* 78 83    The patient is critically ill due to alcohol drawl with delirium.  Critical care was necessary to treat or prevent imminent or life-threatening deterioration.  Critical care was time spent personally by me on the following activities: development of treatment plan with patient and/or surrogate as well as nursing, discussions with consultants, evaluation of patient's response to treatment, examination of patient, obtaining history from patient or surrogate, ordering and performing treatments and interventions, ordering and review of laboratory studies, ordering and review of radiographic studies, pulse oximetry, re-evaluation of patient's condition and participation in multidisciplinary rounds.   During this encounter  critical care time was devoted to patient care services described in this note for 35 minutes.     Cheri Fowler, MD [facility] See Amion for pager If no response to pager, please call [phone] until 7pm After 7pm, Please call E-link [phone]

## 2023-10-14 NOTE — Progress Notes (Signed)
Personally checked CVP this afternoon. CVP 7. Has put out 3.4L UOP so far. Discussed with Dr. Gasper Lloyd. Will stop lasix gtt at 1700.   Brynda Peon, AGACNP-BC  Advanced Heart Failure Team

## 2023-10-14 NOTE — TOC Initial Note (Signed)
Transition of Care [facility]) - Initial/Assessment Note    Patient Details  Name: Christopher [name] MRN: [id number] Date of Birth: [birth year]  Transition of Care [facility] CM/SW Contact:    Elliot Cousin, RN Phone Number: [phone] 10/14/2023, 1:18 PM  Clinical Narrative:     CM spoke to pt's father, he lives at home. States pt was independent pta. States he drives to appts. Explained CM will continue to follow for dc needs. Will provide substance abuse resources when medically stable.             Expected Discharge Plan: Home/Self Care Barriers to Discharge: Continued Medical Work up   Patient Goals and CMS Choice            Expected Discharge Plan and Services   Discharge Planning Services: CM Consult   Living arrangements for the past 2 months: Apartment                                      Prior Living Arrangements/Services Living arrangements for the past 2 months: Apartment Lives with:: Self          Need for Family Participation in Patient Care: Yes (Comment) Care giver support system in place?: Yes (comment)   Criminal Activity/Legal Involvement Pertinent to Current Situation/Hospitalization: No - Comment as needed  Activities of Daily Living   ADL Screening (condition at time of admission) Independently performs ADLs?: Yes (appropriate for developmental age) Is the patient deaf or have difficulty hearing?: No Does the patient have difficulty seeing, even when wearing glasses/contacts?: No Does the patient have difficulty concentrating, remembering, or making decisions?: No  Permission Sought/Granted Permission sought to share information with : Case Manager, Family Supports    Share Information with NAME: Shoua Brintnall     Permission granted to share info w Relationship: father  Permission granted to share info w Contact Information: [phone]  Emotional Assessment Appearance:: Appears stated age Attitude/Demeanor/Rapport: Lethargic           Admission diagnosis:  Cardiogenic shock (HCC) [R57.0] AKI (acute kidney injury) (HCC) [N17.9] Patient Active Problem List   Diagnosis Date Noted   Cardiogenic shock (HCC) 10/12/2023   GI bleed 08/29/2020   AKI (acute kidney injury) (HCC) 08/29/2020   Hyponatremia 08/29/2020   Dilated 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 [place] [address] Phone: [phone] Fax: [phone]     Social Determinants of Health (SDOH) Social History: SDOH Screenings   Food Insecurity: No Food Insecurity (10/12/2023)  Housing: Medium Risk (10/12/2023)  Transportation Needs: No Transportation Needs (10/12/2023)  Utilities: Not At Risk (10/12/2023)  Financial Resource Strain: Low Risk  (05/28/2023)   Received from [facility] [place]  Tobacco Use: Medium Risk (10/12/2023)   SDOH Interventions:     Readmission Risk Interventions     No data to display

## 2023-10-14 NOTE — Plan of Care (Signed)

## 2023-10-14 NOTE — Progress Notes (Signed)
Advanced Heart Failure Rounding Note  PCP-Cardiologist: None   Subjective:   Echo 10/27 EF 20-25%, LV with GHK, RV mod reduced, mod elevated PASP, LA/RA severely dilated, severe MR/TR  On DBA @5 , NE @ 2. NE restarted after 21 mg of Ativan overnight d/t elevated CIWA  Remains on lasix gtt @15 . -3.8L UOP. Weight down 6lbs.   Very drowsy this morning after getting Ativan overnight.   Objective:   Weight Range: 105.4 kg Body mass index is 33.1 kg/m.   Vital Signs:   Temp:  [96.3 F (35.7 C)-98.7 F (37.1 C)] 96.4 F (35.8 C) (10/28 0418) Pulse Rate:  [85-174] 98 (10/28 0500) Resp:  [17-30] 18 (10/28 0500) BP: (71-105)/(51-88) 84/63 (10/28 0500) SpO2:  [89 %-100 %] 97 % (10/28 0500) Weight:  [105.4 kg-108.2 kg] 105.4 kg (10/28 0500) Last BM Date : 10/13/23  Weight change: Filed Weights   10/12/23 0700 10/13/23 1451 10/14/23 0500  Weight: 107.8 kg 108.2 kg 105.4 kg    Intake/Output:   Intake/Output Summary (Last 24 hours) at 10/14/2023 0722 Last data filed at 10/14/2023 0500 Gross per 24 hour  Intake 671.22 ml  Output 3340 ml  Net -2668.78 ml     CVP 14 Physical Exam  General:  drowsy appearing.  No respiratory difficulty HEENT: normal Neck: supple. JVD ~14 cm. Carotids 2+ bilat; no bruits. No lymphadenopathy or thyromegaly appreciated. Cor: PMI nondisplaced. Regular rate & rhythm. No rubs, gallops or murmurs. Lungs: clear Abdomen: soft, nontender, nondistended. No hepatosplenomegaly. No bruits or masses. Good bowel sounds. Extremities: no cyanosis, clubbing, rash, edema. PICC RUE Neuro: drowsy 2/2 ativan  Telemetry   ST low 100s (Personally reviewed)    Patient Profile   57 year old male with known history of CKD stage IIIa, pulmonary hypertension, and alcohol related dilated cardiomyopathy with chronic combined diastolic and systolic congestive heart failure who presents in cardiogenic shock.   Assessment/Plan  Cardiogenic shock: Patient presented  in cardiogenic shock, hypotensive with mildly elevated lactate.  He was placed on norepinephrine and subsequently dobutamine with improvement in his lactate.  His Coox was 35% which gives a cardiac index of 1.5 by assumed Fick.  Given ongoing heavy alcohol use, potential cirrhosis, and previous lost to follow-up would potentially consider MCS but only as a bridge to recovery, likely does not have any durable options. - Continue Dobutamine at 5, wean norepi  -Continue lasix gtt at 15, give diamox 500 mg IV x1 today. Follow response  -Holding GDMT with hypotensive cardiogenic shock -Trend Coox, improving with dobutamine -Poor advanced therapies candidate -Repeat echo 10/27 EF 20-25%, LV with GHK, RV mod reduced, mod elevated PASP, LA/RA severely dilated, severe MR/TR   AKI on CKD: Baseline likely around 1.6-1.9 based on outside labs, elevated to 3.7 on arrival.   - Likely secondary to cardiorenal syndrome. - 3 today - IV diuresis as above - avoid hypotension   Alcohol abuse: Drinks at least a pint of liquor every day, has not gone without alcohol in many years.  Denies withdrawal symptoms in the past. -Continue CIWA protocol, got 21 mg IV ativan overnight with high CIWA scores -Thiamine/folate  ?Cirrhosis: Based on soft BP, years of heavy alcohol use, and evidence of synthetic dysfunction (platelets, elevated bilirubin) is likely a possibility. PT-INR 18/1.5, can consider abdominal imaging when more stable. - MELD labs ordered - Abdominal imaging when more stable - If taking for a RHC, would obtain hepatic wedge pressure gradient   Length of Stay: 2  Alen Bleacher, NP  10/14/2023, 7:22 AM  Advanced Heart Failure Team Pager [phone] (M-F; 7a - 5p)  Please contact [facility] for night-coverage after hours (5p -7a ) and weekends on [email or web address]

## 2023-10-14 NOTE — Progress Notes (Signed)
Gave father an update at the bedside. Father unaware of ETOH use, briefly covered the subject and explained that it has something to do with why the patient was admitted. Very grateful for the update, wife at home sick so he was going home to take care of her. I updated his number in the chart. Asked [place] to call with updates/changes.   Brynda Peon, AGACNP-BC  Advanced Heart Failure Team

## 2023-10-14 NOTE — Progress Notes (Signed)
PCCM Attending:  Contacted by cardiology/heart failure service. Patient with more altered mentation overnight that improved some with Ativan per CIWA protocol. Patient with a known history of EtOH use regularly. Likely having withdrawal. Patient calm and comfortable at the time of my evaluation. Continuing CIWA protocol. Scheduling Valium 5 mg po bid. Continuing Thiamine daily. PCCM will continue to follow.  Donna Christen Jamison Neighbor, M.D. [facility] 2:46 AM 10/14/23   Please see Amion for pager details.  From 7A-7P if no response please call [phone] After hours, please call ELink [phone]

## 2023-10-15 ENCOUNTER — Inpatient Hospital Stay (HOSPITAL_COMMUNITY): Payer: Commercial Managed Care - PPO

## 2023-10-15 DIAGNOSIS — R570 Cardiogenic shock: Secondary | ICD-10-CM | POA: Diagnosis not present

## 2023-10-15 DIAGNOSIS — N179 Acute kidney failure, unspecified: Secondary | ICD-10-CM | POA: Diagnosis not present

## 2023-10-15 DIAGNOSIS — I5023 Acute on chronic systolic (congestive) heart failure: Secondary | ICD-10-CM | POA: Diagnosis not present

## 2023-10-15 LAB — GLUCOSE, CAPILLARY
Glucose-Capillary: 114 mg/dL — ABNORMAL HIGH (ref 70–99)
Glucose-Capillary: 75 mg/dL (ref 70–99)
Glucose-Capillary: 76 mg/dL (ref 70–99)
Glucose-Capillary: 77 mg/dL (ref 70–99)
Glucose-Capillary: 81 mg/dL (ref 70–99)
Glucose-Capillary: 85 mg/dL (ref 70–99)

## 2023-10-15 LAB — BASIC METABOLIC PANEL
Anion gap: 12 (ref 5–15)
BUN: 51 mg/dL — ABNORMAL HIGH (ref 6–20)
CO2: 25 mmol/L (ref 22–32)
Calcium: 9.3 mg/dL (ref 8.9–10.3)
Chloride: 100 mmol/L (ref 98–111)
Creatinine, Ser: 2.15 mg/dL — ABNORMAL HIGH (ref 0.61–1.24)
GFR, Estimated: 35 mL/min — ABNORMAL LOW (ref >60)
Glucose, Bld: 85 mg/dL (ref 70–99)
Potassium: 3.4 mmol/L — ABNORMAL LOW (ref 3.5–5.1)
Sodium: 137 mmol/L (ref 135–145)

## 2023-10-15 LAB — COOXEMETRY PANEL
Carboxyhemoglobin: 2.3 % — ABNORMAL HIGH (ref 0.5–1.5)
Methemoglobin: <0.7 % (ref 0.0–1.5)
O2 Saturation: 64.6 %
Total hemoglobin: 12 g/dL (ref 12.0–16.0)

## 2023-10-15 LAB — MAGNESIUM: Magnesium: 2.2 mg/dL (ref 1.7–2.4)

## 2023-10-15 MED ORDER — POTASSIUM CHLORIDE 10 MEQ/50ML IV SOLN
10.0000 meq | INTRAVENOUS | Status: AC
  Administered 2023-10-15 (×2): 10 meq via INTRAVENOUS
  Filled 2023-10-15 (×2): qty 50

## 2023-10-15 MED ORDER — SODIUM CHLORIDE 0.9 % IV SOLN: 250.0000 mL | INTRAVENOUS | Status: AC

## 2023-10-15 MED ORDER — NOREPINEPHRINE 4 MG/250ML-% IV SOLN
2.0000 ug/min | INTRAVENOUS | Status: DC
  Filled 2023-10-15: qty 250

## 2023-10-15 MED ORDER — NOREPINEPHRINE 4 MG/250ML-% IV SOLN
0.0000 ug/min | INTRAVENOUS | Status: DC
  Administered 2023-10-15: 5 ug/min via INTRAVENOUS
  Administered 2023-10-16: 14 ug/min via INTRAVENOUS
  Administered 2023-10-16: 15 ug/min via INTRAVENOUS
  Filled 2023-10-15 (×3): qty 250

## 2023-10-15 NOTE — Plan of Care (Signed)

## 2023-10-15 NOTE — Progress Notes (Signed)
Advanced Heart Failure Rounding Note  PCP-Cardiologist: None   Subjective:   Echo 10/27 EF 20-25%, LV with GHK, RV mod reduced, mod elevated PASP, LA/RA severely dilated, severe MR/TR  On DBA @5   Lasix gtt turned off yesterday. Diuresed well with IV lasix and diamox. Down 10lbs and put out >6L UOP.   Drowsy but able to answer questions this morning.   Objective:   Weight Range: 100.7 kg Body mass index is 31.63 kg/m.   Vital Signs:   Temp:  [97.1 F (36.2 C)-97.7 F (36.5 C)] 97.6 F (36.4 C) (10/29 0730) Pulse Rate:  [79-211] 83 (10/29 0700) Resp:  [13-34] 13 (10/29 0700) BP: (55-129)/(36-92) 102/76 (10/29 0700) SpO2:  [93 %-100 %] 100 % (10/29 0700) Weight:  [100.7 kg] 100.7 kg (10/29 0500) Last BM Date : 10/13/23  Weight change: Filed Weights   10/13/23 1451 10/14/23 0500 10/15/23 0500  Weight: 108.2 kg 105.4 kg 100.7 kg    Intake/Output:   Intake/Output Summary (Last 24 hours) at 10/15/2023 0740 Last data filed at 10/15/2023 0700 Gross per 24 hour  Intake 587.05 ml  Output 6250 ml  Net -5662.95 ml     CVP 3  Physical Exam  General:  drowsy appearing.  No respiratory difficulty HEENT: normal Neck: supple. JVD flat cm. Carotids 2+ bilat; no bruits. No lymphadenopathy or thyromegaly appreciated. Cor: PMI nondisplaced. Regular rate & rhythm. No rubs, gallops or murmurs. Lungs: clear Abdomen: soft, nontender, nondistended. No hepatosplenomegaly. No bruits or masses. Good bowel sounds. Extremities: no cyanosis, clubbing, rash, edema  Neuro: alert & oriented x 2  Telemetry   NSR 80s, PVCs (Personally reviewed)    Patient Profile   57 year old male with known history of CKD stage IIIa, pulmonary hypertension, and alcohol related dilated cardiomyopathy with chronic combined diastolic and systolic congestive heart failure who presents in cardiogenic shock.   Assessment/Plan  1. Cardiogenic shock: Patient presented in cardiogenic shock, hypotensive  with mildly elevated lactate.  He was placed on norepinephrine and subsequently dobutamine with improvement in his lactate.  His Coox was 35% which gives a cardiac index of 1.5 by assumed Fick.  Given ongoing heavy alcohol use, potential cirrhosis, and previous lost to follow-up would potentially consider MCS but only as a bridge to recovery, likely does not have any durable options. -Diuresed well with lasix gtt and diamox. - UOP, down 10 lbs. Lasix gtt stopped yesterday. CVP 3 today, hold diuretic.  -Holding GDMT with hypotensive cardiogenic shock, also limited by renal function -Consider adding spiro 12.5 mg daily tomorrow -co-ox 65%, NE turned off yesterday. Will decrease DBA 5>2.5 today -Poor advanced therapies candidate -Repeat echo 10/27 EF 20-25%, LV with GHK, RV mod reduced, mod elevated PASP, LA/RA severely dilated, severe MR/TR   2. AKI on CKD: Baseline likely around 1.6-1.9 based on outside labs, elevated to 3.7 on arrival.   - Likely secondary to cardiorenal syndrome. - 2.15 today - avoid hypotension   3. Alcohol abuse: Drinks at least a pint of liquor every day, has not gone without alcohol in many years.  Denies withdrawal symptoms in the past. -Continue CIWA protocol, now on phenobarbital, no ativan overnight  -Thiamine/folate  4. ?Cirrhosis: Based on soft BP, years of heavy alcohol use, and evidence of synthetic dysfunction (platelets, elevated bilirubin) is likely a possibility. PT-INR 18/1.5, can consider abdominal imaging when more stable. - MELD labs ordered - Abdominal imaging when more stable - If taking for a RHC, would obtain hepatic  wedge pressure gradient   Length of Stay: 3  Alen Bleacher, NP  10/15/2023, 7:40 AM  Advanced Heart Failure Team Pager [phone] (M-F; 7a - 5p)  Please contact [facility] for night-coverage after hours (5p -7a ) and weekends on [email or web address]

## 2023-10-15 NOTE — Progress Notes (Signed)
NAME:  DAMARIS REHL, MRN:  [id number], DOB:  1966-08-22, LOS: 3 ADMISSION DATE:  10/12/2023 CONSULTATION DATE:  10/12/2023 REFERRING MD:  Bebe Shaggy - EDP CHIEF COMPLAINT:  Hypotension, shock   History of Present Illness:  57 year old man who presented to [facility] ED 10/26 for fatigue and BLE edema, progressive SOB x 2-3 weeks. PMHx significant for HTN, CHF, dilated CM (Echo 05/2019 with EF 20-25% with severe LV dilation, global hypokinesis, no RWMAs; severely reduced RV function with RVSP 60), gout, EtOH use, some tobacco use.  Patient presented to ED after reporting increased fatigue, SOB and BLE edema. Reports SOB x 1 month, progressively worsening over the last few weeks. He has felt more tired at work and called out this past week, which is out of character for him. Seen at PCP's office for SOB and acute onset of BLE edema with instructions to increase torsemide dose (20mg  to 40mg  daily). SOB felt significantly worsened 10/26AM and patient called EMS for evaluation. Denies fever/chills, CP, nausea/vomiting. Endorses SOB/cough, no hemoptysis. Endorses BLE edema. Patient does note that he has previously had low blood pressure requiring evaluation at [facility]; after that admission Coreg was stopped due to hypotension. Recently was told he may need to resume this due to an elevated HR.  On ED arrival, patient was afebrile, HR 95, BP 68/50, RR 9, SpO2 100%. Labs were notable for WBC 5.6, Hgb 11.1, Plt 98. Na 135, K 5.1, CO2 20, BUN/Cr 60/3.37, LFTs WNL with exception of mildly elevated Tbili (1.5). LA 2.0. Ethanol < 10. Trop 16. COVID/Flu/RSV negative. CXR with atelectasis RLL, small R pleural effusion, cardiomegaly without pulmonary edema.  PCCM consulted for ICU admission in the setting of shock (cardiogenic versus hypovolemic).  Pertinent Medical History:   Past Medical History:  Diagnosis Date   Alcohol use    CHF (congestive heart failure) (HCC)    Chronic alcohol use    CKD stage 3a, GFR  45-59 ml/min (HCC)    Dilated cardiomyopathy (HCC)    Gout    Hypertension    Personal history of nicotine dependence    Significant Hospital Events: Including procedures, antibiotic start and stop dates in addition to other pertinent events   10/26 - Presented to [facility] ED  Interim History / Subjective:  Patient stated he drinks large amount of alcohol Started on phenobarbital with improvement in agitation He put out more than 6 L of urine, remain net negative and lost almost 10 pound weight Mental status has improved Came off of norepinephrine  Remain on dobutamine 5 Lasix infusion was stopped  Objective:  Blood pressure 102/76, pulse 83, temperature 97.6 F (36.4 C), temperature source Axillary, resp. rate 13, height 5' 10.25" (1.784 m), weight 100.7 kg, SpO2 100%. CVP:  [5 mmHg-18 mmHg] 5 mmHg      Intake/Output Summary (Last 24 hours) at 10/15/2023 0811 Last data filed at 10/15/2023 0700 Gross per 24 hour  Intake 561.81 ml  Output 6250 ml  Net -5688.19 ml   Filed Weights   10/13/23 1451 10/14/23 0500 10/15/23 0500  Weight: 108.2 kg 105.4 kg 100.7 kg    Physical Examination: General: Middle-aged obese male, lying on the bed HEENT: [place]/AT, eyes anicteric.  moist mucus membranes Neuro: Sleepy, opens eyes with vocal stimuli, oriented to place and time, moving all 4 extremities Chest: Coarse breath sounds, no wheezes or rhonchi Heart: Regular rate and rhythm, no murmurs or gallops Abdomen: Soft, nontender, nondistended, bowel sounds present Skin: No rash   Coox  64% Cr 2.15 Na 137 K 3.4  Resolved Hospital Problem List:  Hypervolemic hyponatremia  Assessment & Plan:  Acute on chronic biventricular HFrEF with cardiogenic shock Hypokalemia Appreciate advanced heart failure team follow-up Initial coox was 35% Currently on dobutamine Lasix infusion was stopped yesterday, patient made 6 L of urine in last 24 hours with net negative over 4 L, his weight is down by 10  pounds Coox 64% Holding diuretics today Came off of Levophed Echocardiogram showed EF 20 to 25% with right ventricular dysfunction, severe MR and TR likely functional Holding GDMT in the setting of shock Monitor electrolytes Serum sodium improved to 137 Continue aggressive potassium replacement  AKI on CKD stage 3a due to cardiorenal syndrome Serum creatinine started trending down, yesterday it was 2.71 and now it is 2.15 Monitor intake and output Avoid nephrotoxic agent  EtOH abuse Now in alcohol withdrawal with delirium tremors Patient drinks about vent of liquor every day He was started on high-dose phenobarb alcohol withdrawal protocol Not requiring Ativan Mental status has improved Continue thiamine  Anemia and thrombocytopenia due to bone marrow suppression by alcohol Monitor H&H and platelet count Watch for signs of bleeding  Tobacco abuse Smoking cessation counseling as appropriate  Obesity Diet and exercise counseling as appropriate   Best Practice: (right click and "Reselect all SmartList Selections" daily)   Diet/type: Regular consistency DVT prophylaxis:  SQH GI prophylaxis: N/A Lines: N/A Foley:  N/A Code Status:  full code Last date of multidisciplinary goals of care discussion: Per primary team  Labs:  CBC: Recent Labs  Lab 10/12/23 0507 10/12/23 0537 10/13/23 0413  WBC 5.6  --  6.2  HGB 11.1* 12.6* 9.3*  HCT 34.7* 37.0* 28.4*  MCV 107.1*  --  102.9*  PLT 98*  --  82*   Basic Metabolic Panel: Recent Labs  Lab 10/13/23 0413 10/13/23 1852 10/14/23 0342 10/14/23 0759 10/14/23 1357 10/15/23 0615  NA 131* 132* 134* 134* 133* 137  K 4.2 4.2 3.6 3.7 3.8 3.4*  CL 98 98 100 101 101 100  CO2 20* 23 22 24 24 25   GLUCOSE 173* 165* 144* 136* 160* 85  BUN 69* 69* 65* 63* 56* 51*  CREATININE 3.39* 3.35* 3.01* 2.71* 2.35* 2.15*  CALCIUM 8.4* 8.8* 8.6* 8.6* 8.2* 9.3  MG 1.9  --  1.8  --   --  2.2  PHOS 5.8*  --   --   --   --   --     GFR: Estimated Creatinine Clearance: 44.2 mL/min (A) (by C-G formula based on SCr of 2.15 mg/dL (H)). Recent Labs  Lab 10/12/23 0507 10/12/23 0536 10/12/23 0652 10/12/23 1016 10/12/23 1353 10/12/23 1409 10/13/23 0413  PROCALCITON  --   --  <0.10  --   --   --   --   WBC 5.6  --   --   --   --   --  6.2  LATICACIDVEN  --  2.0*  --  1.5 1.0 0.8  --    Liver Function Tests: Recent Labs  Lab 10/12/23 0507 10/14/23 0342  AST 41 30  ALT 21 24  ALKPHOS 98 84  BILITOT 1.5* 1.0  PROT 6.8 5.9*  ALBUMIN 3.6 3.1*   No results for input(s): "LIPASE", "AMYLASE" in the last 168 hours. No results for input(s): "AMMONIA" in the last 168 hours.  ABG:    Component Value Date/Time   PHART 7.414 02/27/2011 0756   PCO2ART 35.1 02/27/2011 0756  PO2ART 76.0 (L) 02/27/2011 0756   HCO3 25.5 (H) 02/27/2011 0802   TCO2 21 (L) 10/12/2023 0537   ACIDBASEDEF 1.0 02/27/2011 0756   O2SAT 64.6 10/15/2023 0440   Coagulation Profile: Recent Labs  Lab 10/14/23 0342  INR 1.5*    Cardiac Enzymes: No results for input(s): "CKTOTAL", "CKMB", "CKMBINDEX", "TROPONINI" in the last 168 hours.  HbA1C: Hgb A1c MFr Bld  Date/Time Value Ref Range Status  10/13/2023 04:13 AM 4.4 (L) 4.8 - 5.6 % Final    Comment:    (NOTE) Pre diabetes:          5.7%-6.4%  Diabetes:              >6.4%  Glycemic control for   <7.0% adults with diabetes     CBG: Recent Labs  Lab 10/14/23 1626 10/14/23 2034 10/15/23 0009 10/15/23 0413 10/15/23 0729  GLUCAP 88 74 76 81 77    The patient is critically ill due to alcohol drawl with delirium.  Critical care was necessary to treat or prevent imminent or life-threatening deterioration.  Critical care was time spent personally by me on the following activities: development of treatment plan with patient and/or surrogate as well as nursing, discussions with consultants, evaluation of patient's response to treatment, examination of patient, obtaining history from  patient or surrogate, ordering and performing treatments and interventions, ordering and review of laboratory studies, ordering and review of radiographic studies, pulse oximetry, re-evaluation of patient's condition and participation in multidisciplinary rounds.   During this encounter critical care time was devoted to patient care services described in this note for 32 minutes.     Cheri Fowler, MD [facility] See Amion for pager If no response to pager, please call [phone] until 7pm After 7pm, Please call E-link [phone]

## 2023-10-16 ENCOUNTER — Inpatient Hospital Stay (HOSPITAL_COMMUNITY): Payer: Commercial Managed Care - PPO

## 2023-10-16 DIAGNOSIS — I5023 Acute on chronic systolic (congestive) heart failure: Secondary | ICD-10-CM | POA: Diagnosis not present

## 2023-10-16 DIAGNOSIS — N179 Acute kidney failure, unspecified: Secondary | ICD-10-CM | POA: Diagnosis not present

## 2023-10-16 DIAGNOSIS — R570 Cardiogenic shock: Secondary | ICD-10-CM | POA: Diagnosis not present

## 2023-10-16 LAB — BASIC METABOLIC PANEL
Anion gap: 11 (ref 5–15)
BUN: 50 mg/dL — ABNORMAL HIGH (ref 6–20)
CO2: 22 mmol/L (ref 22–32)
Calcium: 8.5 mg/dL — ABNORMAL LOW (ref 8.9–10.3)
Chloride: 96 mmol/L — ABNORMAL LOW (ref 98–111)
Creatinine, Ser: 2.01 mg/dL — ABNORMAL HIGH (ref 0.61–1.24)
GFR, Estimated: 38 mL/min — ABNORMAL LOW (ref >60)
Glucose, Bld: 215 mg/dL — ABNORMAL HIGH (ref 70–99)
Potassium: 3.8 mmol/L (ref 3.5–5.1)
Sodium: 129 mmol/L — ABNORMAL LOW (ref 135–145)

## 2023-10-16 LAB — COOXEMETRY PANEL
Carboxyhemoglobin: 1.9 % — ABNORMAL HIGH (ref 0.5–1.5)
Methemoglobin: <0.7 % (ref 0.0–1.5)
O2 Saturation: 68.4 %
Total hemoglobin: 11.3 g/dL — ABNORMAL LOW (ref 12.0–16.0)

## 2023-10-16 LAB — CBC
HCT: 31.4 % — ABNORMAL LOW (ref 39.0–52.0)
Hemoglobin: 10.4 g/dL — ABNORMAL LOW (ref 13.0–17.0)
MCH: 34.3 pg — ABNORMAL HIGH (ref 26.0–34.0)
MCHC: 33.1 g/dL (ref 30.0–36.0)
MCV: 103.6 fL — ABNORMAL HIGH (ref 80.0–100.0)
Platelets: 107 10*3/uL — ABNORMAL LOW (ref 150–400)
RBC: 3.03 MIL/uL — ABNORMAL LOW (ref 4.22–5.81)
RDW: 13.6 % (ref 11.5–15.5)
WBC: 8.2 10*3/uL (ref 4.0–10.5)
nRBC: 0 % (ref 0.0–0.2)

## 2023-10-16 LAB — GLUCOSE, CAPILLARY
Glucose-Capillary: 111 mg/dL — ABNORMAL HIGH (ref 70–99)
Glucose-Capillary: 130 mg/dL — ABNORMAL HIGH (ref 70–99)
Glucose-Capillary: 171 mg/dL — ABNORMAL HIGH (ref 70–99)
Glucose-Capillary: 244 mg/dL — ABNORMAL HIGH (ref 70–99)
Glucose-Capillary: 56 mg/dL — ABNORMAL LOW (ref 70–99)
Glucose-Capillary: 75 mg/dL (ref 70–99)
Glucose-Capillary: 96 mg/dL (ref 70–99)
Glucose-Capillary: 98 mg/dL (ref 70–99)

## 2023-10-16 LAB — MAGNESIUM: Magnesium: 2.1 mg/dL (ref 1.7–2.4)

## 2023-10-16 LAB — CG4 I-STAT (LACTIC ACID): Lactic Acid, Venous: 1.3 mmol/L (ref 0.5–1.9)

## 2023-10-16 MED ORDER — INSULIN ASPART 100 UNIT/ML IJ SOLN
0.0000 [IU] | INTRAMUSCULAR | Status: DC
Start: 2023-10-16 — End: 2023-10-18
  Administered 2023-10-16: 1 [IU] via SUBCUTANEOUS
  Administered 2023-10-17: 2 [IU] via SUBCUTANEOUS

## 2023-10-16 MED ORDER — ALTEPLASE 2 MG IJ SOLR
2.0000 mg | Freq: Once | INTRAMUSCULAR | Status: AC
  Administered 2023-10-16: 2 mg
  Filled 2023-10-16: qty 2

## 2023-10-16 MED ORDER — SODIUM CHLORIDE 0.9% FLUSH
10.0000 mL | Freq: Two times a day (BID) | INTRAVENOUS | Status: DC
  Administered 2023-10-16 – 2023-10-18 (×5): 10 mL via INTRAVENOUS

## 2023-10-16 MED ORDER — POTASSIUM CHLORIDE CRYS ER 20 MEQ PO TBCR
40.0000 meq | EXTENDED_RELEASE_TABLET | Freq: Once | ORAL | Status: AC
  Administered 2023-10-16: 40 meq via ORAL
  Filled 2023-10-16: qty 2

## 2023-10-16 MED ORDER — FUROSEMIDE 10 MG/ML IJ SOLN
80.0000 mg | Freq: Once | INTRAMUSCULAR | Status: AC
Start: 2023-10-16 — End: 2023-10-16
  Administered 2023-10-16: 80 mg via INTRAVENOUS
  Filled 2023-10-16: qty 8

## 2023-10-16 NOTE — Plan of Care (Signed)
Patient mentation improving.  Stood at bedside several times and transferred to chair.  Weaning levo throughout day.

## 2023-10-16 NOTE — Progress Notes (Signed)
Advanced Heart Failure Rounding Note  PCP-Cardiologist: None   Subjective:   Echo 10/27 EF 20-25%, LV with GHK, RV mod reduced, mod elevated PASP, LA/RA severely dilated, severe MR/TR 10/29 DBA cut back  to 2.5 mcg. CVP low. Diuretics held.   Over night hypotensive. Noreip restarted. Currently on DBA @ 2.5 + Norepi 13 mcg.    Denies SOB   Objective:   Weight Range: 103.1 kg Body mass index is 32.38 kg/m.   Vital Signs:   Temp:  [97.5 F (36.4 C)-98 F (36.7 C)] 97.6 F (36.4 C) (10/30 0801) Pulse Rate:  [72-225] 118 (10/30 0745) Resp:  [13-39] 15 (10/30 0745) BP: (67-113)/(47-92) 99/74 (10/30 0745) SpO2:  [80 %-100 %] 100 % (10/30 0745) Weight:  [103.1 kg] 103.1 kg (10/30 0630) Last BM Date : 10/13/23  Weight change: Filed Weights   10/14/23 0500 10/15/23 0500 10/16/23 0630  Weight: 105.4 kg 100.7 kg 103.1 kg    Intake/Output:   Intake/Output Summary (Last 24 hours) at 10/16/2023 0809 Last data filed at 10/16/2023 0600 Gross per 24 hour  Intake 1552.3 ml  Output 650 ml  Net 902.3 ml    CVP 12-13   Physical Exam  General:  . No resp difficulty HEENT: normal Neck: supple. JVP 11-12. Carotids 2+ bilat; no bruits. No lymphadenopathy or thryomegaly appreciated. Cor: PMI nondisplaced. Regular rate & rhythm. No rubs, gallops or murmurs. Lungs: clear Abdomen: soft, nontender, nondistended. No hepatosplenomegaly. No bruits or masses. Good bowel sounds. Extremities: no cyanosis, clubbing, rash, edema Neuro: alert & orientedx3, cranial nerves grossly intact. moves all 4 extremities w/o difficulty. Affect pleasant  Telemetry   ST-ST frequent PVCs.    Patient Profile   57 year old male with known history of CKD stage IIIa, pulmonary hypertension, and alcohol related dilated cardiomyopathy with chronic combined diastolic and systolic congestive heart failure who presents in cardiogenic shock.   Assessment/Plan  1. Cardiogenic shock: Patient presented in  cardiogenic shock, hypotensive with mildly elevated lactate.  He was placed on norepinephrine and subsequently dobutamine with improvement in his lactate.  His Coox was 35% which gives a cardiac index of 1.5 by assumed Fick.  Given ongoing heavy alcohol use, potential cirrhosis, and previous lost to follow-up would potentially consider MCS but only as a bridge to recovery, likely does not have any durable options. -Hypotensive overnight. Restarted norepi. Currently on DBA 2.5 mcg + Norepi 13 mcg. Check Lactic acid. Place Arterial line.  - CVP trending up. Give 80 mg IV lasix now.  -Holding GDMT with hypotensive cardiogenic shock -Poor advanced therapies candidate -Repeat echo 10/27 EF 20-25%, LV with GHK, RV mod reduced, mod elevated PASP, LA/RA severely dilated, severe MR/TR   2. AKI on CKD: Baseline likely around 1.6-1.9 based on outside labs, elevated to 3.7 on arrival.   - Likely secondary to cardiorenal syndrome. - 2.0  today - avoid hypotension   3. Alcohol abuse: Drinks at least a pint of liquor every day, has not gone without alcohol in many years.  Denies withdrawal symptoms in the past. -Continue CIWA protocol, now on phenobarbital, no ativan overnight  -Thiamine/folate  4. ?Cirrhosis: Based on soft BP, years of heavy alcohol use, and evidence of synthetic dysfunction (platelets, elevated bilirubin) is likely a possibility. PT-INR 18/1.5, can consider abdominal imaging when more stable. - MELD labs ordered - Abdominal imaging when more stable - If taking for a RHC, would obtain hepatic wedge pressure gradient  Discussed with CCM.   Length  of Stay: 4  Tonye Becket, NP  10/16/2023, 8:09 AM  Advanced Heart Failure Team Pager [phone] (M-F; 7a - 5p)  Please contact [facility] for night-coverage after hours (5p -7a ) and weekends on [email or web address]

## 2023-10-16 NOTE — TOC Initial Note (Signed)
Transition of Care [facility]) - Initial/Assessment Note    Patient Details  Name: Christopher [name] MRN: [id number] Date of Birth: 12-17-1966  [facility] CM/SW Contact:    Elliot Cousin, RN Phone Number: [phone] 10/16/2023, 4:03 PM  Clinical Narrative:     Patient believes he can quit on his own, but open to information about AA/NA programs. Will provide pt with meeting in [place] and other substance abuse resources.  States he does not drink and drive.  Pt verbalized the dangers of continuing to drink with heart conditions.  Pt has scale at home for daily weights. Provided pt with Living Better with Heart Failure. Educated on daily weights and eating a heart healthy/low sodium diet.                 Expected Discharge Plan: Home/Self Care Barriers to Discharge: Continued Medical Work up   Patient Goals and CMS Choice            Expected Discharge Plan and Services   Discharge Planning Services: CM Consult   Living arrangements for the past 2 months: Apartment                                      Prior Living Arrangements/Services Living arrangements for the past 2 months: Apartment Lives with:: Self          Need for Family Participation in Patient Care: No (Comment) Care giver support system in place?: No (comment)   Criminal Activity/Legal Involvement Pertinent to Current Situation/Hospitalization: No - Comment as needed  Activities of Daily Living   ADL Screening (condition at time of admission) Independently performs ADLs?: Yes (appropriate for developmental age) Is the patient deaf or have difficulty hearing?: No Does the patient have difficulty seeing, even when wearing glasses/contacts?: No Does the patient have difficulty concentrating, remembering, or making decisions?: No  Permission Sought/Granted Permission sought to share information with : Case Manager, Family Supports    Share Information with NAME: Tajon Prestage     Permission granted to share info w Relationship: father  Permission granted to share info w Contact Information: [phone]  Emotional Assessment Appearance:: Appears stated age Attitude/Demeanor/Rapport: Engaged Affect (typically observed): Accepting Orientation: : Oriented to Self, Oriented to Place, Oriented to  Time, Oriented to Situation   Psych Involvement: No (comment)  Admission diagnosis:  Cardiogenic shock (HCC) [R57.0] AKI (acute kidney injury) (HCC) [N17.9] Patient Active Problem List   Diagnosis Date Noted   Cardiogenic shock (HCC) 10/12/2023   GI bleed 08/29/2020   AKI (acute kidney injury) (HCC) 08/29/2020   Hyponatremia 08/29/2020   Dilated cardiomyopathy secondary to alcohol (HCC) 05/06/2019   Essential hypertension 05/06/2019   Acute on chronic systolic heart failure (HCC) 05/06/2019   Obesity 05/06/2019   PCP:  Kirby Funk, MD (Inactive) Pharmacy:   [facility] [address] [place] [address] Phone: [phone] Fax: [phone]     Social Determinants of Health (SDOH) Social History: SDOH Screenings   Food Insecurity: No Food Insecurity (10/12/2023)  Housing: Medium Risk (10/12/2023)  Transportation Needs: No Transportation Needs (10/12/2023)  Utilities: Not At Risk (10/12/2023)  Financial Resource Strain: Low Risk  (05/28/2023)   [facility]  Tobacco Use: Medium Risk (10/12/2023)   SDOH Interventions:     Readmission Risk Interventions  No data to display

## 2023-10-16 NOTE — Progress Notes (Signed)
NAME:  Christopher [name], MRN:  [id number], DOB:  [birth year], LOS: 4 ADMISSION DATE:  10/12/2023 CONSULTATION DATE:  10/12/2023 REFERRING MD:  Bebe Shaggy - EDP CHIEF COMPLAINT:  Hypotension, shock   History of Present Illness:  57 year old man who presented to [facility] ED 10/26 for fatigue and BLE edema, progressive SOB x 2-3 weeks. PMHx significant for HTN, CHF, dilated CM (Echo 05/2019 with EF 20-25% with severe LV dilation, global hypokinesis, no RWMAs; severely reduced RV function with RVSP 60), gout, EtOH use, some tobacco use.  Patient presented to ED after reporting increased fatigue, SOB and BLE edema. Reports SOB x 1 month, progressively worsening over the last few weeks. He has felt more tired at work and called out this past week, which is out of character for him. Seen at PCP's office for SOB and acute onset of BLE edema with instructions to increase torsemide dose (20mg  to 40mg  daily). SOB felt significantly worsened 10/26AM and patient called EMS for evaluation. Denies fever/chills, CP, nausea/vomiting. Endorses SOB/cough, no hemoptysis. Endorses BLE edema. Patient does note that he has previously had low blood pressure requiring evaluation at [facility]; after that admission Coreg was stopped due to hypotension. Recently was told he may need to resume this due to an elevated HR.  On ED arrival, patient was afebrile, HR 95, BP 68/50, RR 9, SpO2 100%. Labs were notable for WBC 5.6, Hgb 11.1, Plt 98. Na 135, K 5.1, CO2 20, BUN/Cr 60/3.37, LFTs WNL with exception of mildly elevated Tbili (1.5). LA 2.0. Ethanol < 10. Trop 16. COVID/Flu/RSV negative. CXR with atelectasis RLL, small R pleural effusion, cardiomegaly without pulmonary edema.  PCCM consulted for ICU admission in the setting of shock (cardiogenic versus hypovolemic).  Pertinent Medical History:   Past Medical History:  Diagnosis Date   Alcohol use    CHF (congestive heart failure) (HCC)    Chronic alcohol use    CKD stage 3a, GFR  45-59 ml/min (HCC)    Dilated cardiomyopathy (HCC)    Gout    Hypertension    Personal history of nicotine dependence    Significant Hospital Events: Including procedures, antibiotic start and stop dates in addition to other pertinent events   10/26 - Presented to [facility] ED  Interim History / Subjective:  Remain afebrile Did not require any Ativan in last 24 hours Remain on phenobarbital Mental status has improved significantly  Urine output decreased, went back on norepinephrine, currently on 15 L Made only half liter of urine in last 24 hours Still on dobutamine at 2.5  Objective:  Blood pressure 94/71, pulse (!) 114, temperature 97.6 F (36.4 C), temperature source Oral, resp. rate (!) 21, height 5' 10.25" (1.784 m), weight 103.1 kg, SpO2 99%. CVP:  [3 mmHg-71 mmHg] 12 mmHg      Intake/Output Summary (Last 24 hours) at 10/16/2023 0856 Last data filed at 10/16/2023 0600 Gross per 24 hour  Intake 1552.3 ml  Output 650 ml  Net 902.3 ml   Filed Weights   10/14/23 0500 10/15/23 0500 10/16/23 0630  Weight: 105.4 kg 100.7 kg 103.1 kg    Physical Examination: General: Middle-age obese male, lying on the bed HEENT: [place]/AT, eyes anicteric.  moist mucus membranes Neuro: Alert, awake following commands, moving all 4 extremities Chest: Coarse breath sounds, no wheezes or rhonchi Heart: Regular rate and rhythm, no murmurs or gallops Abdomen: Soft, nontender, nondistended, bowel sounds present Skin: No rash   Coox 68% Cr 2 Na 129 K 3.8  Resolved  Hospital Problem List:    Assessment & Plan:  Acute on chronic biventricular HFrEF with cardiogenic shock Hypokalemia, improved Hypervolemic hyponatremia Appreciate advanced heart failure team follow-up Initial coox was 35% now it has improved to 68% Currently on dobutamine at 2.5 Patient did not get any diuretic yesterday, made less than 1 L urine, started back on vasopressor support with Levophed, currently at 15 mics Weight  up by 7 pounds since yesterday Restarted back on diuretics Echocardiogram showed EF 20 to 25% with right ventricular dysfunction, severe MR and TR likely functional Holding GDMT in the setting of shock Monitor electrolytes Serum sodium trended down to 129 due to hypervolemia Serum potassium is corrected, currently at 3.8 Continue aggressive potassium replacement  AKI on CKD stage 3a due to cardiorenal syndrome Serum creatinine continue to trend down Monitor intake and output Avoid nephrotoxic agent  EtOH abuse Now in alcohol withdrawal with delirium tremors Alcoholic liver cirrhosis Patient drinks about vent of liquor every day Since he was started on phenobarbital high-dose, his mental status has improved He did not require Ativan Ultrasound liver is suggestive of cirrhosis Counseling provided regarding quit drinking Continue thiamine And follow-up with GI  Anemia and thrombocytopenia due to bone marrow suppression by alcohol Monitor H&H and platelet count Watch for signs of bleeding  Tobacco abuse Smoking cessation counseling provided  Obesity Diet and exercise counseling as appropriate   Best Practice: (right click and "Reselect all SmartList Selections" daily)   Diet/type: Regular consistency DVT prophylaxis:  SQH GI prophylaxis: N/A Lines: N/A Foley:  N/A Code Status:  full code Last date of multidisciplinary goals of care discussion: Per primary team  Labs:  CBC: Recent Labs  Lab 10/12/23 0507 10/12/23 0537 10/13/23 0413  WBC 5.6  --  6.2  HGB 11.1* 12.6* 9.3*  HCT 34.7* 37.0* 28.4*  MCV 107.1*  --  102.9*  PLT 98*  --  82*   Basic Metabolic Panel: Recent Labs  Lab 10/13/23 0413 10/13/23 1852 10/14/23 0342 10/14/23 0759 10/14/23 1357 10/15/23 0615 10/16/23 0407 10/16/23 0755  NA 131*   < > 134* 134* 133* 137 129*  --   K 4.2   < > 3.6 3.7 3.8 3.4* 3.8  --   CL 98   < > 100 101 101 100 96*  --   CO2 20*   < > 22 24 24 25 22   --   GLUCOSE  173*   < > 144* 136* 160* 85 215*  --   BUN 69*   < > 65* 63* 56* 51* 50*  --   CREATININE 3.39*   < > 3.01* 2.71* 2.35* 2.15* 2.01*  --   CALCIUM 8.4*   < > 8.6* 8.6* 8.2* 9.3 8.5*  --   MG 1.9  --  1.8  --   --  2.2  --  2.1  PHOS 5.8*  --   --   --   --   --   --   --    < > = values in this interval not displayed.   GFR: Estimated Creatinine Clearance: 47.8 mL/min (A) (by C-G formula based on SCr of 2.01 mg/dL (H)). Recent Labs  Lab 10/12/23 0507 10/12/23 0536 10/12/23 0652 10/12/23 1016 10/12/23 1353 10/12/23 1409 10/13/23 0413  PROCALCITON  --   --  <0.10  --   --   --   --   WBC 5.6  --   --   --   --   --  6.2  LATICACIDVEN  --  2.0*  --  1.5 1.0 0.8  --    Liver Function Tests: Recent Labs  Lab 10/12/23 0507 10/14/23 0342  AST 41 30  ALT 21 24  ALKPHOS 98 84  BILITOT 1.5* 1.0  PROT 6.8 5.9*  ALBUMIN 3.6 3.1*   No results for input(s): "LIPASE", "AMYLASE" in the last 168 hours. No results for input(s): "AMMONIA" in the last 168 hours.  ABG:    Component Value Date/Time   PHART 7.414 02/27/2011 0756   PCO2ART 35.1 02/27/2011 0756   PO2ART 76.0 (L) 02/27/2011 0756   HCO3 25.5 (H) 02/27/2011 0802   TCO2 21 (L) 10/12/2023 0537   ACIDBASEDEF 1.0 02/27/2011 0756   O2SAT 64.6 10/15/2023 0440   Coagulation Profile: Recent Labs  Lab 10/14/23 0342  INR 1.5*    Cardiac Enzymes: No results for input(s): "CKTOTAL", "CKMB", "CKMBINDEX", "TROPONINI" in the last 168 hours.  HbA1C: Hgb A1c MFr Bld  Date/Time Value Ref Range Status  10/13/2023 04:13 AM 4.4 (L) 4.8 - 5.6 % Final    Comment:    (NOTE) Pre diabetes:          5.7%-6.4%  Diabetes:              >6.4%  Glycemic control for   <7.0% adults with diabetes     CBG: Recent Labs  Lab 10/15/23 1619 10/15/23 2103 10/16/23 0018 10/16/23 0408 10/16/23 0802  GLUCAP 75 114* 171* 244* 130*      The patient is critically ill due to cardiogenic shock/alcohol withdrawal with delirium.  Critical  care was necessary to treat or prevent imminent or life-threatening deterioration.  Critical care was time spent personally by me on the following activities: development of treatment plan with patient and/or surrogate as well as nursing, discussions with consultants, evaluation of patient's response to treatment, examination of patient, obtaining history from patient or surrogate, ordering and performing treatments and interventions, ordering and review of laboratory studies, ordering and review of radiographic studies, pulse oximetry, re-evaluation of patient's condition and participation in multidisciplinary rounds.   During this encounter critical care time was devoted to patient care services described in this note for 34 minutes.     Cheri Fowler, MD [facility] See Amion for pager If no response to pager, please call [phone] until 7pm After 7pm, Please call E-link [phone]

## 2023-10-16 NOTE — Procedures (Signed)
Arterial Line Insertion Start/End10/30/2024 12:55 PM, 10/16/2023 1:12 PM  Patient location: ICU. Preanesthetic checklist: patient identified, risks and benefits discussed, surgical consent, monitors and equipment checked and timeout performed Left, radial was placed Catheter size: 20 G Hand hygiene performed , maximum sterile barriers used  and Seldinger technique used Allen's test indicative of satisfactory collateral circulation Attempts: 1 Procedure performed without using ultrasound guided technique. Following insertion, dressing applied and Biopatch. Post procedure assessment: normal  Patient tolerated the procedure well with no immediate complications.

## 2023-10-16 NOTE — Plan of Care (Signed)
  Problem: Metabolic: Goal: Ability to maintain appropriate glucose levels will improve Outcome: Progressing   

## 2023-10-17 DIAGNOSIS — R570 Cardiogenic shock: Secondary | ICD-10-CM | POA: Diagnosis not present

## 2023-10-17 LAB — BASIC METABOLIC PANEL
Anion gap: 9 (ref 5–15)
BUN: 42 mg/dL — ABNORMAL HIGH (ref 6–20)
CO2: 22 mmol/L (ref 22–32)
Calcium: 8.6 mg/dL — ABNORMAL LOW (ref 8.9–10.3)
Chloride: 98 mmol/L (ref 98–111)
Creatinine, Ser: 1.89 mg/dL — ABNORMAL HIGH (ref 0.61–1.24)
GFR, Estimated: 40 mL/min — ABNORMAL LOW (ref >60)
Glucose, Bld: 86 mg/dL (ref 70–99)
Potassium: 3.9 mmol/L (ref 3.5–5.1)
Sodium: 129 mmol/L — ABNORMAL LOW (ref 135–145)

## 2023-10-17 LAB — GLUCOSE, CAPILLARY
Glucose-Capillary: 101 mg/dL — ABNORMAL HIGH (ref 70–99)
Glucose-Capillary: 105 mg/dL — ABNORMAL HIGH (ref 70–99)
Glucose-Capillary: 134 mg/dL — ABNORMAL HIGH (ref 70–99)
Glucose-Capillary: 90 mg/dL (ref 70–99)
Glucose-Capillary: 95 mg/dL (ref 70–99)
Glucose-Capillary: 96 mg/dL (ref 70–99)

## 2023-10-17 LAB — COOXEMETRY PANEL
Carboxyhemoglobin: 1.6 % — ABNORMAL HIGH (ref 0.5–1.5)
Methemoglobin: <0.7 % (ref 0.0–1.5)
O2 Saturation: 69.5 %
Total hemoglobin: 9.8 g/dL — ABNORMAL LOW (ref 12.0–16.0)

## 2023-10-17 MED ORDER — MELATONIN 5 MG PO TABS
5.0000 mg | ORAL_TABLET | Freq: Every day | ORAL | Status: DC
  Administered 2023-10-17 – 2023-10-25 (×9): 5 mg via ORAL
  Filled 2023-10-17 (×9): qty 1

## 2023-10-17 MED ORDER — POTASSIUM CHLORIDE CRYS ER 20 MEQ PO TBCR
30.0000 meq | EXTENDED_RELEASE_TABLET | Freq: Two times a day (BID) | ORAL | Status: AC
  Administered 2023-10-17 (×2): 30 meq via ORAL
  Filled 2023-10-17 (×2): qty 1

## 2023-10-17 MED ORDER — MENTHOL 3 MG MT LOZG
1.0000 | LOZENGE | OROMUCOSAL | Status: DC | PRN
  Administered 2023-10-17 – 2023-10-25 (×4): 3 mg via ORAL
  Filled 2023-10-17 (×4): qty 9

## 2023-10-17 MED ORDER — FUROSEMIDE 10 MG/ML IJ SOLN
80.0000 mg | Freq: Two times a day (BID) | INTRAMUSCULAR | Status: AC
  Administered 2023-10-17 (×2): 80 mg via INTRAVENOUS
  Filled 2023-10-17 (×2): qty 8

## 2023-10-17 NOTE — Plan of Care (Signed)
  Problem: Education: Goal: Ability to describe self-care measures that may prevent or decrease complications (Diabetes Survival Skills Education) will improve Outcome: Progressing Goal: Individualized Educational Video(s) Outcome: Progressing   Problem: Metabolic: Goal: Ability to maintain appropriate glucose levels will improve Outcome: Progressing   Problem: Nutritional: Goal: Maintenance of adequate nutrition will improve Outcome: Progressing   Problem: Skin Integrity: Goal: Risk for impaired skin integrity will decrease Outcome: Progressing

## 2023-10-17 NOTE — Progress Notes (Addendum)
Advanced Heart Failure Rounding Note  PCP-Cardiologist: None   Subjective:   Echo 10/27 EF 20-25%, LV with GHK, RV mod reduced, mod elevated PASP, LA/RA severely dilated, severe MR/TR 10/29 DBA cut back  to 2.5 mcg. CVP low. Diuretics held.  10/30 hypotensive overnight, NE restarted. A line placed, NE weaned off by midnight  Currently on DBA 2.5  Laying in bed, denies CP/SOB. Wants to ambulate today. Drinking a lot of fluid, discussed watching how much he drinks.   Objective:   Weight Range: 105.6 kg Body mass index is 33.17 kg/m.   Vital Signs:   Temp:  [96.7 F (35.9 C)-98.9 F (37.2 C)] 98.5 F (36.9 C) (10/31 0330) Pulse Rate:  [56-229] 170 (10/31 0545) Resp:  [13-29] 14 (10/31 0545) BP: (79-139)/(63-119) 106/92 (10/30 1245) SpO2:  [83 %-100 %] 99 % (10/31 0545) Arterial Line BP: (91-147)/(35-85) 120/50 (10/31 0545) Weight:  [105.6 kg] 105.6 kg (10/31 0500) Last BM Date : 10/16/23  Weight change: Filed Weights   10/15/23 0500 10/16/23 0630 10/17/23 0500  Weight: 100.7 kg 103.1 kg 105.6 kg    Intake/Output:   Intake/Output Summary (Last 24 hours) at 10/17/2023 0726 Last data filed at 10/17/2023 0600 Gross per 24 hour  Intake 1792.62 ml  Output 2825 ml  Net -1032.38 ml    CVP 15  Physical Exam  General:  well appearing.  No respiratory difficulty HEENT: normal Neck: supple. JVD ~14 cm. Carotids 2+ bilat; no bruits. No lymphadenopathy or thyromegaly appreciated. Cor: PMI nondisplaced. Regular rate & rhythm. No rubs, gallops or murmurs. Lungs: clear Abdomen: soft, nontender, nondistended. No hepatosplenomegaly. No bruits or masses. Good bowel sounds. Extremities: no cyanosis, clubbing, rash, edema. PICC RUE. R radial a line Neuro: alert & oriented x 3, cranial nerves grossly intact. moves all 4 extremities w/o difficulty. Affect pleasant.   Telemetry   ST-ST frequent PVCs (Personally reviewed)    Patient Profile   57 year old male with known  history of CKD stage IIIa, pulmonary hypertension, and alcohol related dilated cardiomyopathy with chronic combined diastolic and systolic congestive heart failure who presents in cardiogenic shock.   Assessment/Plan  1. Cardiogenic shock: Patient presented in cardiogenic shock, hypotensive with mildly elevated lactate.  He was placed on norepinephrine and subsequently dobutamine with improvement in his lactate.  His Coox was 35% which gives a cardiac index of 1.5 by assumed Fick.  Given ongoing heavy alcohol use, potential cirrhosis, and previous lost to follow-up would potentially consider MCS but only as a bridge to recovery, likely does not have any durable options. - NE now off, currently on DBA 2.5 mcg. Lactic acid 1.3. Wean DBA to 1, plan to d/c by end of day if he diureses well - CVP 15. Weight up 5lbs. Give 80 IV lasix BID. Follow response, may need diamox again -Holding GDMT with hypotensive cardiogenic shock -Poor advanced therapies candidate -Place fluid restriction -Repeat echo 10/27 EF 20-25%, LV with GHK, RV mod reduced, mod elevated PASP, LA/RA severely dilated, severe MR/TR   2. AKI on CKD: Baseline likely around 1.6-1.9 based on outside labs, elevated to 3.7 on arrival.   - Likely secondary to cardiorenal syndrome. - 1.9 today - avoid hypotension   3. Alcohol abuse: Drinks at least a pint of liquor every day, has not gone without alcohol in many years.  Denies withdrawal symptoms in the past. -Continue CIWA protocol, now on phenobarbital, no ativan overnight  -Thiamine/folate  4. ?Cirrhosis: Based on soft BP, years  of heavy alcohol use, and evidence of synthetic dysfunction (platelets, elevated bilirubin) is likely a possibility. PT-INR 18/1.5, can consider abdominal imaging when more stable. - MELD labs ordered - Abdominal imaging when more stable - If taking for a RHC, would obtain hepatic wedge pressure gradient   Length of Stay: 5  Alen Bleacher, NP  10/17/2023, 7:26  AM  Advanced Heart Failure Team Pager [phone] (M-F; 7a - 5p)  Please contact [facility] for night-coverage after hours (5p -7a ) and weekends on [email or web address]

## 2023-10-18 ENCOUNTER — Encounter (HOSPITAL_COMMUNITY)
Admission: EM | Disposition: A | Discharge status: Hospice | Payer: Self-pay | Source: Home / Self Care | Attending: Cardiology

## 2023-10-18 DIAGNOSIS — R570 Cardiogenic shock: Secondary | ICD-10-CM | POA: Diagnosis not present

## 2023-10-18 HISTORY — PX: RIGHT HEART CATH: CATH118263

## 2023-10-18 LAB — BASIC METABOLIC PANEL
Anion gap: 8 (ref 5–15)
BUN: 43 mg/dL — ABNORMAL HIGH (ref 6–20)
CO2: 21 mmol/L — ABNORMAL LOW (ref 22–32)
Calcium: 8.9 mg/dL (ref 8.9–10.3)
Chloride: 99 mmol/L (ref 98–111)
Creatinine, Ser: 2.07 mg/dL — ABNORMAL HIGH (ref 0.61–1.24)
GFR, Estimated: 36 mL/min — ABNORMAL LOW (ref >60)
Glucose, Bld: 97 mg/dL (ref 70–99)
Potassium: 5 mmol/L (ref 3.5–5.1)
Sodium: 128 mmol/L — ABNORMAL LOW (ref 135–145)

## 2023-10-18 LAB — COOXEMETRY PANEL
Carboxyhemoglobin: 0.8 % (ref 0.5–1.5)
Carboxyhemoglobin: 1.2 % (ref 0.5–1.5)
Carboxyhemoglobin: 2.1 % — ABNORMAL HIGH (ref 0.5–1.5)
Methemoglobin: <0.7 % (ref 0.0–1.5)
Methemoglobin: <0.7 % (ref 0.0–1.5)
Methemoglobin: <0.7 % (ref 0.0–1.5)
O2 Saturation: 42.7 %
O2 Saturation: 43.7 %
O2 Saturation: 62.3 %
Total hemoglobin: 10.7 g/dL — ABNORMAL LOW (ref 12.0–16.0)
Total hemoglobin: 10.7 g/dL — ABNORMAL LOW (ref 12.0–16.0)
Total hemoglobin: 10.1 g/dL — ABNORMAL LOW (ref 12.0–16.0)

## 2023-10-18 LAB — POCT I-STAT EG7
Acid-base deficit: 4 mmol/L — ABNORMAL HIGH (ref 0.0–2.0)
Acid-base deficit: 5 mmol/L — ABNORMAL HIGH (ref 0.0–2.0)
Bicarbonate: 20.5 mmol/L (ref 20.0–28.0)
Bicarbonate: 21.3 mmol/L (ref 20.0–28.0)
Calcium, Ion: 1.2 mmol/L (ref 1.15–1.40)
Calcium, Ion: 1.21 mmol/L (ref 1.15–1.40)
HCT: 35 % — ABNORMAL LOW (ref 39.0–52.0)
HCT: 35 % — ABNORMAL LOW (ref 39.0–52.0)
Hemoglobin: 11.9 g/dL — ABNORMAL LOW (ref 13.0–17.0)
Hemoglobin: 11.9 g/dL — ABNORMAL LOW (ref 13.0–17.0)
O2 Saturation: 50 %
O2 Saturation: 54 %
Potassium: 4.5 mmol/L (ref 3.5–5.1)
Potassium: 4.6 mmol/L (ref 3.5–5.1)
Sodium: 129 mmol/L — ABNORMAL LOW (ref 135–145)
Sodium: 130 mmol/L — ABNORMAL LOW (ref 135–145)
TCO2: 22 mmol/L (ref 22–32)
TCO2: 22 mmol/L (ref 22–32)
pCO2, Ven: 36.7 mm[Hg] — ABNORMAL LOW (ref 44–60)
pCO2, Ven: 37.9 mm[Hg] — ABNORMAL LOW (ref 44–60)
pH, Ven: 7.354 (ref 7.25–7.43)
pH, Ven: 7.359 (ref 7.25–7.43)
pO2, Ven: 28 mm[Hg] — CL (ref 32–45)
pO2, Ven: 30 mm[Hg] — CL (ref 32–45)

## 2023-10-18 LAB — CBC
HCT: 31.2 % — ABNORMAL LOW (ref 39.0–52.0)
Hemoglobin: 10.1 g/dL — ABNORMAL LOW (ref 13.0–17.0)
MCH: 33 pg (ref 26.0–34.0)
MCHC: 32.4 g/dL (ref 30.0–36.0)
MCV: 102 fL — ABNORMAL HIGH (ref 80.0–100.0)
Platelets: 81 10*3/uL — ABNORMAL LOW (ref 150–400)
RBC: 3.06 MIL/uL — ABNORMAL LOW (ref 4.22–5.81)
RDW: 13.6 % (ref 11.5–15.5)
WBC: 5.6 10*3/uL (ref 4.0–10.5)
nRBC: 0 % (ref 0.0–0.2)

## 2023-10-18 LAB — GLUCOSE, CAPILLARY
Glucose-Capillary: 120 mg/dL — ABNORMAL HIGH (ref 70–99)
Glucose-Capillary: 110 mg/dL — ABNORMAL HIGH (ref 70–99)
Glucose-Capillary: 79 mg/dL (ref 70–99)
Glucose-Capillary: 109 mg/dL — ABNORMAL HIGH (ref 70–99)
Glucose-Capillary: 78 mg/dL (ref 70–99)

## 2023-10-18 LAB — LACTIC ACID, PLASMA: Lactic Acid, Venous: 1.4 mmol/L (ref 0.5–1.9)

## 2023-10-18 LAB — MAGNESIUM: Magnesium: 2.4 mg/dL (ref 1.7–2.4)

## 2023-10-18 SURGERY — RIGHT HEART CATH
Anesthesia: LOCAL

## 2023-10-18 MED ORDER — FENTANYL CITRATE (PF) 100 MCG/2ML IJ SOLN
INTRAMUSCULAR | Status: AC
  Filled 2023-10-18: qty 2

## 2023-10-18 MED ORDER — LIDOCAINE HCL (PF) 1 % IJ SOLN
INTRAMUSCULAR | Status: DC | PRN
  Administered 2023-10-18: 2 mL

## 2023-10-18 MED ORDER — MIDAZOLAM HCL 2 MG/2ML IJ SOLN
INTRAMUSCULAR | Status: AC
  Filled 2023-10-18: qty 2

## 2023-10-18 MED ORDER — SODIUM CHLORIDE 0.9 % IV SOLN: INTRAVENOUS | Status: DC

## 2023-10-18 MED ORDER — FUROSEMIDE 10 MG/ML IJ SOLN
80.0000 mg | Freq: Once | INTRAMUSCULAR | Status: AC
  Administered 2023-10-18: 80 mg via INTRAVENOUS
  Filled 2023-10-18: qty 8

## 2023-10-18 MED ORDER — LIDOCAINE HCL (PF) 1 % IJ SOLN
INTRAMUSCULAR | Status: AC
Start: 2023-10-18 — End: ?
  Filled 2023-10-18: qty 30

## 2023-10-18 MED ORDER — ACETAZOLAMIDE SODIUM 500 MG IJ SOLR
500.0000 mg | Freq: Once | INTRAMUSCULAR | Status: AC
  Administered 2023-10-18: 500 mg via INTRAVENOUS
  Filled 2023-10-18: qty 500

## 2023-10-18 MED ORDER — METOLAZONE 2.5 MG PO TABS
2.5000 mg | ORAL_TABLET | Freq: Once | ORAL | Status: AC
  Administered 2023-10-18: 2.5 mg via ORAL
  Filled 2023-10-18: qty 1

## 2023-10-18 MED ORDER — HEPARIN (PORCINE) IN NACL 1000-0.9 UT/500ML-% IV SOLN
INTRAVENOUS | Status: DC | PRN
  Administered 2023-10-18: 500 mL

## 2023-10-18 MED ORDER — INSULIN ASPART 100 UNIT/ML IJ SOLN: 0.0000 [IU] | Freq: Three times a day (TID) | INTRAMUSCULAR | Status: DC

## 2023-10-18 MED ORDER — INSULIN ASPART 100 UNIT/ML IJ SOLN: 0.0000 [IU] | Freq: Every day | INTRAMUSCULAR | Status: DC

## 2023-10-18 MED ORDER — SODIUM CHLORIDE 0.9 % IV SOLN
INTRAVENOUS | Status: DC
Start: 2023-10-18 — End: 2023-10-18

## 2023-10-18 MED ORDER — MIDAZOLAM HCL 2 MG/2ML IJ SOLN
INTRAMUSCULAR | Status: DC | PRN
  Administered 2023-10-18: 1 mg via INTRAVENOUS

## 2023-10-18 MED ORDER — FENTANYL CITRATE (PF) 100 MCG/2ML IJ SOLN
INTRAMUSCULAR | Status: DC | PRN
  Administered 2023-10-18: 25 ug via INTRAVENOUS

## 2023-10-18 SURGICAL SUPPLY — 6 items
CATH SWAN GANZ 7F STRAIGHT (CATHETERS) IMPLANT
KIT MICROPUNCTURE NIT STIFF (SHEATH) IMPLANT
PACK CARDIAC CATHETERIZATION (CUSTOM PROCEDURE TRAY) ×2 IMPLANT
SHEATH PINNACLE 7F 10CM (SHEATH) IMPLANT
SHEATH PROBE COVER 6X72 (BAG) IMPLANT
TRANSDUCER W/STOPCOCK (MISCELLANEOUS) IMPLANT

## 2023-10-18 NOTE — Progress Notes (Addendum)
Advanced Heart Failure Rounding Note  PCP-Cardiologist: None   Subjective:   Echo 10/27 EF 20-25%, LV with GHK, RV mod reduced, mod elevated PASP, LA/RA severely dilated, severe MR/TR 10/29 DBA cut back  to 2.5 mcg. CVP low. Diuretics held.  10/30 hypotensive overnight, NE restarted. A line placed, NE weaned off by midnight  Currently on DBA 1  Only UOP documented yesterday +1 urine occurrence. I&O inaccurate. Weight down 4 lbs.   Laying in bed. NPO for cath today. CVP 20. Wants to get up and walk later today.   Objective:   Weight Range: 103.7 kg Body mass index is 32.57 kg/m.   Vital Signs:   Temp:  [97.5 F (36.4 C)-98.7 F (37.1 C)] 97.6 F (36.4 C) (11/01 0804) Pulse Rate:  [98-112] 98 (11/01 0804) Resp:  [15-22] 15 (11/01 0804) BP: (85-105)/(55-84) 94/76 (11/01 0804) SpO2:  [98 %-100 %] 100 % (11/01 0804) Arterial Line BP: (92)/(57-62) 92/62 (10/31 1100) Weight:  [103.7 kg] 103.7 kg (11/01 0409) Last BM Date : 10/18/23  Weight change: Filed Weights   10/16/23 0630 10/17/23 0500 10/18/23 0409  Weight: 103.1 kg 105.6 kg 103.7 kg    Intake/Output:   Intake/Output Summary (Last 24 hours) at 10/18/2023 0900 Last data filed at 10/18/2023 0700 Gross per 24 hour  Intake 515.32 ml  Output 250 ml  Net 265.32 ml    CVP 20  Physical Exam  General:  well appearing.  No respiratory difficulty HEENT: normal Neck: supple. JVD to jaw. Carotids 2+ bilat; no bruits. No lymphadenopathy or thyromegaly appreciated. Cor: PMI nondisplaced. Regular rate & rhythm. No rubs, gallops or murmurs. Lungs: clear Abdomen: soft, nontender, distended. No hepatosplenomegaly. No bruits or masses. Good bowel sounds. Extremities: no cyanosis, clubbing, rash, trace BLE edema. PICC RUE Neuro: alert & oriented x 3, cranial nerves grossly intact. moves all 4 extremities w/o difficulty. Affect pleasant.   Telemetry   ST low 100s (Personally reviewed)    Patient Profile   57 year  old male with known history of CKD stage IIIa, pulmonary hypertension, and alcohol related dilated cardiomyopathy with chronic combined diastolic and systolic congestive heart failure who presents in cardiogenic shock.   Assessment/Plan  1. Cardiogenic shock: Patient presented in cardiogenic shock, hypotensive with mildly elevated lactate.  He was placed on norepinephrine and subsequently dobutamine with improvement in his lactate.  His Coox was 35% which gives a cardiac index of 1.5 by assumed Fick.  Given ongoing heavy alcohol use, potential cirrhosis, and previous lost to follow-up would potentially consider MCS but only as a bridge to recovery, likely does not have any durable options. - Stable off NE. Increase DBA 1>3. BP soft. May need ICU again but hopefully we can avoid that -CVP 20. Weight down 4lbs. Give 80 IV lasix and diamox 500 mg x1. Will reevaluate diuretics post cath.  -Holding GDMT with hypotensive cardiogenic shock -Poor advanced therapies candidate -Continue fluid restriction -Repeat echo 10/27 EF 20-25%, LV with GHK, RV mod reduced, mod elevated PASP, LA/RA severely dilated, severe MR/TR - Patient getting RHC today, consider obtaining hepatic wedge pressure gradient.  Cath consent: The patient understands that risks included but are not limited to stroke (1 in 1000), death (1 in 1000), kidney failure [usually temporary] (1 in 500), bleeding (1 in 200), allergic reaction [possibly serious] (1 in 200).  The patient understands and agrees to proceed.     2. AKI on CKD: Baseline likely around 1.6-1.9 based on outside labs,  elevated to 3.7 on arrival.   - Likely secondary to cardiorenal syndrome. - 2.07 today - avoid hypotension   3. Alcohol abuse: Drinks at least a pint of liquor every day, has not gone without alcohol in many years.  Denies withdrawal symptoms in the past. -Continue CIWA protocol, now on phenobarbital, 1mg  ativan overnight -Thiamine/folate  4. ?Cirrhosis:  Based on soft BP, years of heavy alcohol use, and evidence of synthetic dysfunction (platelets, elevated bilirubin) is likely a possibility. PT-INR 18/1.5, can consider abdominal imaging when more stable. - MELD labs ordered - Abdominal imaging when more stable   Length of Stay: 6  Alen Bleacher, NP  10/18/2023, 9:00 AM  Advanced Heart Failure Team Pager [phone] (M-F; 7a - 5p)  Please contact [facility] for night-coverage after hours (5p -7a ) and weekends on [email or web address]

## 2023-10-18 NOTE — H&P (View-Only) (Signed)
Advanced Heart Failure Rounding Note  PCP-Cardiologist: None   Subjective:   Echo 10/27 EF 20-25%, LV with GHK, RV mod reduced, mod elevated PASP, LA/RA severely dilated, severe MR/TR 10/29 DBA cut back  to 2.5 mcg. CVP low. Diuretics held.  10/30 hypotensive overnight, NE restarted. A line placed, NE weaned off by midnight  Currently on DBA 1  Only UOP documented yesterday +1 urine occurrence. I&O inaccurate. Weight down 4 lbs.   Laying in bed. NPO for cath today. CVP 20. Wants to get up and walk later today.   Objective:   Weight Range: 103.7 kg Body mass index is 32.57 kg/m.   Vital Signs:   Temp:  [97.5 F (36.4 C)-98.7 F (37.1 C)] 97.6 F (36.4 C) (11/01 0804) Pulse Rate:  [98-112] 98 (11/01 0804) Resp:  [15-22] 15 (11/01 0804) BP: (85-105)/(55-84) 94/76 (11/01 0804) SpO2:  [98 %-100 %] 100 % (11/01 0804) Arterial Line BP: (92)/(57-62) 92/62 (10/31 1100) Weight:  [103.7 kg] 103.7 kg (11/01 0409) Last BM Date : 10/18/23  Weight change: Filed Weights   10/16/23 0630 10/17/23 0500 10/18/23 0409  Weight: 103.1 kg 105.6 kg 103.7 kg    Intake/Output:   Intake/Output Summary (Last 24 hours) at 10/18/2023 0900 Last data filed at 10/18/2023 0700 Gross per 24 hour  Intake 515.32 ml  Output 250 ml  Net 265.32 ml    CVP 20  Physical Exam  General:  well appearing.  No respiratory difficulty HEENT: normal Neck: supple. JVD to jaw. Carotids 2+ bilat; no bruits. No lymphadenopathy or thyromegaly appreciated. Cor: PMI nondisplaced. Regular rate & rhythm. No rubs, gallops or murmurs. Lungs: clear Abdomen: soft, nontender, distended. No hepatosplenomegaly. No bruits or masses. Good bowel sounds. Extremities: no cyanosis, clubbing, rash, trace BLE edema. PICC RUE Neuro: alert & oriented x 3, cranial nerves grossly intact. moves all 4 extremities w/o difficulty. Affect pleasant.   Telemetry   ST low 100s (Personally reviewed)    Patient Profile   57 year  old male with known history of CKD stage IIIa, pulmonary hypertension, and alcohol related dilated cardiomyopathy with chronic combined diastolic and systolic congestive heart failure who presents in cardiogenic shock.   Assessment/Plan  1. Cardiogenic shock: Patient presented in cardiogenic shock, hypotensive with mildly elevated lactate.  He was placed on norepinephrine and subsequently dobutamine with improvement in his lactate.  His Coox was 35% which gives a cardiac index of 1.5 by assumed Fick.  Given ongoing heavy alcohol use, potential cirrhosis, and previous lost to follow-up would potentially consider MCS but only as a bridge to recovery, likely does not have any durable options. - Stable off NE. Increase DBA 1>3. BP soft. May need ICU again but hopefully we can avoid that -CVP 20. Weight down 4lbs. Give 80 IV lasix and diamox 500 mg x1. Will reevaluate diuretics post cath.  -Holding GDMT with hypotensive cardiogenic shock -Poor advanced therapies candidate -Continue fluid restriction -Repeat echo 10/27 EF 20-25%, LV with GHK, RV mod reduced, mod elevated PASP, LA/RA severely dilated, severe MR/TR - Patient getting RHC today, consider obtaining hepatic wedge pressure gradient.  Cath consent: The patient understands that risks included but are not limited to stroke (1 in 1000), death (1 in 1000), kidney failure [usually temporary] (1 in 500), bleeding (1 in 200), allergic reaction [possibly serious] (1 in 200).  The patient understands and agrees to proceed.     2. AKI on CKD: Baseline likely around 1.6-1.9 based on outside labs,  elevated to 3.7 on arrival.   - Likely secondary to cardiorenal syndrome. - 2.07 today - avoid hypotension   3. Alcohol abuse: Drinks at least a pint of liquor every day, has not gone without alcohol in many years.  Denies withdrawal symptoms in the past. -Continue CIWA protocol, now on phenobarbital, 1mg  ativan overnight -Thiamine/folate  4. ?Cirrhosis:  Based on soft BP, years of heavy alcohol use, and evidence of synthetic dysfunction (platelets, elevated bilirubin) is likely a possibility. PT-INR 18/1.5, can consider abdominal imaging when more stable. - MELD labs ordered - Abdominal imaging when more stable   Length of Stay: 6  Alen Bleacher, NP  10/18/2023, 9:00 AM  Advanced Heart Failure Team Pager [phone] (M-F; 7a - 5p)  Please contact [facility] for night-coverage after hours (5p -7a ) and weekends on [email or web address]

## 2023-10-18 NOTE — Interval H&P Note (Signed)
History and Physical Interval Note:  10/18/2023 1:07 PM  [name]  has presented today for surgery, with the diagnosis of heart failure.  The various methods of treatment have been discussed with the patient and family. After consideration of risks, benefits and other options for treatment, the patient has consented to  Procedure(s): RIGHT HEART CATH (N/A) as a surgical intervention.  [name] history has been reviewed, patient examined, no change in status, stable for surgery.  I have reviewed the patient's chart and labs.  Questions were answered to the patient's satisfaction.     Christopher [name]

## 2023-10-18 NOTE — Evaluation (Signed)
Physical Therapy Evaluation Patient Details Name: Christopher [name] MRN: [id number] DOB: [birth year] Today's Date: 10/18/2023  History of Present Illness  Pt is a 57 y.o. male who presented 10/12/23 with gradually worsening SOB x1 month. Admitted with cardiogenic shock. S/p RHC 11/1. PMH: CKD stage IIIa, pulmonary hypertension, and alcohol related dilated cardiomyopathy with chronic combined diastolic and systolic congestive heart failure, HTN, gout, EtOH use, some tobacco use   Clinical Impression  Pt presents with condition above and deficits mentioned below, see PT Problem List. PTA, he was independent without DME, working, driving, and living alone in a 1-level apartment with x3 flights of stairs to access. He has limited support available from his 27 y.o. father. Currently, pt displays deficits in cognition, balance, activity tolerance, and strength. He is requiring up to modA to transition supine to sit EOB, minA for transfers, and min-modA to ambulate up to ~60 ft with a RW this date. He is at risk for falls and has poor safety awareness. He is limited by his lethargy and confusion this date though. If pt makes good, quick progress then he may be able to go home with HHPT. However, pt is not at a level where he is safe to d/c home alone at this time. Thus, currently recommending short-term inpatient rehab, < 3 hours/day. Will continue to follow acutely. See General Comments below in regards to HR and BP abnormalities this session.      If plan is discharge home, recommend the following: A little help with walking and/or transfers;A little help with bathing/dressing/bathroom;Assistance with cooking/housework;Direct supervision/assist for medications management;Direct supervision/assist for financial management;Assist for transportation;Help with stairs or ramp for entrance   Can travel by private vehicle   Yes    Equipment Recommendations Rolling walker (2 wheels);BSC/3in1  Recommendations for  Other Services  OT consult    Functional Status Assessment Patient has had a recent decline in their functional status and demonstrates the ability to make significant improvements in function in a reasonable and predictable amount of time.     Precautions / Restrictions Precautions Precautions: Fall Precaution Comments: watch HR and BP Restrictions Weight Bearing Restrictions: No      Mobility  Bed Mobility Overal bed mobility: Needs Assistance Bed Mobility: Supine to Sit, Sit to Supine     Supine to sit: Mod assist, HOB elevated Sit to supine: Contact guard assist, HOB elevated   General bed mobility comments: Pt brought legs off R EOB when cued to sit up and pt stated "how is that?" with his trunk still flat on the bed. Pt needed modA to pull his trunk up to sit. CGA for return to supine    Transfers Overall transfer level: Needs assistance Equipment used: Rolling walker (2 wheels) Transfers: Sit to/from Stand Sit to Stand: Min assist           General transfer comment: MinA for balance when transferring to stand from EOB, pt displayed a posterior sway    Ambulation/Gait Ambulation/Gait assistance: Min assist, Mod assist Gait Distance (Feet): 60 Feet Assistive device: Rolling walker (2 wheels) Gait Pattern/deviations: Step-through pattern, Decreased stride length, Staggering right, Staggering left Gait velocity: reduced Gait velocity interpretation: <1.31 ft/sec, indicative of household ambulator   General Gait Details: Pt ambulated slowly and unsteadily, staggering laterally intermittently. Pt required up to minA for balance but modA to direct his RW towards the end.  Stairs            Wheelchair Mobility     Tilt Bed  Modified Rankin (Stroke Patients Only)       Balance Overall balance assessment: Needs assistance Sitting-balance support: No upper extremity supported, Feet supported Sitting balance-Leahy Scale: Fair Sitting balance -  Comments: Able to sit statically EOB with CGA, did display x1 LOB to R with minA to recover   Standing balance support: Bilateral upper extremity supported, During functional activity, Reliant on assistive device for balance, No upper extremity supported Standing balance-Leahy Scale: Fair Standing balance comment: Able to stand statically without UE support, but mild sway noted. Benefits from RW and up to minA to prevent LOB when ambulating                             Pertinent Vitals/Pain Pain Assessment Pain Assessment: Faces Faces Pain Scale: No hurt Pain Intervention(s): Monitored during session    Home Living Family/patient expects to be discharged to:: Private residence Living Arrangements: Alone Available Help at Discharge: Family;Available PRN/intermittently (98 y.o. father, who pt reports is healthy enough to assist pt but not 24/7) Type of Home: Apartment Home Access: Stairs to enter Entrance Stairs-Rails: Left Entrance Stairs-Number of Steps: x3 flights   Home Layout: One level Home Equipment: None      Prior Function Prior Level of Function : Independent/Modified Independent;Driving;Working/employed               ADLs Comments: Works in Programme researcher, broadcasting/film/video at [employer]     Extremity/Trunk Assessment   Upper Extremity Assessment Upper Extremity Assessment: Defer to OT evaluation    Lower Extremity Assessment Lower Extremity Assessment: Generalized weakness (denied numbness/tingling bil)    Cervical / Trunk Assessment Cervical / Trunk Assessment: Normal  Communication   Communication Communication: No apparent difficulties  Cognition Arousal: Alert, Lethargic Behavior During Therapy: Flat affect Overall Cognitive Status: Impaired/Different from baseline Area of Impairment: Orientation, Attention, Memory, Following commands, Safety/judgement, Awareness, Problem solving                 Orientation Level: Disoriented to, Time Current Attention  Level: Sustained Memory: Decreased short-term memory Following Commands: Follows one step commands consistently, Follows one step commands with increased time Safety/Judgement: Decreased awareness of safety, Decreased awareness of deficits Awareness: Emergent Problem Solving: Slow processing, Difficulty sequencing, Requires verbal cues General Comments: Pt lethargic, requesting to return to bed often but able to remain awake to ambulate for session. Pt oriented to location, situation, and self but thinks it is "April 1924". Slow to process cues and poor awareness of his lines and safety, often moving arms while RN is trying to draw blood etc.        General Comments General comments (skin integrity, edema, etc.): Before pt began to mobilize with PT his HR monitor alarmed VTach with x5 beat run noted on monitor, pt was supine and resting, pt declined chest pain or lightheadedness/dizziness, RN notified immediately who cleared pt to still attempt session; BP 89/77 (83) sitting initially, 99/80 (87) sitting several minutes, 93/74 (80) standing, 80/69 (75) supine end of session, pt's BP noted to be soft earlier today as well, pt only confirmed lightheadedness when standing    Exercises     Assessment/Plan    PT Assessment Patient needs continued PT services  PT Problem List Decreased strength;Decreased activity tolerance;Decreased balance;Decreased mobility;Decreased cognition;Cardiopulmonary status limiting activity       PT Treatment Interventions DME instruction;Gait training;Stair training;Functional mobility training;Therapeutic activities;Therapeutic exercise;Balance training;Neuromuscular re-education;Cognitive remediation;Patient/family education    PT Goals (Current goals can be  found in the Care Plan section)  Acute Rehab PT Goals Patient Stated Goal: to improve PT Goal Formulation: With patient Time For Goal Achievement: 11/01/23 Potential to Achieve Goals: Good    Frequency  Min 1X/week     Co-evaluation               AM-PAC PT "6 Clicks" Mobility  Outcome Measure Help needed turning from your back to your side while in a flat bed without using bedrails?: A Little Help needed moving from lying on your back to sitting on the side of a flat bed without using bedrails?: A Lot Help needed moving to and from a bed to a chair (including a wheelchair)?: A Little Help needed standing up from a chair using your arms (e.g., wheelchair or bedside chair)?: A Little Help needed to walk in hospital room?: A Lot Help needed climbing 3-5 steps with a railing? : Total 6 Click Score: 14    End of Session Equipment Utilized During Treatment: Gait belt Activity Tolerance: Patient tolerated treatment well Patient left: in bed;with call bell/phone within reach;with bed alarm set Nurse Communication: Mobility status;Other (comment) (HR and BP) PT Visit Diagnosis: Unsteadiness on feet (R26.81);Other abnormalities of gait and mobility (R26.89);Muscle weakness (generalized) (M62.81);Difficulty in walking, not elsewhere classified (R26.2)    Time: 1610-9604 PT Time Calculation (min) (ACUTE ONLY): 28 min   Charges:   PT Evaluation $PT Eval Moderate Complexity: 1 Mod PT Treatments $Therapeutic Activity: 8-22 mins PT General Charges $$ ACUTE PT VISIT: 1 Visit         Virgil Benedict, PT, DPT Acute Rehabilitation Services  Office: [phone]   Bettina Gavia 10/18/2023, 4:52 PM

## 2023-10-18 NOTE — Progress Notes (Signed)
RHC today: Elevated filling pressures, Fick CI 2, TD CI 1.8, significant RV dysfunction with PAPi 1.5.  Increase DBA to 5 mcg/kg/min  Give another 80 mg lasix IV and 2.5 mg metolazone.   Recheck CO-OX this afternoon.

## 2023-10-18 NOTE — Plan of Care (Signed)
  Problem: Education: Goal: Knowledge of General Education information will improve Description: Including pain rating scale, medication(s)/side effects and non-pharmacologic comfort measures Outcome: Not Progressing   

## 2023-10-19 DIAGNOSIS — R570 Cardiogenic shock: Secondary | ICD-10-CM | POA: Diagnosis not present

## 2023-10-19 LAB — CBC
HCT: 29.1 % — ABNORMAL LOW (ref 39.0–52.0)
Hemoglobin: 9.3 g/dL — ABNORMAL LOW (ref 13.0–17.0)
MCH: 32.7 pg (ref 26.0–34.0)
MCHC: 32 g/dL (ref 30.0–36.0)
MCV: 102.5 fL — ABNORMAL HIGH (ref 80.0–100.0)
Platelets: 76 10*3/uL — ABNORMAL LOW (ref 150–400)
RBC: 2.84 MIL/uL — ABNORMAL LOW (ref 4.22–5.81)
RDW: 13.5 % (ref 11.5–15.5)
WBC: 5.7 10*3/uL (ref 4.0–10.5)
nRBC: 0 % (ref 0.0–0.2)

## 2023-10-19 LAB — COOXEMETRY PANEL
Carboxyhemoglobin: 1.9 % — ABNORMAL HIGH (ref 0.5–1.5)
Methemoglobin: 0.7 % (ref 0.0–1.5)
O2 Saturation: 72.4 %
Total hemoglobin: 10.2 g/dL — ABNORMAL LOW (ref 12.0–16.0)

## 2023-10-19 LAB — BASIC METABOLIC PANEL
Anion gap: 17 — ABNORMAL HIGH (ref 5–15)
BUN: 48 mg/dL — ABNORMAL HIGH (ref 6–20)
CO2: 16 mmol/L — ABNORMAL LOW (ref 22–32)
Calcium: 9.1 mg/dL (ref 8.9–10.3)
Chloride: 95 mmol/L — ABNORMAL LOW (ref 98–111)
Creatinine, Ser: 2.19 mg/dL — ABNORMAL HIGH (ref 0.61–1.24)
GFR, Estimated: 34 mL/min — ABNORMAL LOW (ref >60)
Glucose, Bld: 96 mg/dL (ref 70–99)
Potassium: 4.1 mmol/L (ref 3.5–5.1)
Sodium: 128 mmol/L — ABNORMAL LOW (ref 135–145)

## 2023-10-19 LAB — GLUCOSE, CAPILLARY
Glucose-Capillary: 101 mg/dL — ABNORMAL HIGH (ref 70–99)
Glucose-Capillary: 95 mg/dL (ref 70–99)
Glucose-Capillary: 82 mg/dL (ref 70–99)
Glucose-Capillary: 137 mg/dL — ABNORMAL HIGH (ref 70–99)

## 2023-10-19 MED ORDER — ALTEPLASE 2 MG IJ SOLR
2.0000 mg | Freq: Once | INTRAMUSCULAR | Status: AC
  Administered 2023-10-19: 2 mg

## 2023-10-19 MED ORDER — ALTEPLASE 2 MG IJ SOLR: 2.0000 mg | Freq: Once | INTRAMUSCULAR | Status: DC

## 2023-10-19 MED ORDER — ALTEPLASE 2 MG IJ SOLR
2.0000 mg | Freq: Once | INTRAMUSCULAR | Status: AC
  Administered 2023-10-19: 2 mg
  Filled 2023-10-19 (×3): qty 2

## 2023-10-19 MED ORDER — NOREPINEPHRINE 4 MG/250ML-% IV SOLN
0.0000 ug/min | INTRAVENOUS | Status: DC
  Administered 2023-10-19: 2 ug/min via INTRAVENOUS
  Administered 2023-10-20: 5 ug/min via INTRAVENOUS
  Administered 2023-10-20: 3 ug/min via INTRAVENOUS
  Filled 2023-10-19 (×3): qty 250

## 2023-10-19 NOTE — Significant Event (Signed)
10 beat Run of Vtach Transferring to ICU

## 2023-10-19 NOTE — Progress Notes (Addendum)
Patient is alert and oriented with Low Blood pressures up and down over night. Night time MD was made aware. Baseline Blood pressures 90s/60s with MAPs of 70s. Resently b/p dropping into 75/60s, Manually 80/60s. AHF Dr. Dory Peru. Asessed and aware.

## 2023-10-19 NOTE — Progress Notes (Addendum)
Advanced Heart Failure Rounding Note  PCP-Cardiologist: None   Subjective:   Echo 10/27 EF 20-25%, LV with GHK, RV mod reduced, mod elevated PASP, LA/RA severely dilated, severe MR/TR 10/29 DBA cut back  to 2.5 mcg. CVP low. Diuretics held.  10/30 hypotensive overnight, NE restarted. A line placed, NE weaned off by midnight  Currently on DBA 5  - RHC 11/1 with R>L failure RA 17 PA 55/28 (39) PCWP 18 Thermo 4.1/1.8 PAPi 1.5   SBP running in 70-80 range throughout am with occasional high 60s (rounding team not notified). Patient asymptomatic. Denies CP or SOB.   CVP 19-20    Objective:   Weight Range: 101.2 kg Body mass index is 31.78 kg/m.   Vital Signs:   Temp:  [97.4 F (36.3 C)-98.4 F (36.9 C)] 97.5 F (36.4 C) (11/02 1300) Pulse Rate:  [79-112] 89 (11/02 1154) Resp:  [15-18] 17 (11/02 1400) BP: (75-99)/(59-80) 75/60 (11/02 1410) SpO2:  [95 %-100 %] 100 % (11/02 1410) Weight:  [101.2 kg] 101.2 kg (11/02 0500) Last BM Date : 10/19/23  Weight change: Filed Weights   10/18/23 0409 10/18/23 0900 10/19/23 0500  Weight: 103.7 kg 101.7 kg 101.2 kg    Intake/Output:   Intake/Output Summary (Last 24 hours) at 10/19/2023 1445 Last data filed at 10/19/2023 1150 Gross per 24 hour  Intake 633.33 ml  Output 1450 ml  Net -816.67 ml    Physical Exam   General:  Lying flat in bed No resp difficulty HEENT: normal Neck: supple. JVP to ear  Carotids 2+ bilat; no bruits. No lymphadenopathy or thryomegaly appreciated. Cor: Regular rtachy. +s3 Lungs: clear Abdomen: soft, nontender, nondistended. No hepatosplenomegaly. No bruits or masses. Good bowel sounds. Extremities: no cyanosis, clubbing, rash, 1+ edema Neuro: alert & orientedx3, cranial nerves grossly intact. moves all 4 extremities w/o difficulty. Affect pleasant   Telemetry   Sinus tach 100-120 Personally reviewed  Patient Profile   57 year old male with known history of CKD stage IIIa, pulmonary  hypertension, and alcohol related dilated cardiomyopathy with chronic combined diastolic and systolic congestive heart failure who presents in cardiogenic shock.   Assessment/Plan  1. Acute on chronic systolic HF -> Cardiogenic shock: Patient presented in cardiogenic shock, hypotensive with mildly elevated lactate.  He was placed on norepinephrine and subsequently dobutamine with improvement in his lactate.  His Coox was 35% which gives a cardiac index of 1.5 by assumed Fick.   - RHC 11/1 with R>L failure RA 17 PA 55/28 (39) PCWP 18 Thermo 4.1/1.8 PAPi 1.5  -CVP 20. Co-ox 72% but SBP running in 70s despite DBA 5 -Holding GDMT with hypotensive cardiogenic shock -Poor advanced therapies candidate -Continue fluid restriction -Repeat echo 10/27 EF 20-25%, LV with GHK, RV mod reduced, mod elevated PASP, LA/RA severely dilated, severe MR/TR - With low BP will move to ICU for NE support.  - Continue IV diuretics - Poor candidate for advanced therapies with ongoing heavy alcohol use, potential cirrhosis    2. AKI on CKD: Baseline likely around 1.6-1.9 based on outside labs, elevated to 3.7 on arrival.   - Likely secondary to cardiorenal syndrome. - 2.07 -> 2.19 today - Plan as above   3. Alcohol abuse: Drinks at least a pint of liquor every day, has not gone without alcohol in many years.  Denies withdrawal symptoms in the past. -Continue CIWA protocol, now on phenobarbital, 1mg  ativan overnight -Thiamine/folate  4. ?Cirrhosis: Based on soft BP, years of heavy alcohol use,  and evidence of synthetic dysfunction (platelets, elevated bilirubin) is likely a possibility. PT-INR 18/1.5, can consider abdominal imaging when more stable. - MELD labs ordered - Abdominal imaging when more stable  Move to ICU for potential NE support   CRITICAL CARE Performed by: Arvilla Meres  Total critical care time: 45 minutes  Critical care time was exclusive of separately billable procedures and treating  other patients.  Critical care was necessary to treat or prevent imminent or life-threatening deterioration.  Critical care was time spent personally by me (independent of midlevel providers or residents) on the following activities: development of treatment plan with patient and/or surrogate as well as nursing, discussions with consultants, evaluation of patient's response to treatment, examination of patient, obtaining history from patient or surrogate, ordering and performing treatments and interventions, ordering and review of laboratory studies, ordering and review of radiographic studies, pulse oximetry and re-evaluation of patient's condition.   Length of Stay: 7  Arvilla Meres, MD  10/19/2023, 2:45 PM  Advanced Heart Failure Team Pager [phone] (M-F; 7a - 5p)  Please contact [facility] for night-coverage after hours (5p -7a ) and weekends on [email or web address]

## 2023-10-19 NOTE — Evaluation (Signed)
Occupational Therapy Evaluation Patient Details Name: Christopher [name] MRN: [id number] DOB: November 29, 1966 Today's Date: 10/19/2023   History of Present Illness Pt is a 57 y.o. male who presented 10/12/23 with gradually worsening SOB x1 month. Admitted with cardiogenic shock. S/p RHC 11/1. PMH: CKD stage IIIa, pulmonary hypertension, and alcohol related dilated cardiomyopathy with chronic combined diastolic and systolic congestive heart failure, HTN, gout, EtOH use, some tobacco use   Clinical Impression   PTA, pt was working at [employer] and was independent with ADL/IADL and functional mobility. Pt currently requires minA for functional mobility due to instability and 1x loss of balance with initial stand. Pt presents with cognitive limitations (see cognition section for further details) impacting safety and independence with ADL/IADL and mobility. At this time recommend follow-up therapy prior to d/c home to address stability, activity tolerance and cognition limitations to maximize return to prior level of functioning. Patient will benefit from continued inpatient follow up therapy, <3 hours/day. Will continue to follow acutely and progress appropriately.       If plan is discharge home, recommend the following: A little help with walking and/or transfers;A little help with bathing/dressing/bathroom;Direct supervision/assist for medications management;Direct supervision/assist for financial management;Assist for transportation    Functional Status Assessment  Patient has had a recent decline in their functional status and demonstrates the ability to make significant improvements in function in a reasonable and predictable amount of time.  Equipment Recommendations  None recommended by OT    Recommendations for Other Services       Precautions / Restrictions Precautions Precautions: Fall Precaution Comments: watch HR and BP Restrictions Weight Bearing Restrictions: No      Mobility Bed  Mobility Overal bed mobility: Needs Assistance Bed Mobility: Supine to Sit     Supine to sit: HOB elevated, Contact guard     General bed mobility comments: cga for safety pt with reliance on heavy momentum and use of bed rails    Transfers Overall transfer level: Needs assistance Equipment used: Rolling walker (2 wheels) Transfers: Sit to/from Stand Sit to Stand: Min assist           General transfer comment: minA for balance, pt with 1x loss of balance with initial stand requiring assistance to correct.      Balance Overall balance assessment: Needs assistance Sitting-balance support: No upper extremity supported, Feet supported Sitting balance-Leahy Scale: Fair Sitting balance - Comments: Able to sit statically EOB with CGA, did display x1 LOB to R with minA to recover   Standing balance support: Bilateral upper extremity supported, During functional activity, Reliant on assistive device for balance, No upper extremity supported Standing balance-Leahy Scale: Fair Standing balance comment: Able to stand statically without UE support, but mild sway noted. Benefits from RW and up to minA to prevent LOB when ambulating                           ADL either performed or assessed with clinical judgement   ADL Overall ADL's : Needs assistance/impaired Eating/Feeding: Independent   Grooming: Contact guard assist;Standing   Upper Body Bathing: Set up;Sitting   Lower Body Bathing: Contact guard assist   Upper Body Dressing : Set up   Lower Body Dressing: Contact guard assist;Sit to/from stand   Toilet Transfer: Minimal assistance;Ambulation Toilet Transfer Details (indicate cue type and reason): ambulated in room, distance to bathroom Toileting- Clothing Manipulation and Hygiene: Minimal assistance;Sit to/from stand  Functional mobility during ADLs: Minimal assistance General ADL Comments: ambulated in room without AD, minA for stability. Pt with  slight instability and poor safety awareness requring cues for slow turns, line awareness     Vision         Perception         Praxis         Pertinent Vitals/Pain Pain Assessment Pain Assessment: No/denies pain Faces Pain Scale: No hurt     Extremity/Trunk Assessment Upper Extremity Assessment Upper Extremity Assessment: Generalized weakness   Lower Extremity Assessment Lower Extremity Assessment: Defer to PT evaluation   Cervical / Trunk Assessment Cervical / Trunk Assessment: Normal   Communication Communication Communication: No apparent difficulties   Cognition Arousal: Alert Behavior During Therapy: Flat affect Overall Cognitive Status: Impaired/Different from baseline Area of Impairment: Attention, Memory, Following commands, Safety/judgement, Awareness, Problem solving                   Current Attention Level: Sustained Memory: Decreased short-term memory Following Commands: Follows one step commands consistently, Follows one step commands with increased time Safety/Judgement: Decreased awareness of safety, Decreased awareness of deficits Awareness: Emergent Problem Solving: Slow processing, Difficulty sequencing, Requires verbal cues General Comments: pt oriented to date, presents with impulsivity, decreased processing speed, poor problem solving, decreased STM and decreased awareness requiring frequent reminders for line management. Pt scored a 10/28 on the short blessed test, this score indicates an "impairment consistent with dementia" and warrants further assessments. pt with poor safety awareness, with initial loss of balance when standing, when prompted pt stated he did not notice the loss of balance     General Comments  vss WNL on RA BP NL    Exercises     Shoulder Instructions      Home Living Family/patient expects to be discharged to:: Private residence Living Arrangements: Alone Available Help at Discharge: Family;Available  PRN/intermittently (52 y.o. father, who pt reports is healthy enough to assist pt but not 24/7) Type of Home: Apartment Home Access: Stairs to enter [employer] of Steps: x3 flights Entrance Stairs-Rails: Left Home Layout: One level     Bathroom Shower/Tub: Chief Strategy Officer: Standard     Home Equipment: None          Prior Functioning/Environment Prior Level of Function : Independent/Modified Independent;Driving;Working/employed               ADLs Comments: Works in Programme researcher, broadcasting/film/video at [employer]        OT Problem List: Decreased activity tolerance;Decreased cognition;Decreased safety awareness      OT Treatment/Interventions: Self-care/ADL training;Energy conservation;Cognitive remediation/compensation;Patient/family education;Balance training    OT Goals(Current goals can be found in the care plan section) Acute Rehab OT Goals Patient Stated Goal: to go home OT Goal Formulation: With patient Time For Goal Achievement: 11/02/23 Potential to Achieve Goals: Good ADL Goals Pt Will Perform Grooming: Independently;standing Pt Will Transfer to Toilet: Independently;ambulating Additional ADL Goal #1: Pt will demonstrate anticipatory awareness for safe engagement in ADL. Additional ADL Goal #2: Pt will complete multistep cognitive task with <3 errors.  OT Frequency: Min 2X/week    Co-evaluation              AM-PAC OT "6 Clicks" Daily Activity     Outcome Measure Help from another person eating meals?: None Help from another person taking care of personal grooming?: A Little Help from another person toileting, which includes using toliet, bedpan, or urinal?: A Little Help from  another person bathing (including washing, rinsing, drying)?: A Little Help from another person to put on and taking off regular upper body clothing?: A Little Help from another person to put on and taking off regular lower body clothing?: A Little 6 Click Score: 19   End  of Session Equipment Utilized During Treatment: Gait belt Nurse Communication: Mobility status  Activity Tolerance: Patient tolerated treatment well Patient left: in chair;with call bell/phone within reach;with chair alarm set  OT Visit Diagnosis: Other abnormalities of gait and mobility (R26.89);Muscle weakness (generalized) (M62.81);Other symptoms and signs involving cognitive function                Time: 0936-1010 OT Time Calculation (min): 34 min Charges:  OT General Charges $OT Visit: 1 Visit OT Evaluation $OT Eval Low Complexity: 1 Low OT Treatments $Self Care/Home Management : 8-22 mins  Rosey Bath OTR/L Acute Rehabilitation Services Office: [phone]   Providence Crosby 10/19/2023, 10:56 AM

## 2023-10-19 NOTE — Progress Notes (Signed)
@  0109, Dr. Regino Schultze, on-call for attending, text-paged regarding pt's BPs ([phone]/[id number]). Pt asymptomatic but tired and resting in bed. MD to bedside and after assessing pt endorsed to continue to monitor BP and if it dropped further to repage for potentially reducing the rate of pt's Dobutamine. MD also endorsed to hold 0045 scheduled dose of Phenobarb for pt.

## 2023-10-20 ENCOUNTER — Inpatient Hospital Stay (HOSPITAL_COMMUNITY): Payer: Commercial Managed Care - PPO

## 2023-10-20 ENCOUNTER — Other Ambulatory Visit: Payer: Self-pay

## 2023-10-20 DIAGNOSIS — R570 Cardiogenic shock: Secondary | ICD-10-CM | POA: Diagnosis not present

## 2023-10-20 LAB — BASIC METABOLIC PANEL
Anion gap: 13 (ref 5–15)
BUN: 47 mg/dL — ABNORMAL HIGH (ref 6–20)
CO2: 19 mmol/L — ABNORMAL LOW (ref 22–32)
Calcium: 9 mg/dL (ref 8.9–10.3)
Chloride: 94 mmol/L — ABNORMAL LOW (ref 98–111)
Creatinine, Ser: 2.24 mg/dL — ABNORMAL HIGH (ref 0.61–1.24)
GFR, Estimated: 33 mL/min — ABNORMAL LOW (ref >60)
Glucose, Bld: 164 mg/dL — ABNORMAL HIGH (ref 70–99)
Potassium: 3.7 mmol/L (ref 3.5–5.1)
Sodium: 126 mmol/L — ABNORMAL LOW (ref 135–145)

## 2023-10-20 LAB — GLUCOSE, CAPILLARY
Glucose-Capillary: 95 mg/dL (ref 70–99)
Glucose-Capillary: 134 mg/dL — ABNORMAL HIGH (ref 70–99)
Glucose-Capillary: 117 mg/dL — ABNORMAL HIGH (ref 70–99)
Glucose-Capillary: 102 mg/dL — ABNORMAL HIGH (ref 70–99)
Glucose-Capillary: 117 mg/dL — ABNORMAL HIGH (ref 70–99)

## 2023-10-20 LAB — COOXEMETRY PANEL
Carboxyhemoglobin: 1.7 % — ABNORMAL HIGH (ref 0.5–1.5)
Methemoglobin: <0.7 % (ref 0.0–1.5)
O2 Saturation: 62.8 %
Total hemoglobin: 9.3 g/dL — ABNORMAL LOW (ref 12.0–16.0)

## 2023-10-20 MED ORDER — FUROSEMIDE 10 MG/ML IJ SOLN
80.0000 mg | Freq: Once | INTRAMUSCULAR | Status: AC
  Administered 2023-10-20: 80 mg via INTRAVENOUS
  Filled 2023-10-20: qty 8

## 2023-10-20 MED ORDER — TORSEMIDE 20 MG PO TABS
60.0000 mg | ORAL_TABLET | Freq: Every day | ORAL | Status: DC
Start: 2023-10-20 — End: 2023-10-20

## 2023-10-20 MED ORDER — POTASSIUM CHLORIDE CRYS ER 20 MEQ PO TBCR
40.0000 meq | EXTENDED_RELEASE_TABLET | Freq: Once | ORAL | Status: AC
  Administered 2023-10-20: 40 meq via ORAL
  Filled 2023-10-20: qty 2

## 2023-10-20 MED ORDER — HYDROXYZINE HCL 25 MG PO TABS
25.0000 mg | ORAL_TABLET | Freq: Four times a day (QID) | ORAL | Status: DC | PRN
  Administered 2023-10-20 – 2023-10-25 (×6): 25 mg via ORAL
  Filled 2023-10-20 (×7): qty 1

## 2023-10-20 NOTE — Progress Notes (Signed)
Advanced Heart Failure Rounding Note  PCP-Cardiologist: None   Subjective:   - Echo 10/27 EF 20-25%, LV with GHK, RV mod reduced, mod elevated PASP, LA/RA severely dilated, severe MR/TR - 10/29 DBA cut back  to 2.5 mcg. CVP low. Diuretics held.  - 10/30 hypotensive overnight, NE restarted. A line placed, NE weaned off by midnight - 11/1 RHC 11/1 with R>L failure RA 17 PA 55/28 (39) PCWP 18 Thermo 4.1/1.8 PAPi 1.5  - 11/3 Moved to ICU for hypotension despite DBA 5 (SBP stable in 90s once in unit)  Remains on DBA 5. NE 6  SBP 90s overnight but hard to get this am. I spent a long time trying different cuff positions and using Doppler. Left wrist finally worked well.   Co-ox 63% CVP 18 Scr stable at 2.2  Denies CP or SOB. Fees weeak  Objective:   Weight Range: 102.6 kg Body mass index is 32.22 kg/m.   Vital Signs:   Temp:  [97.5 F (36.4 C)-98.3 F (36.8 C)] 97.5 F (36.4 C) (11/03 0800) Pulse Rate:  [79-228] 207 (11/03 0830) Resp:  [12-28] 17 (11/03 0830) BP: (42-144)/(21-112) 78/63 (11/03 0830) SpO2:  [81 %-100 %] 100 % (11/03 0830) Weight:  [102.6 kg] 102.6 kg (11/03 0500) Last BM Date : 10/19/23  Weight change: Filed Weights   10/18/23 0900 10/19/23 0500 10/20/23 0500  Weight: 101.7 kg 101.2 kg 102.6 kg    Intake/Output:   Intake/Output Summary (Last 24 hours) at 10/20/2023 1005 Last data filed at 10/20/2023 1000 Gross per 24 hour  Intake 1916.49 ml  Output 700 ml  Net 1216.49 ml    Physical Exam   General:  Sitting up in bed. No resp difficulty HEENT: normal Neck: supple. JVP to ear Carotids 2+ bilat; no bruits. No lymphadenopathy or thryomegaly appreciated. Cor: Regular tachy. No rubs, gallops or murmurs. Lungs: clear Abdomen: soft, nontender, nondistended. No hepatosplenomegaly. No bruits or masses. Good bowel sounds. Extremities: no cyanosis, clubbing, rash, edema Neuro: alert & orientedx3, cranial nerves grossly intact. moves all 4 extremities w/o  difficulty. Affect pleasant  Telemetry   Sinus tach 100-110 Personally reviewed  Patient Profile   57 year old male with known history of CKD stage IIIa, pulmonary hypertension, and alcohol related dilated cardiomyopathy with chronic combined diastolic and systolic congestive heart failure who presents in cardiogenic shock.   Assessment/Plan  1. Acute on chronic systolic HF -> Cardiogenic shock: Patient presented in cardiogenic shock, hypotensive with mildly elevated lactate.  He was placed on norepinephrine and subsequently dobutamine with improvement in his lactate.  His Coox was 35% which gives a cardiac index of 1.5 by assumed Fick.   - Longstanding NICM EF < 20%. Normal cors by cath 2012 with EF 20% PAH in setting of ETOH abuse - Echo 2020: EF 20% RV moderately HK severe MR/TR - RHC 11/1 with R>L failure RA 17 PA 55/28 (39) PCWP 18 Thermo 4.1/1.8 PAPi 1.5  - Failed NE wean and moved back to ICU 11/2 for NE support - Today on NE 6 DBA 5 Co-ox 63% CVP 18-19 SBP in 90s - He has end-stage CM with severe biventricular failure (R>L). Now inotrope dependent - Poor candidate for advanced therapies with ongoing heavy alcohol use, biventricular failure and potential cirrhosis - Considered cMRI to further evaluate but doubt this will change management - Will continue inotropes today. Give 1 dose IV lasix - Consult Palliative Care    2. AKI on CKD: Baseline likely around  1.6-1.9 based on outside labs, elevated to 3.7 on arrival.   - Likely secondary to cardiorenal syndrome/shock - 2.07 -> 2.19 -> 2.24  today - Plan as above   3. Alcohol abuse: Drinks at least a pint of liquor every day, has not gone without alcohol in many years.  Denies withdrawal symptoms in the past. -Continue CIWA protocol, now on phenobarbital, 1mg  ativan overnight -Thiamine/folate  4. ?Cirrhosis: Based on soft BP, years of heavy alcohol use, and evidence of synthetic dysfunction (platelets, elevated bilirubin) is  likely a possibility. PT-INR 18/1.5, can consider abdominal imaging when more stable. - MELD labs ordered - Abdominal imaging when more stable  5. Hyponatremia - poor prognostic marker - limit FW - treat HF    CRITICAL CARE Performed by: Arvilla Meres  Total critical care time: 50 minutes  Critical care time was exclusive of separately billable procedures and treating other patients.  Critical care was necessary to treat or prevent imminent or life-threatening deterioration.  Critical care was time spent personally by me (independent of midlevel providers or residents) on the following activities: development of treatment plan with patient and/or surrogate as well as nursing, discussions with consultants, evaluation of patient's response to treatment, examination of patient, obtaining history from patient or surrogate, ordering and performing treatments and interventions, ordering and review of laboratory studies, ordering and review of radiographic studies, pulse oximetry and re-evaluation of patient's condition.   Length of Stay: 8  Arvilla Meres, MD  10/20/2023, 10:05 AM  Advanced Heart Failure Team Pager [phone] (M-F; 7a - 5p)  Please contact [facility] for night-coverage after hours (5p -7a ) and weekends on [email or web address]

## 2023-10-20 NOTE — Plan of Care (Signed)
  Problem: Clinical Measurements: Goal: Will remain free from infection Outcome: Progressing Goal: Respiratory complications will improve Outcome: Progressing   Problem: Activity: Goal: Risk for activity intolerance will decrease Outcome: Progressing   Problem: Nutrition: Goal: Adequate nutrition will be maintained Outcome: Progressing   Problem: Pain Management: Goal: General experience of comfort will improve Outcome: Progressing

## 2023-10-20 NOTE — Progress Notes (Signed)
Physical Therapy Treatment Patient Details Name: Christopher [name] MRN: [id number] DOB: 05-31-1966 Today's Date: 10/20/2023   History of Present Illness Pt is a 57 y.o. male who presented 10/12/23 with gradually worsening SOB x1 month. Admitted with cardiogenic shock. S/p RHC 11/1. PMH: CKD stage IIIa, pulmonary hypertension, and alcohol related dilated cardiomyopathy with chronic combined diastolic and systolic congestive heart failure, HTN, gout, EtOH use, some tobacco use    PT Comments  Patient with improved ambulation once RW removed though noted fatigue with increased distance pt with standing rest break (aware his BP had been low and asking what it was).  However, remains at risk for falls with decreased safety awareness, decreased balance and concern for home alone with cognition deficits.  Continue to recommend follow up inpatient rehab (<3 hours/day) since he lives alone.  PT will continue to follow in the acute setting.  If he progresses or refuses inpatient rehab, would need max HH services at d/c.     If plan is discharge home, recommend the following: A little help with walking and/or transfers;A little help with bathing/dressing/bathroom;Assistance with cooking/housework;Direct supervision/assist for medications management;Direct supervision/assist for financial management;Assist for transportation;Help with stairs or ramp for entrance   Can travel by private vehicle     Yes  Equipment Recommendations  Rolling walker (2 wheels);BSC/3in1    Recommendations for Other Services       Precautions / Restrictions Precautions Precautions: Fall Precaution Comments: watch HR and BP     Mobility  Bed Mobility Overal bed mobility: Needs Assistance Bed Mobility: Supine to Sit, Sit to Supine     Supine to sit: Supervision Sit to supine: Supervision   General bed mobility comments: A for safety with lines    Transfers Overall transfer level: Needs assistance Equipment used:  Rolling walker (2 wheels) Transfers: Sit to/from Stand Sit to Stand: Contact guard assist           General transfer comment: for balance and lines    Ambulation/Gait Ambulation/Gait assistance: Contact guard assist, Min assist Gait Distance (Feet): 320 Feet (approx) Assistive device: Rolling walker (2 wheels), None Gait Pattern/deviations: Step-through pattern, Decreased stride length, Wide base of support, Decreased stance time - left       General Gait Details: initially with RW though pt not managing well so removed RW and pt with improved safety with CGA for lines, decreased foot clearance and with mild limp on L though pt denies pain; one standing rest break to check BP   Stairs             Wheelchair Mobility     Tilt Bed    Modified Rankin (Stroke Patients Only)       Balance Overall balance assessment: Needs assistance   Sitting balance-Leahy Scale: Good     Standing balance support: No upper extremity supported Standing balance-Leahy Scale: Good Standing balance comment: static balance and pt able to ambulate without UE support with CGA for safety                            Cognition Arousal: Alert Behavior During Therapy: WFL for tasks assessed/performed   Area of Impairment: Safety/judgement, Problem solving                         Safety/Judgement: Decreased awareness of safety, Decreased awareness of deficits   Problem Solving: Slow processing, Requires verbal cues, Difficulty sequencing General Comments:  not aware he was moving walker at an angle even when PT kicking straight x2 so removed walker to improve safety; patient admits to difficulties completing his job and negotiating steps PTA due to fatigue        Exercises      General Comments General comments (skin integrity, edema, etc.): BP during standing rest 111/59; HR 114 and SpO2 91% on RA; patient with purewick though bed soiled with urine so pt sat on  chair for PT to change linen; RN aware      Pertinent Vitals/Pain Pain Assessment Faces Pain Scale: No hurt    Home Living                          Prior Function            PT Goals (current goals can now be found in the care plan section) Progress towards PT goals: Progressing toward goals    Frequency    Min 1X/week      PT Plan      Co-evaluation              AM-PAC PT "6 Clicks" Mobility   Outcome Measure  Help needed turning from your back to your side while in a flat bed without using bedrails?: None Help needed moving from lying on your back to sitting on the side of a flat bed without using bedrails?: None Help needed moving to and from a bed to a chair (including a wheelchair)?: A Little Help needed standing up from a chair using your arms (e.g., wheelchair or bedside chair)?: A Little Help needed to walk in hospital room?: A Little Help needed climbing 3-5 steps with a railing? : Total 6 Click Score: 18    End of Session Equipment Utilized During Treatment: Gait belt Activity Tolerance: Patient tolerated treatment well Patient left: in bed;with call bell/phone within reach   PT Visit Diagnosis: Other abnormalities of gait and mobility (R26.89);Muscle weakness (generalized) (M62.81)     Time: 1610-9604 PT Time Calculation (min) (ACUTE ONLY): 25 min  Charges:    $Gait Training: 8-22 mins $Therapeutic Activity: 8-22 mins PT General Charges $$ ACUTE PT VISIT: 1 Visit                     Sheran Lawless, PT Acute Rehabilitation Services Office:[phone] 10/20/2023    Christopher [name] 10/20/2023, 5:02 PM

## 2023-10-20 NOTE — Progress Notes (Signed)
Peripherally Inserted Central Catheter Placement  The IV Nurse has discussed with the patient and/or persons authorized to consent for the patient, the purpose of this procedure and the potential benefits and risks involved with this procedure.  The benefits include less needle sticks, lab draws from the catheter, and the patient may be discharged home with the catheter. Risks include, but not limited to, infection, bleeding, blood clot (thrombus formation), and puncture of an artery; nerve damage and irregular heartbeat and possibility to perform a PICC exchange if needed/ordered by physician.  Alternatives to this procedure were also discussed.  Bard Power PICC patient education guide, fact sheet on infection prevention and patient information card has been provided to patient /or left at bedside.    PICC Placement Documentation  PICC Double Lumen 10/20/23 Right Brachial 42 cm 0 cm (Active)  Indication for Insertion or Continuance of Line Vasoactive infusions 10/20/23 1136  Exposed Catheter (cm) 0 cm 10/20/23 1136  Site Assessment Clean, Dry, Intact 10/20/23 1136  Lumen #1 Status Flushed;Saline locked;Blood return noted 10/20/23 1136  Lumen #2 Status Flushed;Saline locked;Blood return noted 10/20/23 1136  Dressing Type Transparent;Securing device 10/20/23 1136  Dressing Status Antimicrobial disc in place;Clean, Dry, Intact 10/20/23 1136  Line Care Connections checked and tightened 10/20/23 1136  Line Adjustment (NICU/IV Team Only) No 10/20/23 1136  Dressing Intervention New dressing;Adhesive placed at insertion site (IV team only);Adhesive placed around edges of dressing (IV team/ICU RN only) 10/20/23 1136  Dressing Change Due 10/27/23 10/20/23 1136       Christopher [name] 10/20/2023, 11:37 AM

## 2023-10-21 ENCOUNTER — Encounter (HOSPITAL_COMMUNITY): Payer: Self-pay | Admitting: Cardiology

## 2023-10-21 DIAGNOSIS — Z515 Encounter for palliative care: Secondary | ICD-10-CM | POA: Diagnosis not present

## 2023-10-21 DIAGNOSIS — N179 Acute kidney failure, unspecified: Secondary | ICD-10-CM | POA: Diagnosis not present

## 2023-10-21 DIAGNOSIS — I5023 Acute on chronic systolic (congestive) heart failure: Secondary | ICD-10-CM | POA: Diagnosis not present

## 2023-10-21 DIAGNOSIS — Z7189 Other specified counseling: Secondary | ICD-10-CM

## 2023-10-21 DIAGNOSIS — R570 Cardiogenic shock: Secondary | ICD-10-CM | POA: Diagnosis not present

## 2023-10-21 LAB — GLUCOSE, CAPILLARY
Glucose-Capillary: 95 mg/dL (ref 70–99)
Glucose-Capillary: 105 mg/dL — ABNORMAL HIGH (ref 70–99)
Glucose-Capillary: 110 mg/dL — ABNORMAL HIGH (ref 70–99)
Glucose-Capillary: 112 mg/dL — ABNORMAL HIGH (ref 70–99)

## 2023-10-21 LAB — HEPATIC FUNCTION PANEL
ALT: 36 U/L (ref 0–44)
AST: 43 U/L — ABNORMAL HIGH (ref 15–41)
Albumin: 3.4 g/dL — ABNORMAL LOW (ref 3.5–5.0)
Alkaline Phosphatase: 73 U/L (ref 38–126)
Bilirubin, Direct: 0.4 mg/dL — ABNORMAL HIGH (ref 0.0–0.2)
Indirect Bilirubin: 0.7 mg/dL (ref 0.3–0.9)
Total Bilirubin: 1.1 mg/dL (ref <1.2)
Total Protein: 6.1 g/dL — ABNORMAL LOW (ref 6.5–8.1)

## 2023-10-21 LAB — BASIC METABOLIC PANEL
Anion gap: 14 (ref 5–15)
Anion gap: 11 (ref 5–15)
BUN: 48 mg/dL — ABNORMAL HIGH (ref 6–20)
BUN: 48 mg/dL — ABNORMAL HIGH (ref 6–20)
CO2: 18 mmol/L — ABNORMAL LOW (ref 22–32)
CO2: 20 mmol/L — ABNORMAL LOW (ref 22–32)
Calcium: 9 mg/dL (ref 8.9–10.3)
Calcium: 9.2 mg/dL (ref 8.9–10.3)
Chloride: 91 mmol/L — ABNORMAL LOW (ref 98–111)
Chloride: 93 mmol/L — ABNORMAL LOW (ref 98–111)
Creatinine, Ser: 2.39 mg/dL — ABNORMAL HIGH (ref 0.61–1.24)
Creatinine, Ser: 2.39 mg/dL — ABNORMAL HIGH (ref 0.61–1.24)
GFR, Estimated: 30 mL/min — ABNORMAL LOW (ref >60)
GFR, Estimated: 30 mL/min — ABNORMAL LOW (ref >60)
Glucose, Bld: 180 mg/dL — ABNORMAL HIGH (ref 70–99)
Glucose, Bld: 170 mg/dL — ABNORMAL HIGH (ref 70–99)
Potassium: 3.9 mmol/L (ref 3.5–5.1)
Potassium: 4.4 mmol/L (ref 3.5–5.1)
Sodium: 123 mmol/L — ABNORMAL LOW (ref 135–145)
Sodium: 124 mmol/L — ABNORMAL LOW (ref 135–145)

## 2023-10-21 LAB — COOXEMETRY PANEL
Carboxyhemoglobin: 1.6 % — ABNORMAL HIGH (ref 0.5–1.5)
Carboxyhemoglobin: 1.6 % — ABNORMAL HIGH (ref 0.5–1.5)
Methemoglobin: <0.7 % (ref 0.0–1.5)
Methemoglobin: <0.7 % (ref 0.0–1.5)
O2 Saturation: 54.2 %
O2 Saturation: 57.9 %
Total hemoglobin: 9.5 g/dL — ABNORMAL LOW (ref 12.0–16.0)
Total hemoglobin: 9.7 g/dL — ABNORMAL LOW (ref 12.0–16.0)

## 2023-10-21 LAB — CULTURE, BLOOD (ROUTINE X 2)
Culture: NO GROWTH
Culture: NO GROWTH
Special Requests: ADEQUATE
Special Requests: ADEQUATE

## 2023-10-21 LAB — SODIUM, URINE, RANDOM: Sodium, Ur: <10 mmol/L

## 2023-10-21 MED ORDER — POTASSIUM CHLORIDE CRYS ER 20 MEQ PO TBCR
40.0000 meq | EXTENDED_RELEASE_TABLET | Freq: Once | ORAL | Status: AC
  Administered 2023-10-21: 40 meq via ORAL
  Filled 2023-10-21: qty 2

## 2023-10-21 MED ORDER — METOLAZONE 2.5 MG PO TABS
2.5000 mg | ORAL_TABLET | Freq: Once | ORAL | Status: AC
  Administered 2023-10-21: 2.5 mg via ORAL
  Filled 2023-10-21: qty 1

## 2023-10-21 MED ORDER — SODIUM CHLORIDE 0.9 % IV SOLN: INTRAVENOUS | Status: AC | PRN

## 2023-10-21 MED ORDER — FUROSEMIDE 10 MG/ML IJ SOLN
30.0000 mg/h | INTRAVENOUS | Status: DC
  Administered 2023-10-21 (×2): 20 mg/h via INTRAVENOUS
  Administered 2023-10-22 (×2): 30 mg/h via INTRAVENOUS
  Administered 2023-10-22: 20 mg/h via INTRAVENOUS
  Administered 2023-10-23 – 2023-10-26 (×8): 30 mg/h via INTRAVENOUS
  Filled 2023-10-21 (×17): qty 20

## 2023-10-21 MED ORDER — TOLVAPTAN 15 MG PO TABS
15.0000 mg | ORAL_TABLET | Freq: Once | ORAL | Status: AC
Start: 2023-10-21 — End: 2023-10-21
  Administered 2023-10-21: 15 mg via ORAL
  Filled 2023-10-21: qty 1

## 2023-10-21 MED ORDER — FUROSEMIDE 10 MG/ML IJ SOLN
120.0000 mg | Freq: Two times a day (BID) | INTRAVENOUS | Status: DC
  Administered 2023-10-21: 120 mg via INTRAVENOUS
  Filled 2023-10-21: qty 12
  Filled 2023-10-21: qty 10

## 2023-10-21 NOTE — Progress Notes (Signed)
Physical Therapy Treatment Patient Details Name: Christopher [name] MRN: [id number] DOB: May 31, 1966 Today's Date: 10/21/2023   History of Present Illness Pt is a 57 y.o. male who presented 10/12/23 with gradually worsening SOB x1 month. Admitted with cardiogenic shock. S/p RHC 11/1. PMH: CKD stage IIIa, pulmonary hypertension, and alcohol related dilated cardiomyopathy with chronic combined diastolic and systolic congestive heart failure, HTN, gout, EtOH use, some tobacco use    PT Comments  Patient progressing with distance with ambulation, but more fatigued and at times needing more assistance due to veering and with distractions turning head he has LOB.  Still feel better than with regular RW, but possibly can improve with rollator and will attempt at next session.  Patient will continue to need 24 hour assist at d/c due to weakness and cognition.  PT will follow.     If plan is discharge home, recommend the following: A little help with walking and/or transfers;A little help with bathing/dressing/bathroom;Assistance with cooking/housework;Direct supervision/assist for medications management;Direct supervision/assist for financial management;Assist for transportation;Help with stairs or ramp for entrance   Can travel by private vehicle     Yes  Equipment Recommendations  Rollator (4 wheels);BSC/3in1    Recommendations for Other Services       Precautions / Restrictions Precautions Precautions: Fall Precaution Comments: watch HR and BP Restrictions Weight Bearing Restrictions: No     Mobility  Bed Mobility Overal bed mobility: Needs Assistance Bed Mobility: Supine to Sit, Sit to Supine     Supine to sit: Supervision Sit to supine: Supervision   General bed mobility comments: cues for positioning and safety for lines    Transfers Overall transfer level: Needs assistance Equipment used: None Transfers: Sit to/from Stand Sit to Stand: Supervision           General  transfer comment: stood while PT addressing lines for safety, no LOB    Ambulation/Gait Ambulation/Gait assistance: Min assist Gait Distance (Feet): 420 Feet Assistive device: None Gait Pattern/deviations: Step-through pattern, Step-to pattern, Decreased stride length, Drifts right/left, Scissoring       General Gait Details: needing increased support at times when rounding corners, turning head to look to sides and some veering and some scissoring noted with increased assist given at hips   Stairs             Wheelchair Mobility     Tilt Bed    Modified Rankin (Stroke Patients Only)       Balance Overall balance assessment: Needs assistance   Sitting balance-Leahy Scale: Good     Standing balance support: No upper extremity supported Standing balance-Leahy Scale: Fair Standing balance comment: static balance unaided, needs min A at times for ambulation                            Cognition Arousal: Alert Behavior During Therapy: WFL for tasks assessed/performed Overall Cognitive Status: Impaired/Different from baseline Area of Impairment: Safety/judgement, Problem solving, Awareness, Following commands, Memory, Attention                   Current Attention Level: Sustained Memory: Decreased short-term memory Following Commands: Follows one step commands consistently, Follows one step commands with increased time Safety/Judgement: Decreased awareness of safety     General Comments: talking on phone with someone giving zip code as [phone] and helped him realize too many numbers        Exercises      General Comments  General comments (skin integrity, edema, etc.): VSS with activity on RA.  Patient fatigued though BP a little higher in 90's/60's after ambulation.      Pertinent Vitals/Pain Pain Assessment Faces Pain Scale: No hurt    Home Living                          Prior Function            PT Goals (current  goals can now be found in the care plan section) Progress towards PT goals: Progressing toward goals    Frequency    Min 1X/week      PT Plan      Co-evaluation              AM-PAC PT "6 Clicks" Mobility   Outcome Measure  Help needed turning from your back to your side while in a flat bed without using bedrails?: A Little Help needed moving from lying on your back to sitting on the side of a flat bed without using bedrails?: A Little Help needed moving to and from a bed to a chair (including a wheelchair)?: A Little Help needed standing up from a chair using your arms (e.g., wheelchair or bedside chair)?: A Little Help needed to walk in hospital room?: A Little Help needed climbing 3-5 steps with a railing? : Total 6 Click Score: 16    End of Session Equipment Utilized During Treatment: Gait belt Activity Tolerance: Patient limited by fatigue Patient left: in bed;with call bell/phone within reach;with nursing/sitter in room   PT Visit Diagnosis: Other abnormalities of gait and mobility (R26.89);Muscle weakness (generalized) (M62.81)     Time: 1610-9604 PT Time Calculation (min) (ACUTE ONLY): 21 min  Charges:    $Gait Training: 8-22 mins PT General Charges $$ ACUTE PT VISIT: 1 Visit                     Christopher [name], PT Acute Rehabilitation Services Office:[phone] 10/21/2023    Christopher [name] 10/21/2023, 5:22 PM

## 2023-10-21 NOTE — Progress Notes (Addendum)
Advanced Heart Failure Rounding Note  PCP-Cardiologist: None   Subjective:   - Echo 10/27 EF 20-25%, LV with GHK, RV mod reduced, mod elevated PASP, LA/RA severely dilated, severe MR/TR - 10/29 DBA cut back  to 2.5 mcg. CVP low. Diuretics held.  - 10/30 hypotensive overnight, NE restarted. A line placed, NE weaned off by midnight - 11/1 RHC 11/1 with R>L failure RA 17 PA 55/28 (39) PCWP 18 Thermo 4.1/1.8 PAPi 1.5  - 11/3 Moved to ICU for hypotension despite DBA 5 (SBP stable in 90s once in unit). Got 1 dose of IV lasix.   Remains on DBA 5. NE 1    Co-ox 58%  CVP 22-23  Scr trending up 2.4   Feels ok. Denies SOB.    Objective:   Weight Range: 102.8 kg Body mass index is 32.29 kg/m.   Vital Signs:   Temp:  [95.9 F (35.5 C)-97.6 F (36.4 C)] 97 F (36.1 C) (11/04 0330) Pulse Rate:  [70-237] 93 (11/04 0700) Resp:  [13-44] 19 (11/04 0700) BP: (78-136)/(54-118) 94/72 (11/04 0700) SpO2:  [83 %-100 %] 100 % (11/04 0700) Weight:  [102.8 kg] 102.8 kg (11/04 0500) Last BM Date : 10/20/23  Weight change: Filed Weights   10/19/23 0500 10/20/23 0500 10/21/23 0500  Weight: 101.2 kg 102.6 kg 102.8 kg    Intake/Output:   Intake/Output Summary (Last 24 hours) at 10/21/2023 0805 Last data filed at 10/21/2023 0700 Gross per 24 hour  Intake 1574.95 ml  Output 550 ml  Net 1024.95 ml   Scheduled Meds:  Chlorhexidine Gluconate Cloth  6 each Topical Daily   folic acid  1 mg Oral Daily   heparin  5,000 Units Subcutaneous Q8H   insulin aspart  0-15 Units Subcutaneous TID WC   insulin aspart  0-5 Units Subcutaneous QHS   melatonin  5 mg Oral QHS   multivitamin with minerals  1 tablet Oral Daily   sodium chloride flush  10-40 mL Intracatheter Q12H   thiamine  100 mg Oral Daily   Or   thiamine  100 mg Intravenous Daily   Continuous Infusions:  sodium chloride Stopped (10/21/23 0906)   DOBUTamine 7.5 mcg/kg/min (10/21/23 1006)   furosemide (LASIX) 200 mg in dextrose 5 % 100  mL (2 mg/mL) infusion     norepinephrine (LEVOPHED) Adult infusion 1 mcg/min (10/21/23 1000)   PRN Meds:.sodium chloride, docusate sodium, hydrOXYzine, LORazepam, menthol-cetylpyridinium, mouth rinse, polyethylene glycol, sodium chloride flush   CVP 22-23  Physical Exam   General:   No resp difficulty HEENT: normal Neck: supple. JVP 22-23. Carotids 2+ bilat; no bruits. No lymphadenopathy or thryomegaly appreciated. Cor: PMI nondisplaced. Regular rate & rhythm. No rubs, gallops or murmurs. Lungs: clear Abdomen: soft, nontender, nondistended. No hepatosplenomegaly. No bruits or masses. Good bowel sounds. Extremities: no cyanosis, clubbing, rash, edema Neuro: alert & orientedx3, cranial nerves grossly intact. moves all 4 extremities w/o difficulty. Affect pleasant  Telemetry   SR 100s with occasional PVCs.    Patient Profile   57 year old male with known history of CKD stage IIIa, pulmonary hypertension, and alcohol related dilated cardiomyopathy with chronic combined diastolic and systolic congestive heart failure who presents in cardiogenic shock.   Assessment/Plan  1. Acute on chronic systolic HF -> Cardiogenic shock: Patient presented in cardiogenic shock, hypotensive with mildly elevated lactate.  He was placed on norepinephrine and subsequently dobutamine with improvement in his lactate.  His Coox was 35% which gives a cardiac index of 1.5  by assumed Fick.   - Longstanding NICM EF < 20%. Normal cors by cath 2012 with EF 20% PAH in setting of ETOH abuse - Echo 2020: EF 20% RV moderately HK severe MR/TR - RHC 11/1 with R>L failure RA 17 PA 55/28 (39) PCWP 18 Thermo 4.1/1.8 PAPi 1.5  - Failed NE wean and moved back to ICU 11/2 for NE support - Today on NE 1 DBA 5 Co-ox 58%.  -Sluggish diuresis.  CVP 22-23. Check urine sodium.  -Increase lasix 120 mg twice a day. Give 2.5 metolazone.  - Check BMET this afternoon.  - He has end-stage CM with severe biventricular failure (R>L). Now  inotrope dependent - Poor candidate for advanced therapies with ongoing heavy alcohol use, biventricular failure and potential cirrhosis - Considered cMRI to further evaluate but doubt this will change management - Consult Palliative Care. We discussed    2. AKI on CKD: Baseline likely around 1.6-1.9 based on outside labs, elevated to 3.7 on arrival.   - Likely secondary to cardiorenal syndrome/shock - 2.07 -> 2.19 -> 2.24 ->2.4 today - Plan as above   3. Alcohol abuse: Drinks at least a pint of liquor every day, has not gone without alcohol in many years.  Denies withdrawal symptoms in the past. -Continue CIWA protocol, completed phenobarbital and ativan.  -Thiamine/folate  4. ?Cirrhosis: Based on soft BP, years of heavy alcohol use, and evidence of synthetic dysfunction (platelets, elevated bilirubin) is likely a possibility. PT-INR 18/1.5, can consider abdominal imaging when more stable. - MELD labs ordered - Abdominal imaging when more stable  5. Hyponatremia - poor prognostic marker - limit FW - 123 today.  - Give dose of tolvaptan   GOC- Palliative Care consult.    Length of Stay: 9  Amy Clegg, NP  10/21/2023, 8:05 AM  Advanced Heart Failure Team Pager [phone] (M-F; 7a - 5p)  Please contact [facility] for night-coverage after hours (5p -7a ) and weekends on [email or web address]  Patient seen with NP, agree with the above note.   I/Os positive yesterday.  CVP remains > 20 with co-ox 54% initially and 57% on repeat.  He is on dobutamine 5 + NE 1 and is receiving Lasix 120 mg IV x 1 currently.  Creatinine mildly higher at 2.39, Na lower at 123   He denies dyspnea at rest.   General: NAD Neck: JVP 16+, no thyromegaly or thyroid nodule.  Lungs: Clear to auscultation bilaterally with normal respiratory effort. CV: Nondisplaced PMI.  Heart regular S1/S2, no S3/S4, no murmur.  Trace ankle edema.   Abdomen: Soft, nontender, no hepatosplenomegaly, no distention.  Skin: Intact  without lesions or rashes.  Neurologic: Alert and oriented x 3.  Psych: Normal affect. Extremities: No clubbing or cyanosis.  HEENT: Normal.   End stage biventricular cardiomyopathy in the setting of ETOH abuse (suspect ETOH cardiomyopathy).  Cardiorenal syndrome.  Marked volume overload with CVP > 20, suspect R>L heart failure.  - Increase dobutamine to 7.5 with low co-ox, continue NE 1.   - Lasix 120 mg IV x 1 now the gtt at 20 mg/hr.  Give metolazone 2.5 x 1.  - Hypervolemic hyponatremia, will give tolvaptan 15 mg x 1.   Discussed poor prognosis with patient and family today.  Palliative care consultation.  He is not a candidate for transplant or mechanical support with biventricular failure due to ongoing ETOH abuse.   CRITICAL CARE Performed by: Marca Ancona  Total critical care time: 40 minutes  Critical care  time was exclusive of separately billable procedures and treating other patients.  Critical care was necessary to treat or prevent imminent or life-threatening deterioration.  Critical care was time spent personally by me on the following activities: development of treatment plan with patient and/or surrogate as well as nursing, discussions with consultants, evaluation of patient's response to treatment, examination of patient, obtaining history from patient or surrogate, ordering and performing treatments and interventions, ordering and review of laboratory studies, ordering and review of radiographic studies, pulse oximetry and re-evaluation of patient's condition.  Marca Ancona 10/21/2023 10:26 AM

## 2023-10-21 NOTE — Plan of Care (Signed)

## 2023-10-21 NOTE — Progress Notes (Signed)
  Now on DBA 7.5 mcg + Norepi 1. Urine sodium < 10   Given 120 mg IV lasix and started on lasix drip at 20 mg per hour + 2.5 mg metolazone + tolvaptan.   CVP  remains 21-22.   Palliative Care consulted for GOC.   Lenya Sterne NP-C  3:12 PM

## 2023-10-21 NOTE — Progress Notes (Signed)
This chaplain began a spiritual care relationship with the Pt. per the consult from PMT NP-Mary.   The chaplain introduced herself and listened reflectively as the Pt. talked about his faith and the meaning of eternal life. The Pt. accepted the chaplain's invitation for prayer and connecting with his faith community in [place].  The chaplain understands the Pt. has worked at [employer] for six years. The Pt. smiles and waves at a co-worker waving at the window during the visit.    The Pt. agrees and welcomes the peace in taking one day at time.  Chaplain Stephanie Acre [phone]

## 2023-10-21 NOTE — Consult Note (Signed)
Consultation Note Date: 10/21/2023   Patient Name: Christopher [name]  DOB: July 17, 1966  MRN: [id number]  Age / Sex: 57 y.o., male  PCP: Kirby Funk, MD (Inactive) Referring Physician: Dorthula Nettles, DO  Reason for Consultation: Establishing goals of care  HPI/Patient Profile: 57 y.o. male   admitted on 10/12/2023 with   gradually worsening SOB x1 month.   Admitted with cardiogenic shock. S/p RHC 11/1. PMH: CKD stage IIIa, pulmonary hypertension, and alcohol related dilated cardiomyopathy with chronic combined diastolic and systolic congestive heart failure, HTN, gout, EtOH use, some tobacco use   End-stage biventricular cardiomyopathy sodium/123 today-poor prognostic indicator  Patient faces treatment option decisions, advanced directive decisions and anticipatory care needs.    Clinical Assessment and Goals of Care:   This NP Lorinda Creed reviewed medical records, received report from team, assessed the patient and then meet at the patient's bedside along with his father/George Cinco and Aunt/ Ophelia Charter  to discuss diagnosis, prognosis, GOC, EOL wishes disposition and options.   Concept of Palliative Care was introduced as specialized medical care for people and their families living with serious illness.  If focuses on providing relief from the symptoms and stress of a serious illness.  The goal is to improve quality of life for both the patient and the family.  Values and goals of care important to patient and family were attempted to be elicited.  Created space and opportunity for patient  and family to explore thoughts and feelings regarding current medical situation.  Patient quietly shares an understanding of the seriousness of his current medical situation.  Ultimately patient and his family hope for continued quality of life.  Patient remains open to all offered and available medical  interventions to prolong life.  Anticipatory care needs comes into discussion.  Patient was living alone prior to this hospitalization.  Patient's father is caring for his demented wife.    A  discussion was had today regarding advanced directives.  Concepts specific to code status, artifical feeding and hydration, continued IV antibiotics and rehospitalization was had.     The difference between a aggressive medical intervention path  and a palliative comfort care path for this patient at this time was had.    Education offered on hospice benefit; philosophy and eligibility.  Discussed hospice services both in the home and at a residential facility.   MOST form introduced and a Hard Choices booklet left for review     Questions and concerns addressed.  Patient  encouraged to call with questions or concerns.     PMT will continue to support holistically, follow-up tomorrow morning          No documented H POA or ACP documents.  Patient is interested in securing an H POA.  He verbalizes to me today that his father is the person who he would want to make decisions for him in the event that he could not make decisions for himself supported by his aunt Ophelia Charter.   Offered support from spiritual care  to secure these documents, he is interested and I will discuss with spiritual care  NEXT OF KIN    SUMMARY OF RECOMMENDATIONS    Code Status/Advance Care Planning: Full code Educated patient/family to consider DNR/DNI status understanding evidenced based poor outcomes in similar hospitalized patient, as the cause of arrest is likely associated with advanced chronic illness rather than an easily reversible acute cardio-pulmonary event.    Palliative Prophylaxis:  Delirium Protocol and Frequent Pain Assessment  Additional Recommendations (Limitations, Scope, Preferences): Full Scope Treatment  Psycho-social/Spiritual:  Desire for further Chaplaincy support:yes Additional  Recommendations: Education on Hospice  Prognosis:  Long term poor prognosis  Discharge Planning: To Be Determined      Primary Diagnoses: Present on Admission:  Acute on chronic systolic heart failure (HCC)  AKI (acute kidney injury) (HCC)   I have reviewed the medical record, interviewed the patient and family, and examined the patient. The following aspects are pertinent.  Past Medical History:  Diagnosis Date   Alcohol use    CHF (congestive heart failure) (HCC)    Chronic alcohol use    CKD stage 3a, GFR 45-59 ml/min (HCC)    Dilated cardiomyopathy (HCC)    Gout    Hypertension    Personal history of nicotine dependence    Social History   Socioeconomic History   Marital status: Single    Spouse name: Not on file   Number of children: 1   Years of education: Not on file   Highest education level: Not on file  Occupational History   Not on file  Tobacco Use   Smoking status: Former    Current packs/day: 0.00    Average packs/day: 0.3 packs/day for 4.0 years (1.0 ttl pk-yrs)    Types: Cigarettes    Start date: 05/05/2005    Quit date: 05/05/2009    Years since quitting: 14.4   Smokeless tobacco: Never  Vaping Use   Vaping status: Never Used  Substance and Sexual Activity   Alcohol use: Yes    Comment: beer & vodka daily   Drug use: Not Currently   Sexual activity: Not on file  Other Topics Concern   Not on file  Social History Narrative   Patient confirms full code status. 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   Social Determinants of Health   Financial Resource Strain: Low Risk  (05/28/2023)   Received from [facility]   Overall [employer] (CARDIA)    Difficulty of Paying Living Expenses: Not hard at all  Food Insecurity: No Food Insecurity (10/12/2023)   Hunger Vital Sign    Worried About Running Out of Food in the Last Year: Never true    Ran Out of Food in the Last Year:  Never true  Transportation Needs: No Transportation Needs (10/12/2023)   PRAPARE - Administrator, Civil Service (Medical): No    Lack of Transportation (Non-Medical): No  Physical Activity: Not on file  Stress: Not on file  Social Connections: Not on file   Family History  Problem Relation Age of Onset   Hypertension Mother    Scheduled Meds:  Chlorhexidine Gluconate Cloth  6 each Topical Daily   folic acid  1 mg Oral Daily   heparin  5,000 Units Subcutaneous Q8H   insulin aspart  0-15 Units Subcutaneous TID WC   insulin aspart  0-5 Units Subcutaneous QHS   melatonin  5 mg Oral QHS  multivitamin with minerals  1 tablet Oral Daily   sodium chloride flush  10-40 mL Intracatheter Q12H   thiamine  100 mg Oral Daily   Or   thiamine  100 mg Intravenous Daily   Continuous Infusions:  sodium chloride 10 mL/hr at 10/21/23 0904   DOBUTamine 5 mcg/kg/min (10/21/23 0800)   furosemide (LASIX) 200 mg in dextrose 5 % 100 mL (2 mg/mL) infusion     norepinephrine (LEVOPHED) Adult infusion 1 mcg/min (10/21/23 0800)   PRN Meds:.sodium chloride, docusate sodium, hydrOXYzine, LORazepam, menthol-cetylpyridinium, mouth rinse, polyethylene glycol, sodium chloride flush Medications Prior to Admission:  Prior to Admission medications   Medication Sig Start Date End Date Taking? Authorizing Provider  allopurinol (ZYLOPRIM) 300 MG tablet Take 1 tablet (300 mg total) by mouth daily. 09/09/23  Yes   aspirin EC 81 MG tablet Take 81 mg by mouth every morning.    Yes [provider]  potassium chloride (KLOR-CON M) 10 MEQ tablet TAKE 1 TABLET BY MOUTH TWICE DAILY WITH FOOD 02/07/21 10/11/24 Yes Kirby Funk, MD  potassium chloride (KLOR-CON M) 10 MEQ tablet Take 1 tablet (10 mEq total) by mouth 2 (two) times daily with food 05/15/23  Yes   potassium chloride (KLOR-CON) 10 MEQ tablet Take 1 tablet (10 mEq total) by mouth every morning. do not take until follow up with PCP/cardiology 08/31/20   Yes Tyrone Nine, MD  sacubitril-valsartan (ENTRESTO) 97-103 MG Take 1 tablet by mouth 2 (two) times daily. 08/21/23  Yes   tamsulosin (FLOMAX) 0.4 MG CAPS capsule Take 1 capsule (0.4 mg total) by mouth daily. 09/04/23  Yes   torsemide (DEMADEX) 20 MG tablet Take 1 tablet (20 mg total) by mouth daily. 03/06/23  Yes   carvedilol (COREG) 25 MG tablet Take 1 tablet (25 mg total) by mouth 2 (two) times daily. 05/15/23     carvedilol (COREG) 25 MG tablet Take 1 tablet (25 mg total) by mouth 2 (two) times daily. Patient not taking: Reported on 10/12/2023 10/10/23     folic acid (FOLVITE) 1 MG tablet Take 1 tablet (1 mg total) by mouth daily. Patient not taking: Reported on 10/12/2023 11/21/22     spironolactone (ALDACTONE) 25 MG tablet Take 1 tablet (25 mg total) by mouth daily. 03/06/23      No Known Allergies Review of Systems  Neurological:  Positive for weakness.    Physical Exam Cardiovascular:     Rate and Rhythm: Tachycardia present.  Pulmonary:     Effort: Pulmonary effort is normal.  Skin:    General: Skin is warm and dry.  Neurological:     Mental Status: He is alert and oriented to person, place, and time.     Vital Signs: BP 97/87   Pulse (!) 217   Temp (!) 96.5 F (35.8 C) (Axillary)   Resp 20   Ht 5' 10.25" (1.784 m)   Wt 102.8 kg   SpO2 100%   BMI 32.29 kg/m  Pain Scale: 0-10 POSS *See Group Information*: 1-Acceptable,Awake and alert Pain Score: 0-No pain   SpO2: SpO2: 100 % O2 Device:SpO2: 100 % O2 Flow Rate: .   IO: Intake/output summary:  Intake/Output Summary (Last 24 hours) at 10/21/2023 0958 Last data filed at 10/21/2023 0906 Gross per 24 hour  Intake 1586.33 ml  Output 550 ml  Net 1036.33 ml    LBM: Last BM Date : 10/20/23 Baseline Weight: Weight: 107.8 kg Most recent weight: Weight: 102.8 kg     Palliative Assessment/Data:  Time    75 minutes.  Signed by: Lorinda Creed, NP   Please contact Palliative Medicine Team phone at [phone] for  questions and concerns.  For individual provider: See Loretha Stapler

## 2023-10-21 NOTE — Progress Notes (Signed)
Occupational Therapy Treatment Patient Details Name: JT BRABEC MRN: [id number] DOB: [birth year] Today's Date: 10/21/2023   History of present illness Pt is a 57 y.o. male who presented 10/12/23 with gradually worsening SOB x1 month. Admitted with cardiogenic shock. S/p RHC 11/1. PMH: CKD stage IIIa, pulmonary hypertension, and alcohol related dilated cardiomyopathy with chronic combined diastolic and systolic congestive heart failure, HTN, gout, EtOH use, some tobacco use   OT comments  Pt seen for continued cognitive assessment/intervention. Poor delayed memory recall noted. Able to read medication labels on pillbox without difficulty, but with max difficulty correctly placing medication simulation. Upon OT observance of this, reoriented to task and pillbox set-up and pt continues with mod-max difficulty improving with practice and mod-max cues with downgrading task to therapeutic intervention rather than assessment. Pt admits he has not previously used pillbox and could benefit from additional education with cueing for reflection on task. Patient will benefit from continued inpatient follow up therapy, <3 hours/day       If plan is discharge home, recommend the following:  A little help with walking and/or transfers;A little help with bathing/dressing/bathroom;Direct supervision/assist for medications management;Direct supervision/assist for financial management;Assist for transportation   Equipment Recommendations  None recommended by OT    Recommendations for Other Services      Precautions / Restrictions Precautions Precautions: Fall Precaution Comments: watch HR and BP Restrictions Weight Bearing Restrictions: No       Mobility Bed Mobility Overal bed mobility: Needs Assistance Bed Mobility: Sit to Supine       Sit to supine: Supervision   General bed mobility comments: A for safety with lines    Transfers Overall transfer level: Needs assistance Equipment used:  Rolling walker (2 wheels) Transfers: Sit to/from Stand Sit to Stand: Contact guard assist           General transfer comment: for balance and lines     Balance Overall balance assessment: Needs assistance Sitting-balance support: No upper extremity supported, Feet supported Sitting balance-Leahy Scale: Good     Standing balance support: No upper extremity supported Standing balance-Leahy Scale: Good                             ADL either performed or assessed with clinical judgement   ADL Overall ADL's : Needs assistance/impaired                         Toilet Transfer: Contact guard assist;Cueing for sequencing;Stand-pivot Toilet Transfer Details (indicate cue type and reason): ton return to bed from chair prior to admin pillbox assessment                Extremity/Trunk Assessment Upper Extremity Assessment Upper Extremity Assessment: Generalized weakness   Lower Extremity Assessment Lower Extremity Assessment: Defer to PT evaluation        Vision       Perception     Praxis      Cognition Arousal: Alert Behavior During Therapy: [facility] for tasks assessed/performed Overall Cognitive Status: Impaired/Different from baseline Area of Impairment: Safety/judgement, Problem solving, Awareness, Following commands, Memory, Attention                   Current Attention Level: Sustained (in presence of min or no distraction) Memory: Decreased short-term memory Following Commands: Follows one step commands consistently, Follows one step commands with increased time Safety/Judgement: Decreased awareness of safety, Decreased awareness of deficits  Awareness: Intellectual, Emergent Problem Solving: Slow processing, Requires verbal cues, Difficulty sequencing General Comments: cues for safety and problem solving during transfers. Poor delayed memory recall but able to recall given two options. Pt performing pillbox assessment with mod+cues and  with significant errors from inception with filling out sections for two differing medications; OT downgrading task for purpose of therapeutic benefit to teach pt how to utilize pillbox and pt appreciative of feedback, however, continues with poor inisght into deficits, when asked if task was difficult and end stating no, but able to verbalize that OT asissted him "A lot"        Exercises      Shoulder Instructions       General Comments      Pertinent Vitals/ Pain       Pain Assessment Pain Assessment: No/denies pain Pain Intervention(s): Monitored during session  Home Living                                          Prior Functioning/Environment              Frequency  Min 2X/week        Progress Toward Goals  OT Goals(current goals can now be found in the care plan section)  Progress towards OT goals: Progressing toward goals  Acute Rehab OT Goals Patient Stated Goal: go home OT Goal Formulation: With patient Time For Goal Achievement: 11/02/23 Potential to Achieve Goals: Good ADL Goals Pt Will Perform Grooming: Independently;standing Pt Will Transfer to Toilet: Independently;ambulating Additional ADL Goal #1: Pt will demonstrate anticipatory awareness for safe engagement in ADL. Additional ADL Goal #2: Pt will complete multistep cognitive task with <3 errors.  Plan      Co-evaluation                 AM-PAC OT "6 Clicks" Daily Activity     Outcome Measure   Help from another person eating meals?: None Help from another person taking care of personal grooming?: A Little Help from another person toileting, which includes using toliet, bedpan, or urinal?: A Little Help from another person bathing (including washing, rinsing, drying)?: A Little Help from another person to put on and taking off regular upper body clothing?: A Little Help from another person to put on and taking off regular lower body clothing?: A Little 6 Click  Score: 19    End of Session Equipment Utilized During Treatment: Gait belt  OT Visit Diagnosis: Other abnormalities of gait and mobility (R26.89);Muscle weakness (generalized) (M62.81);Other symptoms and signs involving cognitive function   Activity Tolerance Patient tolerated treatment well   Patient Left in bed;with call bell/phone within reach;with bed alarm set;with family/visitor present   Nurse Communication Mobility status        Time: 1610-9604 OT Time Calculation (min): 32 min  Charges: OT General Charges $OT Visit: 1 Visit OT Treatments $Self Care/Home Management : 8-22 mins $Cognitive Funtion inital: Initial 15 mins  Tyler Deis, OTR/L [facility] Acute Rehabilitation Office: [phone]   Myrla Halsted 10/21/2023, 3:59 PM

## 2023-10-22 DIAGNOSIS — I5023 Acute on chronic systolic (congestive) heart failure: Secondary | ICD-10-CM | POA: Diagnosis not present

## 2023-10-22 DIAGNOSIS — R570 Cardiogenic shock: Secondary | ICD-10-CM | POA: Diagnosis not present

## 2023-10-22 LAB — COOXEMETRY PANEL
Carboxyhemoglobin: 1.1 % (ref 0.5–1.5)
Methemoglobin: <0.7 % (ref 0.0–1.5)
O2 Saturation: 58.4 %
Total hemoglobin: 9.9 g/dL — ABNORMAL LOW (ref 12.0–16.0)

## 2023-10-22 LAB — BASIC METABOLIC PANEL
Anion gap: 10 (ref 5–15)
Anion gap: 12 (ref 5–15)
BUN: 49 mg/dL — ABNORMAL HIGH (ref 6–20)
BUN: 53 mg/dL — ABNORMAL HIGH (ref 6–20)
CO2: 20 mmol/L — ABNORMAL LOW (ref 22–32)
CO2: 22 mmol/L (ref 22–32)
Calcium: 8.8 mg/dL — ABNORMAL LOW (ref 8.9–10.3)
Calcium: 9 mg/dL (ref 8.9–10.3)
Chloride: 93 mmol/L — ABNORMAL LOW (ref 98–111)
Chloride: 91 mmol/L — ABNORMAL LOW (ref 98–111)
Creatinine, Ser: 2.43 mg/dL — ABNORMAL HIGH (ref 0.61–1.24)
Creatinine, Ser: 2.47 mg/dL — ABNORMAL HIGH (ref 0.61–1.24)
GFR, Estimated: 30 mL/min — ABNORMAL LOW (ref >60)
GFR, Estimated: 29 mL/min — ABNORMAL LOW (ref >60)
Glucose, Bld: 134 mg/dL — ABNORMAL HIGH (ref 70–99)
Glucose, Bld: 117 mg/dL — ABNORMAL HIGH (ref 70–99)
Potassium: 3.4 mmol/L — ABNORMAL LOW (ref 3.5–5.1)
Potassium: 4.7 mmol/L (ref 3.5–5.1)
Sodium: 123 mmol/L — ABNORMAL LOW (ref 135–145)
Sodium: 125 mmol/L — ABNORMAL LOW (ref 135–145)

## 2023-10-22 LAB — GLUCOSE, CAPILLARY
Glucose-Capillary: 95 mg/dL (ref 70–99)
Glucose-Capillary: 110 mg/dL — ABNORMAL HIGH (ref 70–99)
Glucose-Capillary: 104 mg/dL — ABNORMAL HIGH (ref 70–99)
Glucose-Capillary: 137 mg/dL — ABNORMAL HIGH (ref 70–99)

## 2023-10-22 LAB — MAGNESIUM: Magnesium: 2.3 mg/dL (ref 1.7–2.4)

## 2023-10-22 MED ORDER — METOLAZONE 5 MG PO TABS
5.0000 mg | ORAL_TABLET | Freq: Once | ORAL | Status: AC
  Administered 2023-10-22: 5 mg via ORAL
  Filled 2023-10-22: qty 1

## 2023-10-22 MED ORDER — POTASSIUM CHLORIDE CRYS ER 20 MEQ PO TBCR
40.0000 meq | EXTENDED_RELEASE_TABLET | ORAL | Status: AC
  Administered 2023-10-22 (×2): 40 meq via ORAL
  Filled 2023-10-22 (×2): qty 2

## 2023-10-22 MED ORDER — POTASSIUM CHLORIDE CRYS ER 20 MEQ PO TBCR
40.0000 meq | EXTENDED_RELEASE_TABLET | ORAL | Status: DC
Start: 2023-10-22 — End: 2023-10-22

## 2023-10-22 MED ORDER — TOLVAPTAN 15 MG PO TABS
15.0000 mg | ORAL_TABLET | Freq: Once | ORAL | Status: DC
  Filled 2023-10-22: qty 1

## 2023-10-22 MED ORDER — POTASSIUM CHLORIDE CRYS ER 20 MEQ PO TBCR
40.0000 meq | EXTENDED_RELEASE_TABLET | Freq: Once | ORAL | Status: AC
  Administered 2023-10-22: 40 meq via ORAL
  Filled 2023-10-22: qty 2

## 2023-10-22 MED ORDER — TOLVAPTAN 15 MG PO TABS
30.0000 mg | ORAL_TABLET | Freq: Once | ORAL | Status: AC
  Administered 2023-10-22: 30 mg via ORAL
  Filled 2023-10-22: qty 2

## 2023-10-22 NOTE — Progress Notes (Addendum)
Advanced Heart Failure Rounding Note  PCP-Cardiologist: None   Subjective:   - Echo 10/27 EF 20-25%, LV with GHK, RV mod reduced, mod elevated PASP, LA/RA severely dilated, severe MR/TR - 10/29 DBA cut back  to 2.5 mcg. CVP low. Diuretics held.  - 10/30 hypotensive overnight, NE restarted. A line placed, NE weaned off by midnight - 11/1 RHC 11/1 with R>L failure RA 17 PA 55/28 (39) PCWP 18 Thermo 4.1/1.8 PAPi 1.5  - 11/3 Moved to ICU for hypotension despite DBA 5 (SBP stable in 90s once in unit). Got 1 dose of IV lasix.   Now on DBA 5>7.5, NE off  Urine sodium <10. Started on lasix gtt +metolazone +tolvaptan. Only 2.7L UOP out. Weight down 2lbs.   Co-ox 58%  CVP 16 Scr at 2.4   Sleepy, but feels ok. Has been walking in room.    Objective:   Weight Range: 101.7 kg Body mass index is 31.94 kg/m.   Vital Signs:   Temp:  [96.4 F (35.8 C)-97.6 F (36.4 C)] 97.6 F (36.4 C) (11/05 0000) Pulse Rate:  [72-234] 98 (11/05 0645) Resp:  [8-29] 15 (11/05 0645) BP: (84-145)/(60-128) 102/62 (11/05 0645) SpO2:  [90 %-100 %] 100 % (11/05 0645) Weight:  [101.7 kg] 101.7 kg (11/05 0500) Last BM Date : 10/21/23  Weight change: Filed Weights   10/20/23 0500 10/21/23 0500 10/22/23 0500  Weight: 102.6 kg 102.8 kg 101.7 kg    Intake/Output:   Intake/Output Summary (Last 24 hours) at 10/22/2023 0711 Last data filed at 10/22/2023 0610 Gross per 24 hour  Intake 1319.39 ml  Output 2700 ml  Net -1380.61 ml   Scheduled Meds:  Chlorhexidine Gluconate Cloth  6 each Topical Daily   folic acid  1 mg Oral Daily   heparin  5,000 Units Subcutaneous Q8H   insulin aspart  0-15 Units Subcutaneous TID WC   insulin aspart  0-5 Units Subcutaneous QHS   melatonin  5 mg Oral QHS   multivitamin with minerals  1 tablet Oral Daily   sodium chloride flush  10-40 mL Intracatheter Q12H   thiamine  100 mg Oral Daily   Or   thiamine  100 mg Intravenous Daily   Continuous Infusions:  sodium  chloride Stopped (10/21/23 1113)   DOBUTamine 7.5 mcg/kg/min (10/22/23 0609)   furosemide (LASIX) 200 mg in dextrose 5 % 100 mL (2 mg/mL) infusion 20 mg/hr (10/22/23 0610)   norepinephrine (LEVOPHED) Adult infusion Stopped (10/22/23 0116)   PRN Meds:.sodium chloride, docusate sodium, hydrOXYzine, LORazepam, menthol-cetylpyridinium, mouth rinse, polyethylene glycol, sodium chloride flush   CVP 16 Physical Exam  General:  well appearing.  No respiratory difficulty HEENT: normal Neck: supple. JVD ~16 cm. Carotids 2+ bilat; no bruits. No lymphadenopathy or thyromegaly appreciated. Cor: PMI nondisplaced. Regular rate & rhythm. No rubs, gallops or murmurs. Lungs: clear Abdomen: soft, nontender, nondistended. No hepatosplenomegaly. No bruits or masses. Good bowel sounds. Extremities: no cyanosis, clubbing, rash, edema. PICC RUE Neuro: alert & oriented x 3, cranial nerves grossly intact. moves all 4 extremities w/o difficulty. Affect pleasant.   Telemetry   ST low 100s intt PVCs (Personally reviewed)    Patient Profile   57 year old male with known history of CKD stage IIIa, pulmonary hypertension, and alcohol related dilated cardiomyopathy with chronic combined diastolic and systolic congestive heart failure who presents in cardiogenic shock.   Assessment/Plan  1. Acute on chronic systolic HF -> Cardiogenic shock: Patient presented in cardiogenic shock, hypotensive with mildly  elevated lactate.  He was placed on norepinephrine and subsequently dobutamine with improvement in his lactate.  His Coox was 35% which gives a cardiac index of 1.5 by assumed Fick.   - Longstanding NICM EF < 20%. Normal cors by cath 2012 with EF 20% PAH in setting of ETOH abuse - Echo 2020: EF 20% RV moderately HK severe MR/TR - RHC 11/1 with R>L failure RA 17 PA 55/28 (39) PCWP 18 Thermo 4.1/1.8 PAPi 1.5  - Failed NE wean and moved back to ICU 11/2 for NE support - Today off NE and on DBA 7.5 Co-ox 58%.  - Sluggish  diuresis.  CVP 16. Urine sodium <10.  - Increase lasix gtt @20 >30. Repeat Tolvaptan today. Give metolazone 5 mg today - He has end-stage CM with severe biventricular failure (R>L). Now inotrope dependent - Poor candidate for advanced therapies with ongoing heavy alcohol use, biventricular failure and potential cirrhosis - Considered cMRI to further evaluate but doubt this will change management - Consult Palliative Care. We discussed    2. AKI on CKD: Baseline likely around 1.6-1.9 based on outside labs, elevated to 3.7 on arrival.   - Likely secondary to cardiorenal syndrome/shock - 2.07 -> 2.19 -> 2.24 ->2.4>2.4 today - Plan as above   3. Alcohol abuse: Drinks at least a pint of liquor every day, has not gone without alcohol in many years.  Denies withdrawal symptoms in the past. -Continue CIWA protocol, completed phenobarbital and ativan.  -Thiamine/folate  4. ?Cirrhosis: Based on soft BP, years of heavy alcohol use, and evidence of synthetic dysfunction (platelets, elevated bilirubin) is likely a possibility. PT-INR 18/1.5, can consider abdominal imaging when more stable. - MELD labs ordered - Abdominal imaging when more stable  5. Hyponatremia - poor prognostic marker - limit FW - 123 today.  - Give dose of tolvaptan again  GOC- Palliative Care consult.    Length of Stay: 10  Alen Bleacher, NP  10/22/2023, 7:11 AM  Advanced Heart Failure Team Pager [phone] (M-F; 7a - 5p)  Please contact [facility] for night-coverage after hours (5p -7a ) and weekends on [email or web address]  Patient seen with NP, agree with the above note.   Some diuresis yesterday, I/O net negative 1381 and weight down 2 lbs.  He is on Lasix gtt at 20 mg/hr and had metolazone 2.5 + tolvaptan 15 mg.  Na 123 this morning. Co-ox 58% on dobutamine 7.5.   This morning CVP 20.   No dyspnea at rest, walked yesterday.   General: NAD Neck: JVP 16+, no thyromegaly or thyroid nodule.  Lungs: Clear to auscultation  bilaterally with normal respiratory effort. CV: Lateral PMI.  Heart regular S1/S2, no S3/S4, no murmur.  No peripheral edema.   Abdomen: Soft, nontender, no hepatosplenomegaly, no distention.  Skin: Intact without lesions or rashes.  Neurologic: Alert and oriented x 3.  Psych: Normal affect. Extremities: No clubbing or cyanosis.  HEENT: Normal.   Still marked volume overload. End stage biventricular cardiomyopathy in the setting of ETOH abuse (suspect ETOH cardiomyopathy).  Cardiorenal syndrome.  CVP 20, suspect R>L heart failure.  - Continue dobutamine 7.5, co-ox 58% today.   - Increase Lasix to 30 mg/hr and give metolazone 5 mg x 1 today.   - Hypervolemic hyponatremia, will give tolvaptan 30 mg x 1.    Discussed poor prognosis with patient and family today.  Palliative care consultation.  He is not a candidate for transplant or mechanical support with biventricular failure due to ongoing  ETOH abuse.   CRITICAL CARE Performed by: Marca Ancona   Total critical care time: 45 minutes  Critical care time was exclusive of separately billable procedures and treating other patients.  Critical care was necessary to treat or prevent imminent or life-threatening deterioration.  Critical care was time spent personally by me on the following activities: development of treatment plan with patient and/or surrogate as well as nursing, discussions with consultants, evaluation of patient's response to treatment, examination of patient, obtaining history from patient or surrogate, ordering and performing treatments and interventions, ordering and review of laboratory studies, ordering and review of radiographic studies, pulse oximetry and re-evaluation of patient's condition.  Marca Ancona 10/22/2023 7:42 AM

## 2023-10-22 NOTE — Evaluation (Signed)
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) 05/06/2019   Essential hypertension 05/06/2019   Acute on chronic systolic heart failure (HCC) 05/06/2019   Obesity 05/06/2019   Past Medical History:  Past Medical History:  Diagnosis Date   Alcohol use    CHF (congestive heart failure) (HCC)    Chronic alcohol use    CKD stage 3a, GFR 45-59 ml/min (HCC)    Dilated cardiomyopathy (HCC)    Gout    Hypertension    Personal history of nicotine dependence    Past Surgical History:  Past Surgical History:  Procedure Laterality Date   BIOPSY  08/30/2020   Procedure: BIOPSY;  Surgeon: Jeani Hawking, MD;  Location: [facility];  Service: Endoscopy;;   ESOPHAGOGASTRODUODENOSCOPY (EGD) WITH PROPOFOL N/A 08/30/2020   Procedure: ESOPHAGOGASTRODUODENOSCOPY (EGD) WITH PROPOFOL;  Surgeon: Jeani Hawking, MD;  Location: [facility] ENDOSCOPY;  Service: Endoscopy;  Laterality: N/A;   RIGHT HEART CATH N/A 10/18/2023   Procedure: RIGHT HEART CATH;  Surgeon: Dorthula Nettles, DO;  Location: [facility] INVASIVE CV [facility];  Service: Cardiovascular;  Laterality: N/A;   HPI:  Pt is a 57 y.o. male who presented 10/12/23 with gradually worsening SOB x1 month. Admitted with cardiogenic shock. S/p RHC 11/1. PMH: CKD stage IIIa, pulmonary hypertension, and alcohol related dilated cardiomyopathy with chronic combined diastolic and systolic congestive heart failure, HTN, gout, EtOH use, some tobacco use   Assessment / Plan / Recommendation Clinical Impression  Christopher [name] participated in cognitive/language assessment.  He was alert,  oriented.  Alternating attention during verbal tasks was impaired; selective attention WNL.  He was able to retain short verbal lists after 2 min, then 5 min delay; 2/3 after 10 min delay. Demonstrated adequate working memory and problem-solving when asked to find written information from pt booklet, requiring retention of three-step sequence.  Presented with fluctuating ability to recognize and self-monitor accuracy of performance.  In quiet setting with few distractions, and when given preparation for task at hand (memory, attention), his performance improved.  SLP will follow while admitted to address the aforementioned deficits.  F/U needs to be determined.    SLP Assessment  SLP Recommendation/Assessment: Patient needs continued Speech Lanaguage Pathology Services SLP Visit Diagnosis: Cognitive communication deficit (R41.841)    Recommendations for follow up therapy are one component of a multi-disciplinary discharge planning process, led by the attending physician.  Recommendations may be updated based on patient status, additional functional criteria and insurance authorization.    Follow Up Recommendations  Other (comment) (tba)    Assistance Recommended at Discharge  Frequent or constant Supervision/Assistance  Functional Status Assessment Patient has had a recent decline in their functional status and demonstrates the ability to make significant improvements in function in a reasonable and predictable amount of time.  Frequency and Duration min 1 x/week  2 weeks      SLP Evaluation Cognition  Overall Cognitive Status: Impaired/Different from baseline Arousal/Alertness: Awake/alert Orientation Level: Oriented X4 Attention: Alternating Alternating Attention: Impaired Alternating Attention Impairment: Verbal basic Memory: Impaired Memory Impairment: Retrieval deficit Awareness: Impaired Problem Solving: Appears intact       Comprehension  Auditory Comprehension  Overall  Auditory Comprehension: Appears within functional limits for tasks assessed Yes/No Questions: Within Functional Limits Commands: Within Functional Limits Visual Recognition/Discrimination Discrimination: Within Function Limits Reading Comprehension Reading Status: Within funtional limits    Expression Expression Primary Mode of Expression: Verbal Verbal Expression Overall Verbal Expression: Appears within functional limits for tasks assessed Written Expression Written Expression: Not tested   Oral / Motor  Oral Motor/Sensory Function Overall Oral Motor/Sensory Function: Within functional limits Motor Speech Overall Motor Speech: Appears within functional limits for tasks assessed            Christopher [name] Christopher [name] 10/22/2023, 10:43 AM Christopher [name] L. Christopher Frederic, MA CCC/SLP Clinical Specialist - Acute Care SLP Acute Rehabilitation Services Office number [phone]

## 2023-10-23 ENCOUNTER — Other Ambulatory Visit (HOSPITAL_COMMUNITY): Payer: Self-pay

## 2023-10-23 DIAGNOSIS — R531 Weakness: Secondary | ICD-10-CM

## 2023-10-23 DIAGNOSIS — I5023 Acute on chronic systolic (congestive) heart failure: Secondary | ICD-10-CM | POA: Diagnosis not present

## 2023-10-23 DIAGNOSIS — Z66 Do not resuscitate: Secondary | ICD-10-CM | POA: Diagnosis not present

## 2023-10-23 DIAGNOSIS — N179 Acute kidney failure, unspecified: Secondary | ICD-10-CM | POA: Diagnosis not present

## 2023-10-23 DIAGNOSIS — R570 Cardiogenic shock: Secondary | ICD-10-CM | POA: Diagnosis not present

## 2023-10-23 LAB — COOXEMETRY PANEL
Carboxyhemoglobin: 1.8 % — ABNORMAL HIGH (ref 0.5–1.5)
Methemoglobin: <0.7 % (ref 0.0–1.5)
O2 Saturation: 68 %
Total hemoglobin: 9.6 g/dL — ABNORMAL LOW (ref 12.0–16.0)

## 2023-10-23 LAB — BASIC METABOLIC PANEL
Anion gap: 9 (ref 5–15)
Anion gap: 14 (ref 5–15)
BUN: 50 mg/dL — ABNORMAL HIGH (ref 6–20)
BUN: 54 mg/dL — ABNORMAL HIGH (ref 6–20)
CO2: 22 mmol/L (ref 22–32)
CO2: 22 mmol/L (ref 22–32)
Calcium: 8.6 mg/dL — ABNORMAL LOW (ref 8.9–10.3)
Calcium: 8.9 mg/dL (ref 8.9–10.3)
Chloride: 92 mmol/L — ABNORMAL LOW (ref 98–111)
Chloride: 90 mmol/L — ABNORMAL LOW (ref 98–111)
Creatinine, Ser: 2.33 mg/dL — ABNORMAL HIGH (ref 0.61–1.24)
Creatinine, Ser: 2.35 mg/dL — ABNORMAL HIGH (ref 0.61–1.24)
GFR, Estimated: 31 mL/min — ABNORMAL LOW (ref >60)
GFR, Estimated: 31 mL/min — ABNORMAL LOW (ref >60)
Glucose, Bld: 157 mg/dL — ABNORMAL HIGH (ref 70–99)
Glucose, Bld: 152 mg/dL — ABNORMAL HIGH (ref 70–99)
Potassium: 3.1 mmol/L — ABNORMAL LOW (ref 3.5–5.1)
Potassium: 4.3 mmol/L (ref 3.5–5.1)
Sodium: 123 mmol/L — ABNORMAL LOW (ref 135–145)
Sodium: 126 mmol/L — ABNORMAL LOW (ref 135–145)

## 2023-10-23 LAB — GLUCOSE, CAPILLARY
Glucose-Capillary: 91 mg/dL (ref 70–99)
Glucose-Capillary: 147 mg/dL — ABNORMAL HIGH (ref 70–99)
Glucose-Capillary: 147 mg/dL — ABNORMAL HIGH (ref 70–99)
Glucose-Capillary: 117 mg/dL — ABNORMAL HIGH (ref 70–99)

## 2023-10-23 LAB — MAGNESIUM: Magnesium: 2.1 mg/dL (ref 1.7–2.4)

## 2023-10-23 MED ORDER — POTASSIUM CHLORIDE CRYS ER 20 MEQ PO TBCR
40.0000 meq | EXTENDED_RELEASE_TABLET | Freq: Once | ORAL | Status: AC
  Administered 2023-10-23: 40 meq via ORAL
  Filled 2023-10-23: qty 2

## 2023-10-23 MED ORDER — POTASSIUM CHLORIDE CRYS ER 20 MEQ PO TBCR
20.0000 meq | EXTENDED_RELEASE_TABLET | Freq: Once | ORAL | Status: AC
  Administered 2023-10-23: 20 meq via ORAL

## 2023-10-23 MED ORDER — TOLVAPTAN 15 MG PO TABS
30.0000 mg | ORAL_TABLET | Freq: Once | ORAL | Status: AC
  Administered 2023-10-23: 30 mg via ORAL
  Filled 2023-10-23: qty 2

## 2023-10-23 MED ORDER — METOLAZONE 5 MG PO TABS
5.0000 mg | ORAL_TABLET | Freq: Two times a day (BID) | ORAL | Status: AC
  Administered 2023-10-23 (×2): 5 mg via ORAL
  Filled 2023-10-23 (×2): qty 1

## 2023-10-23 MED ORDER — POTASSIUM CHLORIDE CRYS ER 20 MEQ PO TBCR
20.0000 meq | EXTENDED_RELEASE_TABLET | Freq: Once | ORAL | Status: AC
  Administered 2023-10-23: 20 meq via ORAL
  Filled 2023-10-23: qty 1

## 2023-10-23 NOTE — Progress Notes (Signed)
Physical Therapy Treatment Patient Details Name: Christopher [name] MRN: [id number] DOB: 01-20-1966 Today's Date: 10/23/2023   History of Present Illness Pt is a 57 y.o. male who presented 10/12/23 with gradually worsening SOB x1 month. Admitted with cardiogenic [name]. S/p RHC 11/1. PMH: CKD stage IIIa, pulmonary hypertension, and alcohol related dilated cardiomyopathy with chronic combined diastolic and systolic congestive heart failure, HTN, gout, EtOH use, some tobacco use    PT Comments  Pt continues to progress however remains to have impaired balance and impaired higher level cognition. Acute PT to cont to follow to progress independent function as pt was indep and working PTA.    If plan is discharge home, recommend the following: A little help with walking and/or transfers;A little help with bathing/dressing/bathroom;Assistance with cooking/housework;Direct supervision/assist for medications management;Direct supervision/assist for financial management;Assist for transportation;Help with stairs or ramp for entrance   Can travel by private vehicle     Yes  Equipment Recommendations  Rollator (4 wheels);BSC/3in1    Recommendations for Other Services OT consult     Precautions / Restrictions Precautions Precautions: Fall Precaution Comments: watch HR and BP Restrictions Weight Bearing Restrictions: No     Mobility  Bed Mobility Overal bed mobility: Needs Assistance Bed Mobility: Supine to Sit     Supine to sit: Supervision Sit to supine: Supervision   General bed mobility comments: cues for positioning and safety for lines    Transfers Overall transfer level: Needs assistance Equipment used: None Transfers: Sit to/from Stand Sit to Stand: Supervision           General transfer comment: supervision for safety as pt unaware of amount of lines needing to be addressed    Ambulation/Gait Ambulation/Gait assistance: Min assist Gait Distance (Feet): 370  Feet Assistive device: None Gait Pattern/deviations: Step-through pattern, Step-to pattern, Decreased stride length, Drifts right/left, Scissoring Gait velocity: reduced Gait velocity interpretation: <1.31 ft/sec, indicative of household ambulator   General Gait Details: needing increased support at times when rounding corners, turning head to look to sides and some veering and some scissoring noted with increased assist given at hips. Pt very distracted by environment and talking to people in hallways requiring increased assist to maintain balance   Stairs             Wheelchair Mobility     Tilt Bed    Modified Rankin (Stroke Patients Only)       Balance Overall balance assessment: Needs assistance Sitting-balance support: No upper extremity supported, Feet supported Sitting balance-Leahy Scale: Good Sitting balance - Comments: Able to sit statically EOB with CGA, did display x1 LOB to R with minA to recover   Standing balance support: No upper extremity supported Standing balance-Leahy Scale: Fair Standing balance comment: static balance unaided, needs min A at times for ambulation                            Cognition Arousal: Alert Behavior During Therapy: WFL for tasks assessed/performed Overall Cognitive Status: Impaired/Different from baseline Area of Impairment: Safety/judgement, Problem solving, Awareness, Following commands, Memory, Attention                 Orientation Level: Disoriented to, Time Current Attention Level: Sustained Memory: Decreased short-term memory Following Commands: Follows one step commands consistently, Follows one step commands with increased time Safety/Judgement: Decreased awareness of safety Awareness: Emergent Problem Solving: Slow processing, Requires verbal cues, Difficulty sequencing General Comments: delayed processing, unable to multitask  Exercises      General Comments General comments  (skin integrity, edema, etc.): VSS      Pertinent Vitals/Pain Pain Assessment Pain Assessment: No/denies pain    Home Living                          Prior Function            PT Goals (current goals can now be found in the care plan section) Acute Rehab PT Goals Patient Stated Goal: to improve PT Goal Formulation: With patient Time For Goal Achievement: 11/01/23 Potential to Achieve Goals: Good Progress towards PT goals: Progressing toward goals    Frequency    Min 1X/week      PT Plan      Co-evaluation              AM-PAC PT "6 Clicks" Mobility   Outcome Measure  Help needed turning from your back to your side while in a flat bed without using bedrails?: A Little Help needed moving from lying on your back to sitting on the side of a flat bed without using bedrails?: A Little Help needed moving to and from a bed to a chair (including a wheelchair)?: A Little Help needed standing up from a chair using your arms (e.g., wheelchair or bedside chair)?: A Little Help needed to walk in hospital room?: A Little Help needed climbing 3-5 steps with a railing? : Total 6 Click Score: 16    End of Session Equipment Utilized During Treatment: Gait belt Activity Tolerance: Patient limited by fatigue Patient left: with call bell/phone within reach;with nursing/sitter in room;in chair;with chair alarm set Nurse Communication: Mobility status PT Visit Diagnosis: Other abnormalities of gait and mobility (R26.89);Muscle weakness (generalized) (M62.81)     Time: 4132-4401 PT Time Calculation (min) (ACUTE ONLY): 16 min  Charges:    $Gait Training: 8-22 mins PT General Charges $$ ACUTE PT VISIT: 1 Visit                     Christopher [name], PT, DPT Acute Rehabilitation Services Secure chat preferred Office #: [phone]    Christopher [name] 10/23/2023, 1:45 PM

## 2023-10-23 NOTE — Plan of Care (Signed)

## 2023-10-23 NOTE — Progress Notes (Signed)
Patient ID: Christopher [name], male   DOB: Nov 21, 1966, 57 y.o.   MRN: [id number]    Progress Note from the Palliative Medicine Team at [facility]   Patient Name: Christopher [name]        Date: 10/23/2023 DOB: November 15, 1966  Age: 57 y.o. MRN#: [id number] Attending Physician: Christopher Nettles, DO Primary Care Physician: Christopher Funk, MD (Inactive) Admit Date: 10/12/2023   Reason for Consultation/Follow-up   Establishing Goals of Care   HPI/ Brief Hospital Review  57 y.o. male   admitted on 10/12/2023 with   gradually worsening SOB x1 month.    Admitted with cardiogenic shock. S/p RHC 11/1. PMH: CKD stage IIIa, pulmonary hypertension, and alcohol related dilated cardiomyopathy with chronic combined diastolic and systolic congestive heart failure, HTN, gout, EtOH use, some tobacco use    End-stage biventricular cardiomyopathy sodium/123 today-poor prognostic indicator   Patient faces treatment option decisions, advanced directive decisions and anticipatory care needs.Extensive chart review has been completed prior to meeting with patient/family  including labs, vital signs, imaging, progress/consult notes, orders, medications and available advance directive documents.    Subjective   This NP assessed patient at the bedside as a follow up for palliative medicine needs and emotional support.  Patient is out of bed to the chair, alert and oriented and easily engages with conversation with me today.  Ongoing education regarding the seriousness of his current medical situation, patient verbalizes understanding, he remains open to all offered and available medical interventions to prolong life.  He is open to SNF for short-term rehab when medically stable  Education offered on advanced care planning, today we spoke specifically to CODE STATUS.  He understands and verbalizes desire for no CPR or intubation.  With patient's permission I spoke to his father who is his main support person.   I  shared with him [name]'s decision for DNR/DNI and he supports that decision.  Patient's father also communicated he is unable to care for or have [name] come  to his home on discharge secondary to the fact that his wife has severe dementia and he is the main caregiver, he cannot take care of both.     Plan of Care: - DNR/DNI-documented today  -Continue to treat the treatable, patient remains hopeful for improvement -Ongoing conversations regarding patient's current medical situation and make decisions dependent on outcomes. -SNF for short-term rehab   Education offered today regarding  the importance of continued conversation with family and their  medical providers regarding overall plan of care and treatment options,  ensuring decisions are within the context of the patients values and GOCs.  Questions and concerns addressed   Discussed with primary team and nursing staff via secure chat  Christopher [name]/chaplain with PMT continues to support holistically    Time:   50 minutes  Detailed review of medical records ( labs, imaging, vital signs), medically appropriate exam ( MS, skin, cardiac,  resp)   discussed with treatment team, counseling and education to patient, family, staff, documenting clinical information, medication management, coordination of care    Christopher Creed NP  Palliative Medicine Team Team Phone # [phone] Pager [phone]

## 2023-10-23 NOTE — Progress Notes (Addendum)
Advanced Heart Failure Rounding Note  PCP-Cardiologist: None   Subjective:   - Echo 10/27 EF 20-25%, LV with GHK, RV mod reduced, mod elevated PASP, LA/RA severely dilated, severe MR/TR - 10/29 DBA cut back  to 2.5 mcg. CVP low. Diuretics held.  - 10/30 hypotensive overnight, NE restarted. A line placed, NE weaned off by midnight - 11/1 RHC 11/1 with R>L failure RA 17 PA 55/28 (39) PCWP 18 Thermo 4.1/1.8 PAPi 1.5  - 11/3 Moved to ICU for hypotension despite DBA 5 (SBP stable in 90s once in unit). Got 1 dose of IV lasix.   Now on DBA 7.5 mcg. CO-oX 68%.   Remains in lasix drip 30 mg per hour  +metolazone +tolvaptan. Negative  2.L UOP out. Weight down 6 lbs.  Scr 2.3    Feels ok. Denies SOB. Wants to walk  Objective:   Weight Range: 99.1 kg Body mass index is 31.13 kg/m.   Vital Signs:   Temp:  [97.4 F (36.3 C)-98 F (36.7 C)] 97.5 F (36.4 C) (11/06 0358) Pulse Rate:  [74-220] 90 (11/06 0700) Resp:  [14-21] 20 (11/06 0700) BP: (79-130)/(55-89) 96/85 (11/06 0700) SpO2:  [90 %-100 %] 95 % (11/06 0700) Weight:  [99.1 kg] 99.1 kg (11/06 0500) Last BM Date : 10/21/23  Weight change: Filed Weights   10/21/23 0500 10/22/23 0500 10/23/23 0500  Weight: 102.8 kg 101.7 kg 99.1 kg    Intake/Output:   Intake/Output Summary (Last 24 hours) at 10/23/2023 0803 Last data filed at 10/23/2023 0630 Gross per 24 hour  Intake 1005.45 ml  Output 3250 ml  Net -2244.55 ml   Scheduled Meds:  Chlorhexidine Gluconate Cloth  6 each Topical Daily   folic acid  1 mg Oral Daily   heparin  5,000 Units Subcutaneous Q8H   insulin aspart  0-15 Units Subcutaneous TID WC   insulin aspart  0-5 Units Subcutaneous QHS   melatonin  5 mg Oral QHS   multivitamin with minerals  1 tablet Oral Daily   sodium chloride flush  10-40 mL Intracatheter Q12H   thiamine  100 mg Oral Daily   Or   thiamine  100 mg Intravenous Daily   Continuous Infusions:  DOBUTamine 7.5 mcg/kg/min (10/23/23 0423)    furosemide (LASIX) 200 mg in dextrose 5 % 100 mL (2 mg/mL) infusion 30 mg/hr (10/23/23 0424)   norepinephrine (LEVOPHED) Adult infusion Stopped (10/22/23 0116)   PRN Meds:.docusate sodium, hydrOXYzine, LORazepam, menthol-cetylpyridinium, mouth rinse, polyethylene glycol, sodium chloride flush   CVP 15-16  Physical Exam  General:  No resp difficulty HEENT: normal Neck: supple. JVP to jaw . Carotids 2+ bilat; no bruits. No lymphadenopathy or thryomegaly appreciated. Cor: PMI nondisplaced. Tachy Regular rate & rhythm. No rubs, gallops or murmurs. Lungs: clear Abdomen: soft, nontender, distended. No hepatosplenomegaly. No bruits or masses. Good bowel sounds. Extremities: no cyanosis, clubbing, rash, edema Neuro: alert & orientedx3, cranial nerves grossly intact. moves all 4 extremities w/o difficulty. Affect pleasant  Telemetry   ST with PVCs.   Patient Profile   57 year old male with known history of CKD stage IIIa, pulmonary hypertension, and alcohol related dilated cardiomyopathy with chronic combined diastolic and systolic congestive heart failure who presents in cardiogenic shock.   Assessment/Plan  1. Acute on chronic systolic HF -> Cardiogenic shock: Patient presented in cardiogenic shock, hypotensive with mildly elevated lactate.  He was placed on norepinephrine and subsequently dobutamine with improvement in his lactate.  His Coox was 35% which gives  a cardiac index of 1.5 by assumed Fick.   - Longstanding NICM EF < 20%. Normal cors by cath 2012 with EF 20% PAH in setting of ETOH abuse - Echo 2020: EF 20% RV moderately HK severe MR/TR - RHC 11/1 with R>L failure RA 17 PA 55/28 (39) PCWP 18 Thermo 4.1/1.8 PAPi 1.5  - Failed NE wean and moved back to ICU 11/2 for NE support - Remains DBA 7.5 Co-ox 68%   - CVP 15-16.  Continue lasix drip 30 mg per hour, increase metolazone 5 mg  twice a day.  - Repeat Tolvaptan 30 mg today.  - He has end-stage CM with severe biventricular failure  (R>L). Now inotrope dependent - Poor candidate for advanced therapies with ongoing heavy alcohol use, biventricular failure and potential cirrhosis - Considered cMRI to further evaluate but doubt this will change management - Consult Palliative Care. We discussed    2. AKI on CKD: Baseline likely around 1.6-1.9 based on outside labs, elevated to 3.7 on arrival.   - Likely secondary to cardiorenal syndrome/shock - Stable 2.3  - Plan as above   3. Alcohol abuse: Drinks at least a pint of liquor every day, has not gone without alcohol in many years.  Denies withdrawal symptoms in the past. -Continue CIWA protocol, completed phenobarbital and ativan.  -Thiamine/folate  4. ?Cirrhosis: Based on soft BP, years of heavy alcohol use, and evidence of synthetic dysfunction (platelets, elevated bilirubin) is likely a possibility. PT-INR 18/1.5, can consider abdominal imaging when more stable. - MELD labs ordered - Abdominal imaging when more stable  5. Hyponatremia - poor prognostic marker - limit FW - 123 today.  - Give dose of tolvaptan again  6. GOC-  Palliative Care following. He is requesting full code.     Length of Stay: 11  Amy Clegg, NP  10/23/2023, 8:03 AM  Advanced Heart Failure Team Pager [phone] (M-F; 7a - 5p)  Please contact [facility] for night-coverage after hours (5p -7a ) and weekends on [email or web address]  Patient seen with NP, agree with the above note.   He remains on dobutamine 7.5 with co-ox 68%.  I/Os -2078 with weight down on Lasix 30 mg/hr with a dose of metolazone and tolvaptan yesterday.  CVP 20 today on my read.  Creatinine lower 2.47 => 2.33.  Na still low at 123 though patient with no mental status changes.   He did not get out of bed yesterday.   General: NAD Neck: JVP 16+, no thyromegaly or thyroid nodule.  Lungs: Clear to auscultation bilaterally with normal respiratory effort. CV: Lateral PMI.  Heart regular S1/S2, no S3/S4, 2/6 HSM apex/LLSB.  1+ ankle  edema.  Abdomen: Soft, nontender, no hepatosplenomegaly, no distention.  Skin: Intact without lesions or rashes.  Neurologic: Alert and oriented x 3.  Psych: Normal affect. Extremities: No clubbing or cyanosis.  HEENT: Normal.   Still marked volume overload. End stage biventricular cardiomyopathy in the setting of ETOH abuse (suspect ETOH cardiomyopathy).  Cardiorenal syndrome.  CVP still 20, suspect R>L heart failure.  Reasonable diuresis yesterday with weight down.  - Continue dobutamine 7.5, co-ox 68% today.   - Continue Lasix gtt 30 mg/hr and give metolazone 5 mg bid today.  Replace K.  - Hypervolemic hyponatremia, will give tolvaptan 30 mg x 1 and fluid restrict to 1500 cc.     Poor prognosis. He is not a candidate for transplant or mechanical support with biventricular failure due to ongoing ETOH abuse.  He and  family met with palliative care and he wants to remain full code for now.   CRITICAL CARE Performed by: Marca Ancona  Total critical care time: 40 minutes  Critical care time was exclusive of separately billable procedures and treating other patients.  Critical care was necessary to treat or prevent imminent or life-threatening deterioration.  Critical care was time spent personally by me on the following activities: development of treatment plan with patient and/or surrogate as well as nursing, discussions with consultants, evaluation of patient's response to treatment, examination of patient, obtaining history from patient or surrogate, ordering and performing treatments and interventions, ordering and review of laboratory studies, ordering and review of radiographic studies, pulse oximetry and re-evaluation of patient's condition.  Marca Ancona 10/23/2023 9:01 AM

## 2023-10-23 NOTE — Progress Notes (Signed)
Occupational Therapy Treatment Patient Details Name: Christopher [name] MRN: [id number] DOB: Jul 28, 1966 Today's Date: 10/23/2023   History of present illness Pt is a 57 y.o. male who presented 10/12/23 with gradually worsening SOB x1 month. Admitted with cardiogenic shock. S/p RHC 11/1. PMH: CKD stage IIIa, pulmonary hypertension, and alcohol related dilated cardiomyopathy with chronic combined diastolic and systolic congestive heart failure, HTN, gout, EtOH use, some tobacco use   OT comments  Pt progressing toward established OT goals. Following 3 step commands during ADL this session with min indirect cues and minimal environmental distractions; unable to tolerate distraction. Pt requiring intermittent min A for balance during mobility and supervision while standing at sink during grooming. Will continue to follow. Patient will benefit from continued inpatient follow up therapy, <3 hours/day       If plan is discharge home, recommend the following:  A little help with walking and/or transfers;A little help with bathing/dressing/bathroom;Direct supervision/assist for medications management;Direct supervision/assist for financial management;Assist for transportation   Equipment Recommendations  None recommended by OT    Recommendations for Other Services      Precautions / Restrictions Precautions Precautions: Fall Precaution Comments: watch HR and BP Restrictions Weight Bearing Restrictions: No       Mobility Bed Mobility Overal bed mobility: Needs Assistance Bed Mobility: Supine to Sit, Sit to Supine     Supine to sit: Supervision Sit to supine: Supervision   General bed mobility comments: cues for positioning and safety for lines    Transfers Overall transfer level: Needs assistance Equipment used: None Transfers: Sit to/from Stand Sit to Stand: Supervision           General transfer comment: supervision for safety as pt unaware of amount of lines needing to be  addressed     Balance Overall balance assessment: Needs assistance Sitting-balance support: No upper extremity supported, Feet supported Sitting balance-Leahy Scale: Good     Standing balance support: No upper extremity supported Standing balance-Leahy Scale: Fair Standing balance comment: static balance unaided, needs min A at times for ambulation                           ADL either performed or assessed with clinical judgement   ADL Overall ADL's : Needs assistance/impaired     Grooming: Supervision/safety;Standing;Wash/dry face;Oral care                   Toilet Transfer: Contact guard assist;Minimal assistance;Ambulation Toilet Transfer Details (indicate cue type and reason): Intermittent min A for balance. Needing cues for safety and to ensure lines/leads are not being tugged on         Functional mobility during ADLs: Contact guard assist;Minimal assistance General ADL Comments: intermittent min A for mobility    Extremity/Trunk Assessment Upper Extremity Assessment Upper Extremity Assessment: Generalized weakness   Lower Extremity Assessment Lower Extremity Assessment: Defer to PT evaluation        Vision       Perception     Praxis      Cognition Arousal: Alert Behavior During Therapy: WFL for tasks assessed/performed Overall Cognitive Status: Impaired/Different from baseline Area of Impairment: Safety/judgement, Problem solving, Awareness, Following commands, Memory, Attention                   Current Attention Level: Sustained Memory: Decreased short-term memory Following Commands: Follows one step commands consistently, Follows one step commands with increased time Safety/Judgement: Decreased awareness of safety Awareness:  Emergent Problem Solving: Slow processing, Requires verbal cues, Difficulty sequencing General Comments: benefits from minimized environmental distraction during command following. Able to cfollow up  to 3 step commands with indirect cues.        Exercises      Shoulder Instructions       General Comments VSS    Pertinent Vitals/ Pain       Pain Assessment Pain Assessment: No/denies pain  Home Living                                          Prior Functioning/Environment              Frequency  Min 2X/week        Progress Toward Goals  OT Goals(current goals can now be found in the care plan section)  Progress towards OT goals: Progressing toward goals  Acute Rehab OT Goals Patient Stated Goal: get better OT Goal Formulation: With patient Time For Goal Achievement: 11/02/23 Potential to Achieve Goals: Good ADL Goals Pt Will Perform Grooming: Independently;standing Pt Will Transfer to Toilet: Independently;ambulating Additional ADL Goal #1: Pt will demonstrate anticipatory awareness for safe engagement in ADL. Additional ADL Goal #2: Pt will complete multistep cognitive task with <3 errors.  Plan      Co-evaluation                 AM-PAC OT "6 Clicks" Daily Activity     Outcome Measure   Help from another person eating meals?: None Help from another person taking care of personal grooming?: A Little Help from another person toileting, which includes using toliet, bedpan, or urinal?: A Little Help from another person bathing (including washing, rinsing, drying)?: A Little Help from another person to put on and taking off regular upper body clothing?: A Little Help from another person to put on and taking off regular lower body clothing?: A Little 6 Click Score: 19    End of Session Equipment Utilized During Treatment: Gait belt  OT Visit Diagnosis: Other abnormalities of gait and mobility (R26.89);Muscle weakness (generalized) (M62.81);Other symptoms and signs involving cognitive function   Activity Tolerance Patient tolerated treatment well   Patient Left in bed;with call bell/phone within reach;with bed alarm set;with  family/visitor present   Nurse Communication Mobility status        Time: 1610-9604 OT Time Calculation (min): 25 min  Charges: OT General Charges $OT Visit: 1 Visit OT Treatments $Self Care/Home Management : 23-37 mins  Tyler Deis, OTR/L [facility] Acute Rehabilitation Office: [phone]   Myrla Halsted 10/23/2023, 4:07 PM

## 2023-10-24 DIAGNOSIS — R570 Cardiogenic shock: Secondary | ICD-10-CM | POA: Diagnosis not present

## 2023-10-24 DIAGNOSIS — I5023 Acute on chronic systolic (congestive) heart failure: Secondary | ICD-10-CM | POA: Diagnosis not present

## 2023-10-24 LAB — COOXEMETRY PANEL
Carboxyhemoglobin: 2 % — ABNORMAL HIGH (ref 0.5–1.5)
Methemoglobin: <0.7 % (ref 0.0–1.5)
O2 Saturation: 61.8 %
Total hemoglobin: 9.6 g/dL — ABNORMAL LOW (ref 12.0–16.0)

## 2023-10-24 LAB — BASIC METABOLIC PANEL
Anion gap: 16 — ABNORMAL HIGH (ref 5–15)
Anion gap: 12 (ref 5–15)
BUN: 52 mg/dL — ABNORMAL HIGH (ref 6–20)
BUN: 55 mg/dL — ABNORMAL HIGH (ref 6–20)
CO2: 20 mmol/L — ABNORMAL LOW (ref 22–32)
CO2: 25 mmol/L (ref 22–32)
Calcium: 8.8 mg/dL — ABNORMAL LOW (ref 8.9–10.3)
Calcium: 8.9 mg/dL (ref 8.9–10.3)
Chloride: 87 mmol/L — ABNORMAL LOW (ref 98–111)
Chloride: 87 mmol/L — ABNORMAL LOW (ref 98–111)
Creatinine, Ser: 2.57 mg/dL — ABNORMAL HIGH (ref 0.61–1.24)
Creatinine, Ser: 2.56 mg/dL — ABNORMAL HIGH (ref 0.61–1.24)
GFR, Estimated: 28 mL/min — ABNORMAL LOW (ref >60)
GFR, Estimated: 28 mL/min — ABNORMAL LOW (ref >60)
Glucose, Bld: 169 mg/dL — ABNORMAL HIGH (ref 70–99)
Glucose, Bld: 138 mg/dL — ABNORMAL HIGH (ref 70–99)
Potassium: 3.8 mmol/L (ref 3.5–5.1)
Potassium: 3.6 mmol/L (ref 3.5–5.1)
Sodium: 123 mmol/L — ABNORMAL LOW (ref 135–145)
Sodium: 124 mmol/L — ABNORMAL LOW (ref 135–145)

## 2023-10-24 LAB — GLUCOSE, CAPILLARY
Glucose-Capillary: 100 mg/dL — ABNORMAL HIGH (ref 70–99)
Glucose-Capillary: 118 mg/dL — ABNORMAL HIGH (ref 70–99)
Glucose-Capillary: 119 mg/dL — ABNORMAL HIGH (ref 70–99)
Glucose-Capillary: 118 mg/dL — ABNORMAL HIGH (ref 70–99)

## 2023-10-24 LAB — MAGNESIUM: Magnesium: 2.3 mg/dL (ref 1.7–2.4)

## 2023-10-24 MED ORDER — TOLVAPTAN 15 MG PO TABS
30.0000 mg | ORAL_TABLET | Freq: Once | ORAL | Status: AC
  Administered 2023-10-24: 30 mg via ORAL
  Filled 2023-10-24: qty 2

## 2023-10-24 MED ORDER — METOLAZONE 5 MG PO TABS
5.0000 mg | ORAL_TABLET | Freq: Two times a day (BID) | ORAL | Status: AC
  Administered 2023-10-24 (×2): 5 mg via ORAL
  Filled 2023-10-24 (×2): qty 1

## 2023-10-24 NOTE — Progress Notes (Signed)
[facility] 2H24 [facility]  Hospice hospital liaison note  Patient is referred to [facility] for interest in hospice services. Met with patient and a cousin in the room. Explained services and hospice philosophy of care. Per request of patient will return tomorrow for follow up conversation.   Thank you for the opportunity to participate in this patient's plan of care.  Thea Gist, BSN RN Hospice hospital liaison  [phone]

## 2023-10-24 NOTE — Progress Notes (Addendum)
Advanced Heart Failure Rounding Note  PCP-Cardiologist: None   Subjective:   - Echo 10/27 EF 20-25%, LV with GHK, RV mod reduced, mod elevated PASP, LA/RA severely dilated, severe MR/TR - 10/29 DBA cut back  to 2.5 mcg. CVP low. Diuretics held.  - 10/30 hypotensive overnight, NE restarted. A line placed, NE weaned off by midnight - 11/1 RHC 11/1 with R>L failure RA 17 PA 55/28 (39) PCWP 18 Thermo 4.1/1.8 PAPi 1.5  - 11/3 Moved to ICU for hypotension despite DBA 5 (SBP stable in 90s once in unit). Got 1 dose of IV lasix.   Now on DBA 7.5 mcg. CO-OX 62%.   Remains in lasix drip 30 mg per hour  +metolazone +tolvaptan. I/O not accurate.    Scr 2.3 >2.6   Denies SOB. Wants to walk.    Objective:   Weight Range: 99.8 kg Body mass index is 31.34 kg/m.   Vital Signs:   Temp:  [97.5 F (36.4 C)-98.3 F (36.8 C)] 98.1 F (36.7 C) (11/07 0500) Pulse Rate:  [72-207] 72 (11/07 0600) Resp:  [14-28] 17 (11/07 0600) BP: (63-115)/(44-98) 115/98 (11/07 0600) SpO2:  [52 %-100 %] 96 % (11/07 0600) Weight:  [99.8 kg] 99.8 kg (11/07 0500) Last BM Date : 10/23/23  Weight change: Filed Weights   10/22/23 0500 10/23/23 0500 10/24/23 0500  Weight: 101.7 kg 99.1 kg 99.8 kg    Intake/Output:   Intake/Output Summary (Last 24 hours) at 10/24/2023 0803 Last data filed at 10/24/2023 0600 Gross per 24 hour  Intake 1498.3 ml  Output 2025 ml  Net -526.7 ml   Scheduled Meds:  Chlorhexidine Gluconate Cloth  6 each Topical Daily   folic acid  1 mg Oral Daily   heparin  5,000 Units Subcutaneous Q8H   melatonin  5 mg Oral QHS   multivitamin with minerals  1 tablet Oral Daily   sodium chloride flush  10-40 mL Intracatheter Q12H   thiamine  100 mg Oral Daily   Or   thiamine  100 mg Intravenous Daily   Continuous Infusions:  DOBUTamine 7.5 mcg/kg/min (10/24/23 0600)   furosemide (LASIX) 200 mg in dextrose 5 % 100 mL (2 mg/mL) infusion 30 mg/hr (10/24/23 0600)   norepinephrine (LEVOPHED)  Adult infusion Stopped (10/22/23 0116)   PRN Meds:.docusate sodium, hydrOXYzine, LORazepam, menthol-cetylpyridinium, mouth rinse, polyethylene glycol, sodium chloride flush   CVP 15-16  Physical Exam  General:  In bed. No resp difficulty HEENT: normal Neck: supple. JVP 15-16. Carotids 2+ bilat; no bruits. No lymphadenopathy or thryomegaly appreciated. Cor: PMI nondisplaced. Regular rate & rhythm. No rubs, gallops or murmurs. Lungs: clear Abdomen: soft, nontender, nondistended. No hepatosplenomegaly. No bruits or masses. Good bowel sounds. Extremities: no cyanosis, clubbing, rash, edema Neuro: alert & orientedx3, cranial nerves grossly intact. moves all 4 extremities w/o difficulty. Affect pleasant  Telemetry  ST with PVCs.   Patient Profile   57 year old male with known history of CKD stage IIIa, pulmonary hypertension, and alcohol related dilated cardiomyopathy with chronic combined diastolic and systolic congestive heart failure who presents in cardiogenic shock.   Assessment/Plan  1. Acute on chronic systolic HF -> Cardiogenic shock: Patient presented in cardiogenic shock, hypotensive with mildly elevated lactate.  He was placed on norepinephrine and subsequently dobutamine with improvement in his lactate.  His Coox was 35% which gives a cardiac index of 1.5 by assumed Fick.   - Longstanding NICM EF < 20%. Normal cors by cath 2012 with EF 20% PAH  in setting of ETOH abuse - Echo 2020: EF 20% RV moderately HK severe MR/TR - RHC 11/1 with R>L failure RA 17 PA 55/28 (39) PCWP 18 Thermo 4.1/1.8 PAPi 1.5  - Failed NE wean and moved back to ICU 11/2 for NE support.NE stopped on 11/5 - Remains DBA 7.5 Co-ox 62%   - CVP unchanged 15-16. Continue lasix drip 30 mg per hour, continue metolazone 5 mg  twice a day.  - Repeat Tolvaptan 30 mg today.  - He has end-stage CM with severe biventricular failure (R>L). Now inotrope dependent - Poor candidate for advanced therapies with ongoing heavy  alcohol use, biventricular failure and potential cirrhosis - Considered cMRI to further evaluate but doubt this will change management - Consult Palliative Care. Now DNR DNI    2. AKI on CKD: Baseline likely around 1.6-1.9 based on outside labs, elevated to 3.7 on arrival.   - Likely secondary to cardiorenal syndrome/shock -2.35>2.6 - Plan as above   3. Alcohol abuse: Drinks at least a pint of liquor every day, has not gone without alcohol in many years.  Denies withdrawal symptoms in the past. -Continue CIWA protocol, completed phenobarbital and ativan.  -Thiamine/folate  4. ?Cirrhosis: Based on soft BP, years of heavy alcohol use, and evidence of synthetic dysfunction (platelets, elevated bilirubin) is likely a possibility. PT-INR 18/1.5, can consider abdominal imaging when more stable. - MELD labs ordered - Abdominal imaging when more stable  5. Hyponatremia - poor prognostic marker - limit FW - 123 today.  - Give dose of tolvaptan again  6. GOC-  Palliative Care following. DNR/DNI  Consult SW. Family will not be able to take him home.   Length of Stay: 12  Tonye Becket, NP  10/24/2023, 8:03 AM  Advanced Heart Failure Team Pager [phone] (M-F; 7a - 5p)  Please contact [facility] for night-coverage after hours (5p -7a ) and weekends on [email or web address]  Patient seen with NP, agree with the above note.   Creatinine higher at 2.57 today from 2.3.  Co-ox 62% on dobutamine 7.5.  I/Os not fully recorded yesterday, but weight unchanged and CVP still about 20.   He has no complaints.   General: NAD Neck: JVP 16 cm, no thyromegaly or thyroid nodule.  Lungs: Clear to auscultation bilaterally with normal respiratory effort. CV: Lateral PMI.  Heart regular S1/S2, no S3/S4, no murmur.  No peripheral edema.   Abdomen: Soft, nontender, no hepatosplenomegaly, no distention.  Skin: Intact without lesions or rashes.  Neurologic: Alert and oriented x 3.  Psych: Normal  affect. Extremities: No clubbing or cyanosis.  HEENT: Normal.   Still marked volume overload. End stage biventricular cardiomyopathy in the setting of ETOH abuse (suspect ETOH cardiomyopathy).  Cardiorenal syndrome.  CVP still 20, suspect R>L heart failure. Diuresis does not look as effective yesterday.  - Continue dobutamine 7.5, co-ox 62% today.   - Continue Lasix gtt 30 mg/hr and give metolazone 5 mg bid today.  Replace K.  - Hypervolemic hyponatremia, will give tolvaptan 30 mg x 1 and fluid restrict to 1500 cc.     Poor prognosis. He is not a candidate for transplant or mechanical support with biventricular failure due to ongoing ETOH abuse.  Ongoing discussions with palliative care, now DNR.  He is not going to be able to go home (father not able to care for him and mother with severe dementia).  Unlikely to be able to go to SNF on dobutamine.  I am not sure that we  will be able to fully diurese him or get him effectively off dobutamine.  I think we need to continue discussions about hospice care, ?[facility].  He tells me that he is "ready to leave."   CRITICAL CARE Performed by: Marca Ancona  Total critical care time: 35 minutes  Critical care time was exclusive of separately billable procedures and treating other patients.  Critical care was necessary to treat or prevent imminent or life-threatening deterioration.  Critical care was time spent personally by me on the following activities: development of treatment plan with patient and/or surrogate as well as nursing, discussions with consultants, evaluation of patient's response to treatment, examination of patient, obtaining history from patient or surrogate, ordering and performing treatments and interventions, ordering and review of laboratory studies, ordering and review of radiographic studies, pulse oximetry and re-evaluation of patient's condition.  Marca Ancona 10/24/2023 10:07 AM

## 2023-10-24 NOTE — Progress Notes (Signed)
[facility] hospice team at bedside to talk with patient. Will follow up with patient tomorrow.

## 2023-10-24 NOTE — TOC Progression Note (Signed)
Transition of Care [facility]) - Progression Note    Patient Details  Name: Christopher [name] MRN: [id number] Date of Birth: Feb 24, 1966  Transition of Care [facility]) CM/SW Contact  Lockie Pares, RN Phone Number: 10/24/2023, 12:37 PM  Clinical Narrative:      [name] palliative NP yesterday. Discussion with Dr Shirlee Latch today about poor chance of recovery, not a candidate for transplant etc due to ETOH. Discussion with providers , TOC and nursing regarding consult for residential hospice, as patient cannot go home with parents. Father is the main caregiver of mother who has advanced dementia. Reached out to [facility] representative for assistance with possible transitioning to residential hospice.  Expected Discharge Plan: Home/Self Care Barriers to Discharge: Continued Medical Work up  Expected Discharge Plan and Services   Discharge Planning Services: CM Consult   Living arrangements for the past 2 months: Apartment                                       Social Determinants of Health (SDOH) Interventions SDOH Screenings   Food Insecurity: No Food Insecurity (10/12/2023)  Housing: Medium Risk (10/12/2023)  Transportation Needs: No Transportation Needs (10/12/2023)  Utilities: Not At Risk (10/12/2023)  Financial Resource Strain: Low Risk  (05/28/2023)   Received from [facility]  Tobacco Use: Medium Risk (10/12/2023)    Readmission Risk Interventions     No data to display

## 2023-10-24 NOTE — Plan of Care (Signed)

## 2023-10-24 NOTE — Progress Notes (Signed)
Post fall note  Read Drivers going to bedside due to pt removing leads, Read Drivers heard pt scooting in chair, before she could intervene pt slid out of chair onto butt. No injuries. No pt complaint./

## 2023-10-24 NOTE — Progress Notes (Signed)
This chaplain is present for F/U spiritual care with the Pt. The Pt. cousin is at the bedside visiting with the Pt. along with the RN.   Through storytelling, the cousin and the Pt. affirm the number of family members and how close they are to each other.  The chaplain affirmed a similar   conversation during the last visit with the Pt.  This chaplain is available for F/U spiritual care as needed.  Chaplain Stephanie Acre [phone]

## 2023-10-25 ENCOUNTER — Inpatient Hospital Stay (HOSPITAL_COMMUNITY): Payer: Commercial Managed Care - PPO

## 2023-10-25 DIAGNOSIS — I5084 End stage heart failure: Secondary | ICD-10-CM | POA: Diagnosis not present

## 2023-10-25 DIAGNOSIS — R570 Cardiogenic shock: Secondary | ICD-10-CM | POA: Diagnosis not present

## 2023-10-25 LAB — BASIC METABOLIC PANEL
Anion gap: 17 — ABNORMAL HIGH (ref 5–15)
Anion gap: 14 (ref 5–15)
BUN: 58 mg/dL — ABNORMAL HIGH (ref 6–20)
BUN: 56 mg/dL — ABNORMAL HIGH (ref 6–20)
CO2: 24 mmol/L (ref 22–32)
CO2: 25 mmol/L (ref 22–32)
Calcium: 8.3 mg/dL — ABNORMAL LOW (ref 8.9–10.3)
Calcium: 8.6 mg/dL — ABNORMAL LOW (ref 8.9–10.3)
Chloride: 84 mmol/L — ABNORMAL LOW (ref 98–111)
Chloride: 83 mmol/L — ABNORMAL LOW (ref 98–111)
Creatinine, Ser: 2.38 mg/dL — ABNORMAL HIGH (ref 0.61–1.24)
Creatinine, Ser: 2.7 mg/dL — ABNORMAL HIGH (ref 0.61–1.24)
GFR, Estimated: 31 mL/min — ABNORMAL LOW (ref >60)
GFR, Estimated: 26 mL/min — ABNORMAL LOW (ref >60)
Glucose, Bld: 120 mg/dL — ABNORMAL HIGH (ref 70–99)
Glucose, Bld: 149 mg/dL — ABNORMAL HIGH (ref 70–99)
Potassium: 2.7 mmol/L — CL (ref 3.5–5.1)
Potassium: 3.4 mmol/L — ABNORMAL LOW (ref 3.5–5.1)
Sodium: 125 mmol/L — ABNORMAL LOW (ref 135–145)
Sodium: 122 mmol/L — ABNORMAL LOW (ref 135–145)

## 2023-10-25 LAB — COOXEMETRY PANEL
Carboxyhemoglobin: 2.1 % — ABNORMAL HIGH (ref 0.5–1.5)
Methemoglobin: <0.7 % (ref 0.0–1.5)
O2 Saturation: 56.2 %
Total hemoglobin: 9.5 g/dL — ABNORMAL LOW (ref 12.0–16.0)

## 2023-10-25 LAB — GLUCOSE, CAPILLARY
Glucose-Capillary: 98 mg/dL (ref 70–99)
Glucose-Capillary: 132 mg/dL — ABNORMAL HIGH (ref 70–99)

## 2023-10-25 LAB — MAGNESIUM: Magnesium: 2.9 mg/dL — ABNORMAL HIGH (ref 1.7–2.4)

## 2023-10-25 MED ORDER — POTASSIUM CHLORIDE 10 MEQ/50ML IV SOLN
10.0000 meq | INTRAVENOUS | Status: AC
  Administered 2023-10-25 (×2): 10 meq via INTRAVENOUS
  Filled 2023-10-25 (×2): qty 50

## 2023-10-25 MED ORDER — AMIODARONE HCL IN DEXTROSE 360-4.14 MG/200ML-% IV SOLN: 60.0000 mg/h | INTRAVENOUS | Status: DC

## 2023-10-25 MED ORDER — POTASSIUM CHLORIDE CRYS ER 20 MEQ PO TBCR: 40.0000 meq | EXTENDED_RELEASE_TABLET | Freq: Once | ORAL | Status: DC

## 2023-10-25 MED ORDER — POTASSIUM CHLORIDE CRYS ER 20 MEQ PO TBCR
40.0000 meq | EXTENDED_RELEASE_TABLET | ORAL | Status: DC
  Administered 2023-10-25: 40 meq via ORAL
  Filled 2023-10-25: qty 2

## 2023-10-25 MED ORDER — POTASSIUM CHLORIDE CRYS ER 20 MEQ PO TBCR
40.0000 meq | EXTENDED_RELEASE_TABLET | ORAL | Status: AC
  Administered 2023-10-25 (×2): 40 meq via ORAL
  Filled 2023-10-25 (×2): qty 2

## 2023-10-25 MED ORDER — METOLAZONE 5 MG PO TABS
5.0000 mg | ORAL_TABLET | Freq: Two times a day (BID) | ORAL | Status: AC
  Administered 2023-10-25: 5 mg via ORAL
  Filled 2023-10-25: qty 1

## 2023-10-25 MED ORDER — MAGNESIUM SULFATE 2 GM/50ML IV SOLN
2.0000 g | Freq: Once | INTRAVENOUS | Status: AC
  Administered 2023-10-25: 2 g via INTRAVENOUS
  Filled 2023-10-25: qty 50

## 2023-10-25 MED ORDER — AMIODARONE HCL IN DEXTROSE 360-4.14 MG/200ML-% IV SOLN
60.0000 mg/h | INTRAVENOUS | Status: DC
  Administered 2023-10-25 – 2023-10-26 (×4): 60 mg/h via INTRAVENOUS
  Filled 2023-10-25 (×4): qty 200

## 2023-10-25 MED ORDER — AMIODARONE LOAD VIA INFUSION: 150.0000 mg | Freq: Once | INTRAVENOUS | Status: AC

## 2023-10-25 MED ORDER — BENZONATATE 100 MG PO CAPS
200.0000 mg | ORAL_CAPSULE | Freq: Three times a day (TID) | ORAL | Status: DC | PRN
  Administered 2023-10-25 – 2023-10-26 (×2): 200 mg via ORAL
  Filled 2023-10-25 (×2): qty 2

## 2023-10-25 MED ORDER — TOLVAPTAN 15 MG PO TABS
30.0000 mg | ORAL_TABLET | Freq: Once | ORAL | Status: DC
  Filled 2023-10-25: qty 2

## 2023-10-25 MED ORDER — AMIODARONE HCL IN DEXTROSE 360-4.14 MG/200ML-% IV SOLN
INTRAVENOUS | Status: AC
  Administered 2023-10-25: 150 mg via INTRAVENOUS
  Filled 2023-10-25: qty 200

## 2023-10-25 NOTE — Progress Notes (Signed)
VAST consult received regarding purple lumen "clotted". This nurse went to assess line. Noted line to be at 0cm. When tension placed on line, flushed with great blood return. Performed drsg change and PICC line straightened out to 3cm, flushing without difficulty with great blood return. PICC nurse Danny RN reviewed current chest x-ray and instructed to have MD order chest x-ray to check PICC tip placement. PICC may need to be exchanged, will confirm with new chest x-ray. This nurse secure chatted Dr. Donnella Bi and Misty Stanley RN with Nancy Fetter RN VAST included. Tomasita Morrow, RN VAST

## 2023-10-25 NOTE — Progress Notes (Signed)
[facility] 2H24 [facility]  Hospice hospital liaison note   Met with patient and family to confirm interest in bed at [facility]. Hospice eligibility confirmed. Family agreeable for transfer tomorrow.   Thank you for the opportunity to participate in this patient's plan of care.   [facility] Hospice hospital liaison    [phone]

## 2023-10-25 NOTE — Progress Notes (Addendum)
Patient transitioned to new unit 11/8, PT orders were not present, OT placed new PT consult.  Patient seen by PT 11/8 on 2H with note stating continued acute PT for a minimum on 1x/wk.  Unsure why PT orders were not present.    10/25/2023  RP, OTR/L  Acute Rehabilitation Services  Office:  [phone]

## 2023-10-25 NOTE — Progress Notes (Addendum)
Advanced Heart Failure Rounding Note  PCP-Cardiologist: None   Subjective:   - Echo 10/27 EF 20-25%, LV with GHK, RV mod reduced, mod elevated PASP, LA/RA severely dilated, severe MR/TR - 10/29 DBA cut back  to 2.5 mcg. CVP low. Diuretics held.  - 10/30 hypotensive overnight, NE restarted. A line placed, NE weaned off by midnight - 11/1 RHC 11/1 with R>L failure RA 17 PA 55/28 (39) PCWP 18 Thermo 4.1/1.8 PAPi 1.5  - 11/3 Moved to ICU for hypotension despite DBA 5 (SBP stable in 90s once in unit). Got 1 dose of IV lasix.   Now on DBA 7.5 mcg. CO-OX 56%.   Remains in lasix drip 30 mg per hour  +metolazone + tolvaptan. I/O not accurate, multiple occurrences. Weight down 3 lbs.    Scr 2.3 >2.6   Walks to walk. Denies CP/SOB   Objective:   Weight Range: 98.6 kg Body mass index is 30.97 kg/m.   Vital Signs:   Temp:  [97.2 F (36.2 C)-99.1 F (37.3 C)] 99.1 F (37.3 C) (11/08 0346) Pulse Rate:  [79-236] 80 (11/08 0800) Resp:  [14-23] 16 (11/08 0800) BP: (83-162)/(61-148) 106/65 (11/08 0800) SpO2:  [94 %-100 %] 100 % (11/08 0800) Weight:  [98.6 kg] 98.6 kg (11/08 0426) Last BM Date : 10/24/23  Weight change: Filed Weights   10/23/23 0500 10/24/23 0500 10/25/23 0426  Weight: 99.1 kg 99.8 kg 98.6 kg    Intake/Output:   Intake/Output Summary (Last 24 hours) at 10/25/2023 0856 Last data filed at 10/25/2023 0800 Gross per 24 hour  Intake 1622.38 ml  Output 2072 ml  Net -449.62 ml   Scheduled Meds:  Chlorhexidine Gluconate Cloth  6 each Topical Daily   folic acid  1 mg Oral Daily   heparin  5,000 Units Subcutaneous Q8H   melatonin  5 mg Oral QHS   multivitamin with minerals  1 tablet Oral Daily   sodium chloride flush  10-40 mL Intracatheter Q12H   thiamine  100 mg Oral Daily   Or   thiamine  100 mg Intravenous Daily   Continuous Infusions:  DOBUTamine 7.5 mcg/kg/min (10/25/23 0800)   furosemide (LASIX) 200 mg in dextrose 5 % 100 mL (2 mg/mL) infusion 30 mg/hr  (10/25/23 0800)   norepinephrine (LEVOPHED) Adult infusion Stopped (10/22/23 0116)   PRN Meds:.docusate sodium, hydrOXYzine, LORazepam, menthol-cetylpyridinium, mouth rinse, polyethylene glycol, sodium chloride flush   CVP 20 Physical Exam  General:  well appearing.  No respiratory difficulty HEENT: normal Neck: supple. JVD to jaw. Carotids 2+ bilat; no bruits. No lymphadenopathy or thyromegaly appreciated. Cor: PMI nondisplaced. Regular rate & rhythm. No rubs, gallops or murmurs. Lungs: clear Abdomen: soft, nontender, nondistended. No hepatosplenomegaly. No bruits or masses. Good bowel sounds. Extremities: no cyanosis, clubbing, rash, non-pittine BLE edema. RUE PICC Neuro: alert & oriented x 3, cranial nerves grossly intact. moves all 4 extremities w/o difficulty. Affect pleasant.   Telemetry   ST with PVCs. 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   Assessment/Plan  1. Acute on chronic systolic HF -> Cardiogenic shock: Patient presented in cardiogenic shock, hypotensive with mildly elevated lactate.  He was placed on norepinephrine and subsequently dobutamine with improvement in his lactate.  His Coox was 35% which gives a cardiac index of 1.5 by assumed  Fick.   - Longstanding NICM EF < 20%. Normal cors by cath 2012 with EF 20% PAH in setting of ETOH abuse - Echo 2020: EF 20% RV moderately HK severe MR/TR - RHC 11/1 with R>L failure RA 17 PA 55/28 (39) PCWP 18 Thermo 4.1/1.8 PAPi 1.5  - Failed NE wean and moved back to ICU 11/2 for NE support.NE stopped on 11/5 - Remains DBA 7.5 Co-ox 56%   - CVP 20 Continue lasix drip 30 mg per hour, continue metolazone 5 mg twice a day.  - He has end-stage CM with severe biventricular failure (R>L). Now inotrope  dependent - Poor candidate for advanced therapies with ongoing heavy alcohol use, biventricular failure and potential cirrhosis - Considered cMRI to further evaluate but doubt this will change management - Consult Palliative Care. Now DNR DNI    2. AKI on CKD: Baseline likely around 1.6-1.9 based on outside labs, elevated to 3.7 on arrival.   - Likely secondary to cardiorenal syndrome/shock - 2.35>2.6>2.56 - Plan as above   3. Alcohol abuse: Drinks at least a pint of liquor every day, has not gone without alcohol in many years.  Denies withdrawal symptoms in the past. -Continue CIWA protocol, completed phenobarbital and ativan.  -Thiamine/folate  4. ?Cirrhosis: Based on soft BP, years of heavy alcohol use, and evidence of synthetic dysfunction (platelets, elevated bilirubin) is likely a possibility. PT-INR 18/1.5, can consider abdominal imaging when more stable. - MELD labs ordered - Abdominal imaging when more stable  5. Hyponatremia - poor prognostic marker - limit FW - BMET stat, 124 yesterday - May need more tolvaptan  6. GOC-  Palliative Care following. DNR/DNI  Patient with set DBA dose and goal is just diuresis. Will transfer to floor. Otherwise stable .   SW following. Family will not be able to take him home. Awaiting possible placement at [facility].   Length of Stay: 13  Alen Bleacher, NP  10/25/2023, 8:56 AM  Advanced Heart Failure Team Pager [phone] (M-F; 7a - 5p)  Please contact [facility] for night-coverage after hours (5p -7a ) and weekends on [email or web address]  Patient seen with NP, agree with the above note.   CVP remains 20, co-ox 56% on dobutamine 7.5.  He does not appear to have diuresed as well yesterday by I/Os though weight down 3 lbs. No labs yet.    He is tired of being in the hospital.   General: NAD Neck: JVP 16+, no thyromegaly or thyroid nodule.  Lungs: Clear to auscultation bilaterally with normal respiratory effort. CV: Nondisplaced PMI.   Heart regular S1/S2 with PVCs, no S3/S4, no murmur.  No peripheral edema.   Abdomen: Soft, nontender, no hepatosplenomegaly, no distention.  Skin: Intact without lesions or rashes.  Neurologic: Alert and oriented x 3.  Psych: Normal affect. Extremities: No clubbing or cyanosis.  HEENT: Normal.   Still marked volume overload. End stage biventricular cardiomyopathy in the setting of ETOH abuse (suspect ETOH cardiomyopathy).  Cardiorenal syndrome.  CVP still 20, suspect R>L heart failure. Diuresis does not look as effective yesterday.  - Continue dobutamine 7.5, co-ox 56% today.   - Continue Lasix gtt 30 mg/hr.  Awaiting morning BMET, will likely give metolazone 5 mg bid and consider acetazolamide.  - Hypervolemic hyponatremia, if Na remains low on morning BMET will give tolvaptan again.      Poor prognosis, end stage HF with renal failure. He is not a candidate for transplant or mechanical support with biventricular failure due  to ongoing ETOH abuse.  Ongoing discussions with palliative care, now DNR.  He is not going to be able to go home (father not able to care for him and mother with severe dementia).  Unlikely to be able to go to SNF on dobutamine.  I do not think that we are going to be able to fully diurese him or get him effectively off dobutamine.  He has spoken with palliative care service and he and his family are interested in hospice care at [facility].  Will continue discussions today.   CRITICAL CARE Performed by: Marca Ancona  Total critical care time: 40 minutes  Critical care time was exclusive of separately billable procedures and treating other patients.  Critical care was necessary to treat or prevent imminent or life-threatening deterioration.  Critical care was time spent personally by me on the following activities: development of treatment plan with patient and/or surrogate as well as nursing, discussions with consultants, evaluation of patient's response to  treatment, examination of patient, obtaining history from patient or surrogate, ordering and performing treatments and interventions, ordering and review of laboratory studies, ordering and review of radiographic studies, pulse oximetry and re-evaluation of patient's condition.  Marca Ancona 10/25/2023 9:23 AM

## 2023-10-25 NOTE — Plan of Care (Signed)
  Problem: Coping: Goal: Ability to adjust to condition or change in health will improve Outcome: Progressing   Problem: Health Behavior/Discharge Planning: Goal: Ability to identify and utilize available resources and services will improve Outcome: Progressing Goal: Ability to manage health-related needs will improve Outcome: Progressing   Problem: Skin Integrity: Goal: Risk for impaired skin integrity will decrease Outcome: Progressing   Problem: Coping: Goal: Level of anxiety will decrease Outcome: Progressing   Problem: Elimination: Goal: Will not experience complications related to urinary retention Outcome: Progressing

## 2023-10-25 NOTE — Progress Notes (Signed)
This chaplain is present at the bedside for F/U spiritual care with the Pt. and the Pt. cousins.   The chaplain sat beside the Pt. and the Pt. greeted the chaplain with "my heart is not getting any better" "I wanted to walk today, but my heart was not strong enough."  The chaplain asked the Pt. how he felt when he heard those statements, the Pt. responded "I am ready to go (spiritual) home."  The Pt. cousins shared the Pt. told them the same thing.  The Pt. is more lethargic today, often closing his eyes during the visit. When the chaplain asked about the Pt. pain, the Pt. shares he is without pain "not like some people who have a heart attack."    The chaplain asked the Pt. what he remembered about the visit from Hospice. The Pt. repeated "I am ready to go home."  The Pt. cousin added "God will determine when."  The chaplain clarified the Hospice focus on Pt. comfort and stopping curative care. The chaplain understands family will continue to be by the Pt. side.  The chaplain updated the PMT and is available for F/U spiritual care as needed.  Chaplain Stephanie Acre [phone]

## 2023-10-25 NOTE — Progress Notes (Signed)
Physical Therapy Treatment Patient Details Name: Christopher [name] MRN: [id number] DOB: [birth year] Today's Date: 10/25/2023   History of Present Illness Pt is a 57 y.o. male adm 10/12/23 with worsening SOB x1 month, cardiogenic shock. S/p RHC 11/1 with hypotension and moved to ICU. PMH: CKD stage IIIa, pulmonary HTN, ETOH related dilated cardiomyopathy with chronic combined diastolic and systolic CHF, HTN, gout, EtOH use, some tobacco use    PT Comments  Pt pleasant and eager for OOB activities. Pt resting HR 107 when starting session, became tachycardic 160-197 seated EOB. Resting in seated EOB would drop 110-143. Completed LE exercise EOB with HR rising back to 160-180. Pt was stood and completed lateral shuffles to reposition higher in bed HR rising >170. Returned to bed and put in seated position to eat lunch. Pt asymptomatic throughout. Nursing made aware and with pt at end of session.     If plan is discharge home, recommend the following: A little help with walking and/or transfers;A little help with bathing/dressing/bathroom;Assistance with cooking/housework;Direct supervision/assist for medications management;Direct supervision/assist for financial management;Assist for transportation;Help with stairs or ramp for entrance   Can travel by private vehicle     Yes  Equipment Recommendations  Rollator (4 wheels);BSC/3in1    Recommendations for Other Services       Precautions / Restrictions Precautions Precautions: Fall Precaution Comments: watch HR and BP Restrictions Weight Bearing Restrictions: No     Mobility  Bed Mobility Overal bed mobility: Needs Assistance Bed Mobility: Supine to Sit, Sit to Supine     Supine to sit: Supervision Sit to supine: Supervision   General bed mobility comments: cues for positioning and safety for lines    Transfers Overall transfer level: Needs assistance Equipment used: None Transfers: Sit to/from Stand Sit to Stand: Supervision            General transfer comment: supervision for line management    Ambulation/Gait               General Gait Details: did not ambulate as pt was tachycardic 160-190s seated EOB   Stairs             Wheelchair Mobility     Tilt Bed    Modified Rankin (Stroke Patients Only)       Balance Overall balance assessment: Needs assistance Sitting-balance support: No upper extremity supported, Feet supported, Feet unsupported Sitting balance-Leahy Scale: Good Sitting balance - Comments: Seated EOB supervison for line management, completed LE exercises with no LOB   Standing balance support: No upper extremity supported Standing balance-Leahy Scale: Fair Standing balance comment: completed 3 lateral shuffles to reposition higher in bed                            Cognition Arousal: Alert Behavior During Therapy: WFL for tasks assessed/performed Overall Cognitive Status: Within Functional Limits for tasks assessed                         Following Commands: Follows one step commands consistently, Follows multi-step commands consistently                Exercises General Exercises - Lower Extremity Long Arc Quad: AROM, Both, 10 reps Hip Flexion/Marching: AROM, Both, 10 reps    General Comments        Pertinent Vitals/Pain Pain Assessment Pain Assessment: No/denies pain    Home Living  Prior Function            PT Goals (current goals can now be found in the care plan section) Acute Rehab PT Goals Patient Stated Goal: to improve PT Goal Formulation: With patient Time For Goal Achievement: 11/01/23 Potential to Achieve Goals: Good Progress towards PT goals: PT to reassess next treatment    Frequency    Min 1X/week      PT Plan      Co-evaluation              AM-PAC PT "6 Clicks" Mobility   Outcome Measure  Help needed turning from your back to your side while in a flat  bed without using bedrails?: A Little Help needed moving from lying on your back to sitting on the side of a flat bed without using bedrails?: A Little Help needed moving to and from a bed to a chair (including a wheelchair)?: A Little Help needed standing up from a chair using your arms (e.g., wheelchair or bedside chair)?: A Little Help needed to walk in hospital room?: A Little Help needed climbing 3-5 steps with a railing? : Total 6 Click Score: 16    End of Session   Activity Tolerance: Treatment limited secondary to medical complications (Comment) Patient left: in bed;with call bell/phone within reach;with nursing/sitter in room;with bed alarm set Nurse Communication: Mobility status PT Visit Diagnosis: Other abnormalities of gait and mobility (R26.89);Muscle weakness (generalized) (M62.81)     Time: 1104-1130 PT Time Calculation (min) (ACUTE ONLY): 26 min  Charges:    $Therapeutic Exercise: 8-22 mins $Therapeutic Activity: 8-22 mins PT General Charges $$ ACUTE PT VISIT: 1 Visit                     Andrey Farmer SPT Secure chat preferred    Darlin Drop 10/25/2023, 11:52 AM

## 2023-10-25 NOTE — Progress Notes (Signed)
Occupational Therapy Treatment Patient Details Name: Christopher [name] MRN: [id number] DOB: 1966-02-17 Today's Date: 10/25/2023   History of present illness Pt is a 57 y.o. male adm 10/12/23 with worsening SOB x1 month, cardiogenic shock. S/p RHC 11/1 with hypotension and moved to ICU. PMH: CKD stage IIIa, pulmonary HTN, ETOH related dilated cardiomyopathy with chronic combined diastolic and systolic CHF, HTN, gout, EtOH use, some tobacco use   OT comments  Patient HR ranging between 89-101 during session.  Able to walk in room/toileting and about 100' in the halls with Min A and poor dynamic balance.  Min A for ADL from a sit to stand level due to weakness, decreased balance and poor safety.  OT will continue efforts in the acute setting and continue to recommend SNF for now unless he demonstrates improvement, and has adequate assit/supervision initially.        If plan is discharge home, recommend the following:  A little help with walking and/or transfers;A little help with bathing/dressing/bathroom;Direct supervision/assist for medications management;Direct supervision/assist for financial management;Assist for transportation   Equipment Recommendations  None recommended by OT    Recommendations for Other Services      Precautions / Restrictions Precautions Precautions: Fall Precaution Comments: watch HR and BP Restrictions Weight Bearing Restrictions: No       Mobility Bed Mobility Overal bed mobility: Needs Assistance Bed Mobility: Supine to Sit, Sit to Supine     Supine to sit: Supervision Sit to supine: Supervision   General bed mobility comments: uncontrolled descent for back into bed    Transfers Overall transfer level: Needs assistance Equipment used: None Transfers: Sit to/from Stand, Bed to chair/wheelchair/BSC Sit to Stand: Contact guard assist     Step pivot transfers: Min assist     General transfer comment: very unsteady     Balance Overall balance  assessment: Needs assistance Sitting-balance support: Feet supported Sitting balance-Leahy Scale: Fair Sitting balance - Comments: decreased safety   Standing balance support: No upper extremity supported Standing balance-Leahy Scale: Poor Standing balance comment: unsteady, reaching for objects in room and in hall, recommended RW use                           ADL either performed or assessed with clinical judgement   ADL       Grooming: Standing;Wash/dry face;Oral care;Contact guard assist           Upper Body Dressing : Contact guard assist;Sitting   Lower Body Dressing: Minimal assistance;Sit to/from stand   Toilet Transfer: Minimal assistance;Ambulation                  Extremity/Trunk Assessment Upper Extremity Assessment Upper Extremity Assessment: Generalized weakness   Lower Extremity Assessment Lower Extremity Assessment: Defer to PT evaluation   Cervical / Trunk Assessment Cervical / Trunk Assessment: Normal    Vision Patient Visual Report: No change from baseline     Perception Perception Perception: Not tested   Praxis Praxis Praxis: Not tested    Cognition Arousal: Lethargic Behavior During Therapy: WFL for tasks assessed/performed                             Safety/Judgement: Decreased awareness of safety, Decreased awareness of deficits   Problem Solving: Requires verbal cues           Pertinent Vitals/ Pain       Pain Assessment Pain Assessment:  No/denies pain                                                          Frequency  Min 1X/week        Progress Toward Goals  OT Goals(current goals can now be found in the care plan section)  Progress towards OT goals: Progressing toward goals  Acute Rehab OT Goals OT Goal Formulation: With patient Time For Goal Achievement: 11/02/23 Potential to Achieve Goals: Good  Plan      Co-evaluation                 AM-PAC  OT "6 Clicks" Daily Activity     Outcome Measure   Help from another person eating meals?: None Help from another person taking care of personal grooming?: A Little Help from another person toileting, which includes using toliet, bedpan, or urinal?: A Little Help from another person bathing (including washing, rinsing, drying)?: A Little Help from another person to put on and taking off regular upper body clothing?: A Little Help from another person to put on and taking off regular lower body clothing?: A Little 6 Click Score: 19    End of Session Equipment Utilized During Treatment: Gait belt  OT Visit Diagnosis: Other abnormalities of gait and mobility (R26.89);Muscle weakness (generalized) (M62.81);Other symptoms and signs involving cognitive function   Activity Tolerance Patient limited by lethargy   Patient Left in bed;with call bell/phone within reach;with family/visitor present   Nurse Communication Mobility status        Time: 1610-9604 OT Time Calculation (min): 19 min  Charges: OT General Charges $OT Visit: 1 Visit OT Treatments $Self Care/Home Management : 8-22 mins  10/25/2023  RP, OTR/L  Acute Rehabilitation Services  Office:  [phone]   Suzanna Obey 10/25/2023, 4:27 PM

## 2023-10-25 NOTE — Progress Notes (Signed)
OT Cancellation Note  Patient Details Name: Christopher [name] MRN: [id number] DOB: [birth year]   Cancelled Treatment:    Reason Eval/Treat Not Completed: Other (comment).  RN requested to allow patient to sleep this am given difficult evening.  OT will check back in the pm for appropriateness.    Yachet Mattson D Seleny Allbright 10/25/2023, 8:00 AM 10/25/2023  RP, OTR/L  Acute Rehabilitation Services  Office:  [phone]

## 2023-10-25 NOTE — Progress Notes (Signed)
Patient sustained narrow complex VT in 190s with a pulse. He remained conscious. MD paged. Verbal orders for IV amiodarone bolus and continuous infusion at 60 mg/hr given.  HF NP came to bedside to evaluate patient.   RN will continue to monitor patient closely.

## 2023-10-25 NOTE — Progress Notes (Signed)
Chest x-ray result finalized. Per Dannielle Huh RN VAST confirmed placement and states "It is unmoved from previous CXR. No exchange needed." Notified via secure chat patient's and MD. Tomasita Morrow, RN VAST

## 2023-10-26 DIAGNOSIS — R570 Cardiogenic shock: Secondary | ICD-10-CM | POA: Diagnosis not present

## 2023-10-26 LAB — COOXEMETRY PANEL
Carboxyhemoglobin: 0.8 % (ref 0.5–1.5)
Methemoglobin: 0.7 % (ref 0.0–1.5)
O2 Saturation: 43.1 %
Total hemoglobin: 10.5 g/dL — ABNORMAL LOW (ref 12.0–16.0)

## 2023-10-26 LAB — BASIC METABOLIC PANEL
Anion gap: 18 — ABNORMAL HIGH (ref 5–15)
BUN: 61 mg/dL — ABNORMAL HIGH (ref 6–20)
CO2: 20 mmol/L — ABNORMAL LOW (ref 22–32)
Calcium: 9.2 mg/dL (ref 8.9–10.3)
Chloride: 82 mmol/L — ABNORMAL LOW (ref 98–111)
Creatinine, Ser: 3.84 mg/dL — ABNORMAL HIGH (ref 0.61–1.24)
GFR, Estimated: 17 mL/min — ABNORMAL LOW (ref >60)
Glucose, Bld: 146 mg/dL — ABNORMAL HIGH (ref 70–99)
Potassium: 4.9 mmol/L (ref 3.5–5.1)
Sodium: 120 mmol/L — ABNORMAL LOW (ref 135–145)

## 2023-10-26 MED ORDER — BENZONATATE 200 MG PO CAPS: 200.0000 mg | ORAL_CAPSULE | Freq: Three times a day (TID) | ORAL | 0 refills | Status: DC | PRN

## 2023-10-26 MED ORDER — ENSURE ENLIVE PO LIQD
237.0000 mL | Freq: Two times a day (BID) | ORAL | Status: DC
  Administered 2023-10-26: 237 mL via ORAL

## 2023-10-26 MED ORDER — DOBUTAMINE-DEXTROSE 4-5 MG/ML-% IV SOLN
7.5000 ug/kg/min | INTRAVENOUS | 0 refills | Status: DC
  Filled 2023-10-26: qty 15, fill #0

## 2023-10-26 MED ORDER — DOCUSATE SODIUM 100 MG PO CAPS: 100.0000 mg | ORAL_CAPSULE | Freq: Two times a day (BID) | ORAL | 0 refills | Status: DC | PRN

## 2023-10-26 MED ORDER — ENSURE ENLIVE PO LIQD: 237.0000 mL | Freq: Two times a day (BID) | ORAL | 12 refills | Status: DC

## 2023-10-26 MED ORDER — FUROSEMIDE 80 MG PO TABS: 80.0000 mg | ORAL_TABLET | Freq: Two times a day (BID) | ORAL | 1 refills | Status: DC

## 2023-10-26 NOTE — Plan of Care (Signed)
  Problem: Coping: Goal: Ability to adjust to condition or change in health will improve Outcome: Not Progressing   Problem: Tissue Perfusion: Goal: Adequacy of tissue perfusion will improve Outcome: Not Progressing   Problem: Education: Goal: Knowledge of General Education information will improve Description: Including pain rating scale, medication(s)/side effects and non-pharmacologic comfort measures Outcome: Progressing   Problem: Clinical Measurements: Goal: Cardiovascular complication will be avoided Outcome: Not Progressing   Problem: Coping: Goal: Level of anxiety will decrease Outcome: Not Progressing

## 2023-10-26 NOTE — Progress Notes (Signed)
Pt's SBP was at 70's. Notified CHF. Will hold the lasix drip for now per Barrett, PA

## 2023-10-26 NOTE — Progress Notes (Signed)
Patient ID: Christopher [name], male   DOB: 1966/10/13, 57 y.o.   MRN: [id number]     Advanced Heart Failure Rounding Note  PCP-Cardiologist: None   Subjective:   - Echo 10/27 EF 20-25%, LV with GHK, RV mod reduced, mod elevated PASP, LA/RA severely dilated, severe MR/TR - 10/29 DBA cut back  to 2.5 mcg. CVP low. Diuretics held.  - 10/30 hypotensive overnight, NE restarted. A line placed, NE weaned off by midnight - 11/1 RHC 11/1 with R>L failure RA 17 PA 55/28 (39) PCWP 18 Thermo 4.1/1.8 PAPi 1.5  - 11/3 Moved to ICU for hypotension despite DBA 5 (SBP stable in 90s once in unit). Got 1 dose of IV lasix.   Patient deteriorated over the last couple of days, no longer responding to diuretics and co-ox now down to 43% despite dobutamine 7.5.  SBP 80s-90s.  Creatinine rising 2.7 => 3.84.   Palliative care service following, plan for transfer to [facility] today.    Objective:   Weight Range: 99.7 kg Body mass index is 30.66 kg/m.   Vital Signs:   Temp:  [97 F (36.1 C)-99.3 F (37.4 C)] 98.1 F (36.7 C) (11/09 0750) Pulse Rate:  [40-237] 87 (11/09 1000) Resp:  [15-25] 17 (11/09 1000) BP: (59-141)/(47-115) 80/69 (11/09 1000) SpO2:  [96 %-100 %] 96 % (11/09 0538) Weight:  [99.3 kg-99.7 kg] 99.7 kg (11/09 0500) Last BM Date : 10/26/23  Weight change: Filed Weights   10/25/23 0426 10/25/23 1510 10/26/23 0500  Weight: 98.6 kg 99.3 kg 99.7 kg    Intake/Output:   Intake/Output Summary (Last 24 hours) at 10/26/2023 1056 Last data filed at 10/26/2023 1000 Gross per 24 hour  Intake 1742.29 ml  Output 600 ml  Net 1142.29 ml   Scheduled Meds:  Chlorhexidine Gluconate Cloth  6 each Topical Daily   feeding supplement  237 mL Oral BID BM   folic acid  1 mg Oral Daily   heparin  5,000 Units Subcutaneous Q8H   melatonin  5 mg Oral QHS   multivitamin with minerals  1 tablet Oral Daily   sodium chloride flush  10-40 mL Intracatheter Q12H   thiamine  100 mg Oral Daily   Or   thiamine   100 mg Intravenous Daily   Continuous Infusions:  amiodarone 60 mg/hr (10/26/23 0855)   DOBUTamine 7.5 mcg/kg/min (10/25/23 2101)   furosemide (LASIX) 200 mg in dextrose 5 % 100 mL (2 mg/mL) infusion Stopped (10/26/23 0807)   PRN Meds:.benzonatate, docusate sodium, hydrOXYzine, LORazepam, menthol-cetylpyridinium, mouth rinse, polyethylene glycol, sodium chloride flush   Physical Exam  General: NAD Neck: JVP 16+, no thyromegaly or thyroid nodule.  Lungs: Clear to auscultation bilaterally with normal respiratory effort. CV: Lateral PMI.  Heart regular S1/S2, no S3/S4, no murmur.  1+ ankle edema.  Abdomen: Soft, nontender, no hepatosplenomegaly, no distention.  Skin: Intact without lesions or rashes.  Neurologic: Alert and oriented x 3.  Psych: Normal affect. Extremities: No clubbing or cyanosis.  HEENT: Normal.    Telemetry   ST with PVCs. 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   Assessment/Plan  1. Acute on chronic systolic HF -> Cardiogenic shock: Patient presented in cardiogenic shock, hypotensive with mildly elevated lactate.  He was placed on norepinephrine and subsequently dobutamine with improvement  in his lactate.  His Coox was 35% which gives a cardiac index of 1.5 by assumed Fick.   - Longstanding NICM EF < 20%. Normal cors by cath 2012 with EF 20% PAH in setting of ETOH abuse - Echo 2020: EF 20% RV moderately HK severe MR/TR - RHC 11/1 with R>L failure RA 17 PA 55/28 (39) PCWP 18 Thermo 4.1/1.8 PAPi 1.5  - Failed NE wean and moved back to ICU 11/2 for NE support. NE stopped on 11/5.  Remains on DBA 7.5  - Not a candidate for advanced therapies with ongoing heavy alcohol use, biventricular failure and potential cirrhosis - We have been  aggressively diuresing him but he remains markedly volume overloaded and is no longer responding to diuretics.  Creatinine 2.7 => 3.84 and co-ox down to 43%.  - At this point, we do not have effective therapy to offer him.  Palliative care service involved and plan is now comfort care with transfer to [facility].  He is now DNR.  Will plan to continue dobutamine until he gets to [facility] then turn off.  He can get Lasix 80 mg po bid at [facility] for comfort.    2. AKI on CKD: Baseline likely around 1.6-1.9 based on outside labs, cardiorenal syndrome with rise today to 3.84.  Not HD candidate.    3. Alcohol abuse: Drinks at least a pint of liquor every day, has not gone without alcohol in many years.  Denies withdrawal symptoms in the past.  4. ?Cirrhosis: Based on soft BP, years of heavy alcohol use, and evidence of synthetic dysfunction (platelets, elevated bilirubin) is likely a possibility.   5. Hyponatremia - Persistent despite tolvaptan and fluid restriction.  Poor prognostic factor.   6. NSVT - On amiodarone gtt, stop at discharge to [facility].   Plan transfer to [facility] today.  Discussed with patient and family.  Length of Stay: 70  Marca Ancona, MD  10/26/2023, 10:56 AM  Advanced Heart Failure Team Pager [phone] (M-F; 7a - 5p)  Please contact [facility] for night-coverage after hours (5p -7a ) and weekends on [email or web address]

## 2023-10-26 NOTE — TOC Transition Note (Signed)
Transition of Care [facility]) - CM/SW Discharge Note   Patient Details  Name: Christopher [name] MRN: [id number] Date of Birth: [birth year]  Transition of Care [facility] CM/SW Contact:  Patrice Paradise, LCSW Phone Number: 10/26/2023, 10:35 AM   Clinical Narrative:    Patient will DC to:?[facility] Anticipated DC date:?10/26/2023 Transport by: Sharin Mons   Per MD patient ready for DC to [facility]. RN, patient, patient's family, and facility notified of DC. Discharge Summary sent to facility. RN given number for report [phone]. DC packet on chart. Ambulance transport requested for patient.   CSW signing off.   Judd Lien, [place] [phone]    Final next level of care: Hospice Medical Facility Barriers to Discharge: Barriers Resolved   Patient Goals and CMS Choice      Discharge Placement                Patient chooses bed at:  [facility]) Patient to be transferred to facility by: [facility] Name of family member notified: Greggory Stallion Patient and family notified of of transfer: 10/26/23  Discharge Plan and Services Additional resources added to the After Visit Summary for     Discharge Planning Services: CM Consult                                 Social Determinants of Health (SDOH) Interventions SDOH Screenings   Food Insecurity: No Food Insecurity (10/12/2023)  Housing: Medium Risk (10/12/2023)  Transportation Needs: No Transportation Needs (10/12/2023)  Utilities: Not At Risk (10/12/2023)  Financial Resource Strain: Low Risk  (05/28/2023)   Received from [facility] [place]  Tobacco Use: Medium Risk (10/12/2023)     Readmission Risk Interventions     No data to display

## 2023-10-26 NOTE — Progress Notes (Signed)
PT Cancellation Note  Patient Details Name: Christopher [name] MRN: [id number] DOB: 1966/11/12   Cancelled Treatment:     Pt for transfer to hospice.   [name] [facility] 10/26/2023, 8:33 AM Skip Mayer PT Acute [employer] [phone]

## 2023-10-26 NOTE — Discharge Summary (Signed)
Discharge Summary    Patient ID: Christopher [name] MRN: [id number]; DOB: 1966-04-14  Admit date: 10/12/2023 Discharge date: 10/26/2023  PCP:  Kirby Funk, MD (Inactive)   [facility] Providers Cardiologist:  None  Advanced Heart Failure:  Romie Minus, MD       Discharge Diagnoses    Principal Problem:   Cardiogenic shock [facility]) Active Problems:   Acute on chronic systolic heart failure (HCC)   AKI (acute kidney injury) (HCC)    Diagnostic Studies/Procedures    R HEART CATH: 10/18/2023 HEMODYNAMICS: RA:                  17 mmHg (mean) RV:                  50/5-15 mmHg PA:                  55/28 mmHg (39 mean) PCWP:            19 mmHg (mean)                                      Estimated Fick CO/CI   4.4 L/min, 2 L/min/m2 Thermodilution CO/CI   4.1 L/min, 1.8 L/min/m2                                              TPG                 20  mmHg                                            PVR                 4.5-4.8 Wood Units  PAPi                1.5       IMPRESSION: Moderate to severely elevated pre and post capillary filling pressures.  Hemodynamics consistent with suboptimal RV function.  Severely cardiac output / index.    Christopher [name] Advanced Heart Failure Mechanical Circulatory Support  ECHO: 10/13/2023  1. Left ventricular ejection fraction, by estimation, is 20 to 25%. Left  ventricular ejection fraction by 2D MOD biplane is 23.1 %. The left  ventricle has severely decreased function. The left ventricle demonstrates  global hypokinesis. The left  ventricular internal cavity size was severely dilated. Indeterminate  diastolic filling due to E-A fusion.   2. Right ventricular systolic function is moderately reduced. The right  ventricular size is severely enlarged. There is moderately elevated  pulmonary artery systolic pressure. The estimated right ventricular  systolic pressure is 49.8 mmHg.   3. Left atrial size was severely  dilated.   4. Right atrial size was severely dilated.   5. The mitral valve is abnormal. Severe mitral valve regurgitation. No  evidence of mitral stenosis. The mean mitral valve gradient is 3.0 mmHg  with average heart rate of 96 bpm.   6. The tricuspid valve is abnormal. Tricuspid valve regurgitation is  severe.   7. The aortic valve is tricuspid. Aortic valve regurgitation is mild. No  aortic stenosis is present.  8. The inferior vena cava is dilated in size with <50% respiratory  variability, suggesting right atrial pressure of 15 mmHg.   Comparison(s): No significant change from prior study.   _____________   History of Present Illness     Christopher [name] is a 57 y.o. 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   Hospital Course     Consultants: Dr Elwyn Lade w/ Adv Heart Failure, Lorinda Creed, NP w/ Palliative Care   1. Acute on chronic systolic HF -> Cardiogenic shock: Patient presented in cardiogenic shock, hypotensive with mildly elevated lactate.  He was placed on norepinephrine and subsequently dobutamine with improvement in his lactate.  His Coox was 35% which gives a cardiac index of 1.5 by assumed Fick.   - Longstanding NICM EF < 20%. Normal cors by cath 2012 with EF 20% PAH in setting of ETOH abuse - Echo 2020: EF 20% RV moderately HK severe MR/TR - RHC 11/1 with R>L failure RA 17 PA 55/28 (39) PCWP 18 Thermo 4.1/1.8 PAPi 1.5  - Failed NE wean and moved back to ICU 11/2 for NE support.NE stopped on 11/5 - Remains DBA 7.5 Co-ox 43%   - CVP 20 - Because he is for transfer to [facility], Lasix drip 30 mg per hour has been stopped, may need to continue metolazone 5 mg twice a day.  - He has end-stage CM with severe biventricular failure (R>L). Now inotrope dependent. We will continue the Dobutamine for  pressure support until he gets to [facility], then d/c it as they do not manage IV meds.  - Poor candidate for advanced therapies with ongoing heavy alcohol use, biventricular failure and potential cirrhosis - Considered cMRI to further evaluate but doubt this will change management so it was not done - Consulted Palliative Care. Now DNR DNI  -Continue dobutamine drip until he gets to [facility].   2. AKI on CKD: Baseline likely around 1.6-1.9 based on outside labs, elevated to 3.7 on arrival.   - Likely secondary to cardiorenal syndrome/shock - 2.35>2.6>2.56>2.38>3.84   3. Alcohol abuse: Drinks at least a pint of liquor every day, has not gone without alcohol in many years.     4. ?Cirrhosis: Based on soft BP, years of heavy alcohol use, and evidence of synthetic dysfunction (platelets, elevated bilirubin) is likely a possibility. PT-INR 18/1.5   5. Hyponatremia - poor prognostic marker - limit FW - BMET stat, 124 yesterday, K= 120 today   6. GOC-  Palliative Care has been following. DNR/DNI Patient to transfer to [facility] today. Dr. Shirlee Latch discussed this with the patient and his family. Nursing and CSW worked to arrange everything.   _____________  Discharge Vitals Blood pressure 92/68, pulse 87, temperature 98.1 F (36.7 C), temperature source Oral, resp. rate 17, height 5\' 11"  (1.803 m), weight 99.7 kg, SpO2 96%.  Filed Weights   10/25/23 0426 10/25/23 1510 10/26/23 0500  Weight: 98.6 kg 99.3 kg 99.7 kg    Labs & Radiologic Studies    CBC No results for input(s): "WBC", "NEUTROABS", "HGB", "HCT", "MCV", "PLT" in the last 72 hours. Basic Metabolic Panel Recent Labs    91/47/82 0341 10/24/23 1500 10/25/23 1410 10/26/23 0333  NA 123*   < > 122* 120*  K 3.8   < > 3.4* 4.9  CL 87*   < > 83* 82*  CO2 20*   < > 25 20*  GLUCOSE 169*   < > 149* 146*  BUN 52*   < > 56* 61*  CREATININE 2.57*   < > 2.70* 3.84*  CALCIUM 8.8*   < > 8.6* 9.2  MG 2.3  --  2.9*  --     < > = values in this interval not displayed.   Liver Function Tests No results for input(s): "AST", "ALT", "ALKPHOS", "BILITOT", "PROT", "ALBUMIN" in the last 72 hours. No results for input(s): "LIPASE", "AMYLASE" in the last 72 hours. High Sensitivity Troponin:   Recent Labs  Lab 10/12/23 0507 10/12/23 0652  TROPONINIHS 16 13    BNP Invalid input(s): "POCBNP" D-Dimer No results for input(s): "DDIMER" in the last 72 hours. Hemoglobin A1C No results for input(s): "HGBA1C" in the last 72 hours. Fasting Lipid Panel No results for input(s): "CHOL", "HDL", "LDLCALC", "TRIG", "CHOLHDL", "LDLDIRECT" in the last 72 hours. Thyroid Function Tests No results for input(s): "TSH", "T4TOTAL", "T3FREE", "THYROIDAB" in the last 72 hours.  Invalid input(s): "FREET3" _____________  DG Chest 1 View  Result Date: 10/26/2023 CLINICAL DATA:  Central line placement. EXAM: CHEST  1 VIEW COMPARISON:  10/20/2023. FINDINGS: Heart is enlarged and the mediastinal contour is stable. The pulmonary vasculature is distended. A right PICC line terminates over the superior vena cava. Hazy opacity is noted at the right lung base. There is limited evaluation of the costophrenic angles due to limited field of view. No pneumothorax is seen. No acute osseous abnormality. IMPRESSION: 1. Right PICC line terminates over the superior vena cava. No pneumothorax is seen. 2. Cardiomegaly with pulmonary vascular congestion. 3. Small right pleural effusion with atelectasis or infiltrate. Electronically Signed   By: Thornell Sartorius M.D.   On: 10/26/2023 00:57   [place] EKG SITE RITE  Result Date: 10/20/2023 If Site Rite image not attached, placement could not be confirmed due to current cardiac rhythm.  DG CHEST PORT 1 VIEW  Result Date: 10/20/2023 CLINICAL DATA:  Malfunctioning PICC line. EXAM: PORTABLE CHEST 1 VIEW COMPARISON:  10/16/2023 FINDINGS: The cardio pericardial silhouette is enlarged. Retrocardiac density may reflect  hiatal hernia. Right base collapse/consolidation with effusion is similar to prior. Right PICC line tip overlies the mid to distal SVC level. Telemetry leads overlie the chest. IMPRESSION: 1. Right PICC line tip overlies the mid to distal SVC level. No evidence for a kink within the visualized portion of the catheter. 2. No change in right base collapse/consolidation with effusion. Electronically Signed   By: Kennith Center M.D.   On: 10/20/2023 08:58   CARDIAC CATHETERIZATION  Result Date: 10/18/2023 HEMODYNAMICS: RA:   17 mmHg (mean) RV:   50/5-15 mmHg PA:   55/28 mmHg (39 mean) PCWP:  19 mmHg (mean)    Estimated Fick CO/CI   4.4 L/min, 2 L/min/m2 Thermodilution CO/CI   4.1 L/min, 1.8 L/min/m2    TPG    20  mmHg     PVR     4.5-4.8 Wood Units PAPi      1.5  IMPRESSION: Moderate to severely elevated pre and post capillary filling pressures. Hemodynamics consistent with suboptimal RV function. Severely cardiac output / index. Christopher [name] Advanced Heart Failure Mechanical Circulatory Support 1:12 PM  DG CHEST PORT 1 VIEW  Result Date: 10/16/2023 CLINICAL DATA:  Heart failure.  Evaluate heart and lungs. EXAM: PORTABLE CHEST 1 VIEW COMPARISON:  AP chest 10/13/2023, chest two views 09/04/2018 FINDINGS: Interval advancement of right upper extremity PICC with  tip overlying the mid to central superior vena cava. Cardiac silhouette is again moderately enlarged. Mediastinal contours are within normal limits. There is again a right pleural effusion tracking up the lateral right pleura and right major fissure. Mild right basilar heterogeneous airspace opacity is similar to prior. There is again cephalization of the pulmonary vasculature as can be seen with pulmonary venous hypertension. Mild bilateral interstitial thickening is unchanged. Possible trace left pleural fluid. No pneumothorax. No acute skeletal abnormality. IMPRESSION: 1. Interval advancement of right upper extremity PICC with tip overlying the mid to  central superior vena cava. 2. Unchanged right pleural effusion and mild right basilar heterogeneous airspace opacity, possible atelectasis. 3. Unchanged cephalization of the pulmonary vasculature as can be seen with pulmonary venous hypertension. Possible minimal interstitial pulmonary edema, unchanged. Electronically Signed   By: Neita Garnet M.D.   On: 10/16/2023 13:35   US Abdomen Limited RUQ (LIVER/GB)  Result Date: 10/15/2023 CLINICAL DATA:  Acute liver failure EXAM: ULTRASOUND ABDOMEN LIMITED RIGHT UPPER QUADRANT COMPARISON:  None Available. FINDINGS: Gallbladder: Cholelithiasis measuring 11 mm in the body. Shadowing echogenicity throughout the gallbladder. No sonographic Murphy sign noted by sonographer. Common bile duct: Diameter: 2 mm. Liver: Nodular hepatic contour. Portal vein is patent on color Doppler imaging with normal direction of blood flow towards the liver. Other: Small volume perihepatic ascites. Partially imaged right pleural effusion. IMPRESSION: 1. Nodular hepatic contour, likely reflecting cirrhosis. 2. Small volume ascites.  Partially imaged right pleural effusion. 3. Cholelithiasis. Densely shadowing echogenicities throughout the gallbladder, likely additional stones, less likely mural calcifications. Electronically Signed   By: Agustin Cree M.D.   On: 10/15/2023 09:51   DG Chest Port 1 View  Result Date: 10/13/2023 CLINICAL DATA:  PICC placement EXAM: PORTABLE CHEST 1 VIEW COMPARISON:  10/12/2023 FINDINGS: Interval placement of right upper extremity PICC, tip over the right brachiocephalic vein. Cardiomegaly. Layering right pleural effusion. Mild diffuse interstitial opacity. No acute osseous findings. IMPRESSION: 1. Interval placement of right upper extremity PICC, tip over the right brachiocephalic vein. 2. Cardiomegaly with layering right pleural effusion and mild diffuse interstitial opacity, consistent with edema. No focal airspace opacity. Electronically Signed   By: Jearld Lesch M.D.   On: 10/13/2023 19:24   [place] EKG SITE RITE  Result Date: 10/13/2023 If Site Rite image not attached, placement could not be confirmed due to current cardiac rhythm.  ECHOCARDIOGRAM COMPLETE  Result Date: 10/13/2023    ECHOCARDIOGRAM REPORT   Patient Name:   Christopher [name] Date of Exam: 10/13/2023 Medical Rec #:  [id number]      Height:       70.2 in Accession #:    [id number]     Weight:       237.6 lb Date of Birth:  1966-08-08      BSA:          2.251 m Patient Age:    57 years       BP:           85/74 mmHg Patient Gender: M              HR:           97 bpm. Exam Location:  Inpatient Procedure: 2D Echo, Color Doppler and Cardiac Doppler Indications:    I50.9* Heart failure (unspecified)  History:        Patient has prior history of Echocardiogram examinations, most  recent 06/09/2019. CHF; Risk Factors:Hypertension.  Sonographer:    Irving Burton Senior RDCS Referring Phys: [phone] STEPHANIE M REESE IMPRESSIONS  1. Left ventricular ejection fraction, by estimation, is 20 to 25%. Left ventricular ejection fraction by 2D MOD biplane is 23.1 %. The left ventricle has severely decreased function. The left ventricle demonstrates global hypokinesis. The left ventricular internal cavity size was severely dilated. Indeterminate diastolic filling due to E-A fusion.  2. Right ventricular systolic function is moderately reduced. The right ventricular size is severely enlarged. There is moderately elevated pulmonary artery systolic pressure. The estimated right ventricular systolic pressure is 49.8 mmHg.  3. Left atrial size was severely dilated.  4. Right atrial size was severely dilated.  5. The mitral valve is abnormal. Severe mitral valve regurgitation. No evidence of mitral stenosis. The mean mitral valve gradient is 3.0 mmHg with average heart rate of 96 bpm.  6. The tricuspid valve is abnormal. Tricuspid valve regurgitation is severe.  7. The aortic valve is tricuspid. Aortic valve  regurgitation is mild. No aortic stenosis is present.  8. The inferior vena cava is dilated in size with <50% respiratory variability, suggesting right atrial pressure of 15 mmHg. Comparison(s): No significant change from prior study. FINDINGS  Left Ventricle: Left ventricular ejection fraction, by estimation, is 20 to 25%. Left ventricular ejection fraction by 2D MOD biplane is 23.1 %. The left ventricle has severely decreased function. The left ventricle demonstrates global hypokinesis. The left ventricular internal cavity size was severely dilated. There is no left ventricular hypertrophy. Indeterminate diastolic filling due to E-A fusion. Right Ventricle: The right ventricular size is severely enlarged. No increase in right ventricular wall thickness. Right ventricular systolic function is moderately reduced. There is moderately elevated pulmonary artery systolic pressure. The tricuspid regurgitant velocity is 2.95 m/s, and with an assumed right atrial pressure of 15 mmHg, the estimated right ventricular systolic pressure is 49.8 mmHg. Left Atrium: Left atrial size was severely dilated. Right Atrium: Right atrial size was severely dilated. Pericardium: Trivial pericardial effusion is present. Mitral Valve: The mitral valve is abnormal. Severe mitral valve regurgitation. No evidence of mitral valve stenosis. MV peak gradient, 7.2 mmHg. The mean mitral valve gradient is 3.0 mmHg with average heart rate of 96 bpm. Tricuspid Valve: The tricuspid valve is abnormal. Tricuspid valve regurgitation is severe. The flow in the hepatic veins is reversed during ventricular systole. Aortic Valve: The aortic valve is tricuspid. Aortic valve regurgitation is mild. No aortic stenosis is present. Pulmonic Valve: The pulmonic valve was grossly normal. Pulmonic valve regurgitation is trivial. No evidence of pulmonic stenosis. Aorta: The aortic root and ascending aorta are structurally normal, with no evidence of dilitation. Venous:  The inferior vena cava is dilated in size with less than 50% respiratory variability, suggesting right atrial pressure of 15 mmHg. The inferior vena cava and the hepatic vein show a pattern of systolic flow reversal, suggestive of tricuspid regurgitation. IAS/Shunts: No atrial level shunt detected by color flow Doppler.  LEFT VENTRICLE PLAX 2D                        Biplane EF (MOD) LVIDd:         7.70 cm         LV Biplane EF:   Left LVIDs:         6.90 cm  ventricular LV PW:         0.90 cm                          ejection LV IVS:        0.80 cm                          fraction by LVOT diam:     2.40 cm                          2D MOD LV SV:         63                               biplane is LV SV Index:   28                               23.1 %. LVOT Area:     4.52 cm                                Diastology                                LV e' medial:    7.72 cm/s LV Volumes (MOD)               LV E/e' medial:  17.0 LV vol d, MOD    350.0 ml      LV e' lateral:   11.50 cm/s A2C:                           LV E/e' lateral: 11.4 LV vol d, MOD    244.0 ml A4C: LV vol s, MOD    274.0 ml A2C: LV vol s, MOD    180.0 ml A4C: LV SV MOD A2C:   76.0 ml LV SV MOD A4C:   244.0 ml LV SV MOD BP:    68.7 ml RIGHT VENTRICLE RV S prime:     9.25 cm/s TAPSE (M-mode): 1.9 cm LEFT ATRIUM              Index        RIGHT ATRIUM           Index LA diam:        5.90 cm  2.62 cm/m   RA Area:     31.50 cm LA Vol (A2C):   129.0 ml 57.30 ml/m  RA Volume:   111.00 ml 49.30 ml/m LA Vol (A4C):   83.6 ml  37.13 ml/m LA Biplane Vol: 104.0 ml 46.19 ml/m  AORTIC VALVE LVOT Vmax:   95.60 cm/s LVOT Vmean:  60.750 cm/s LVOT VTI:    0.140 m  AORTA Ao Root diam: 3.20 cm Ao Asc diam:  3.20 cm MITRAL VALVE                  TRICUSPID VALVE MV Area (PHT): 3.83 cm       TR Peak grad:   34.8 mmHg MV Area VTI:   2.32 cm       TR Vmax:  295.00 cm/s MV Peak grad:  7.2 mmHg MV Mean grad:  3.0 mmHg       SHUNTS MV Vmax:        1.34 m/s       Systemic VTI:  0.14 m MV Vmean:      85.2 cm/s      Systemic Diam: 2.40 cm MV Decel Time: 198 msec MR Peak grad:    63.4 mmHg MR Mean grad:    39.0 mmHg MR Vmax:         398.00 cm/s MR Vmean:        296.0 cm/s MR PISA:         4.02 cm MR PISA Eff ROA: 40 mm MR PISA Radius:  0.80 cm MV E velocity: 131.00 cm/s MV A velocity: 35.30 cm/s MV E/A ratio:  3.71 Lennie Odor MD Electronically signed by Lennie Odor MD Signature Date/Time: 10/13/2023/9:51:25 AM    Final    [place] EKG SITE RITE  Result Date: 10/12/2023 If Site Rite image not attached, placement could not be confirmed due to current cardiac rhythm.  DG Chest Portable 1 View  Result Date: 10/12/2023 CLINICAL DATA:  57 year old male with history of shortness of breath. EXAM: PORTABLE CHEST 1 VIEW COMPARISON:  Chest x-ray 09/04/2018. FINDINGS: Lung volumes are normal. Small right pleural effusion. Opacity at the right base which may reflect atelectasis and/or consolidation. Left lung is clear. No left pleural effusion. No pneumothorax. Cephalization of the pulmonary vasculature, without frank pulmonary edema. Heart size is moderately enlarged. The patient is rotated to the right on today's exam, resulting in distortion of the mediastinal contours and reduced diagnostic sensitivity and specificity for mediastinal pathology. IMPRESSION: 1. Atelectasis and/or consolidation in the right lower lobe with small right pleural effusion. 2. Cardiomegaly with pulmonary venous congestion, but no frank pulmonary edema. Electronically Signed   By: Trudie Reed M.D.   On: 10/12/2023 06:07   Disposition   Pt is being discharged home today in good condition.  Follow-up Plans & Appointments     Discharge Instructions     Diet - low sodium heart healthy   Complete by: As directed    Increase activity slowly   Complete by: As directed    No wound care   Complete by: As directed         Discharge Medications   Allergies as of  10/26/2023   No Known Allergies      Medication List     STOP taking these medications    allopurinol 300 MG tablet Commonly known as: ZYLOPRIM   aspirin EC 81 MG tablet   carvedilol 25 MG tablet Commonly known as: COREG   Entresto 97-103 MG Generic drug: sacubitril-valsartan   folic acid 1 MG tablet Commonly known as: FOLVITE   potassium chloride 10 MEQ tablet Commonly known as: KLOR-CON M   spironolactone 25 MG tablet Commonly known as: ALDACTONE   tamsulosin 0.4 MG Caps capsule Commonly known as: FLOMAX   torsemide 20 MG tablet Commonly known as: DEMADEX       TAKE these medications    benzonatate 200 MG capsule Commonly known as: TESSALON Take 1 capsule (200 mg total) by mouth 3 (three) times daily as needed for cough.   DOBUTamine 4-5 MG/ML-% infusion Commonly known as: DOBUTREX Inject 808.5 mcg/min into the vein continuous. To be stopped after arrival at [facility]   docusate sodium 100 MG capsule Commonly known as: COLACE Take 1 capsule (100 mg total) by  mouth 2 (two) times daily as needed for mild constipation.   feeding supplement Liqd Take 237 mLs by mouth 2 (two) times daily between meals.   furosemide 80 MG tablet Commonly known as: Lasix Take 1 tablet (80 mg total) by mouth 2 (two) times daily.           Outstanding Labs/Studies   None  Duration of Discharge Encounter   Greater than 30 minutes including physician time.  Signed, Theodore Demark, PA-C 10/26/2023, 12:11 PM

## 2023-10-26 NOTE — Progress Notes (Signed)
Patient ID: Christopher [name], male   DOB: 06-Dec-1966, 57 y.o.   MRN: [id number]    Progress Note from the Palliative Medicine Team at [facility]  Patient Name: Christopher [name]        Date: 10/26/2023 DOB: 07-21-1966  Age: 57 y.o. MRN#: [id number] Attending Physician: Dorthula Nettles, DO Primary Care Physician: Kirby Funk, MD (Inactive) Admit Date: 10/12/2023  Reason for Consultation/Follow-up: Establishing Goals of Care, Psychosocial support  HPI/ Brief Hospital Review 57 y.o. male admitted on 10/12/2023 with gradually worsening SOB x1 month.    Admitted with cardiogenic shock. S/p RHC 11/1. 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   Patient faces treatment option decisions, advanced directive decisions and anticipatory care needs.Extensive chart review has been completed prior to meeting with patient/family  including labs, vital signs, imaging, progress/consult notes, orders, medications and available advance directive documents.   Subjective This PA assessed patient at the bedside as a follow up for palliative medicine needs and emotional support. Medical records reviewed extensively in preparation for bedside visit, including progress notes, labs, imaging, and communication with PMT colleagues Lorinda Creed NP and Theodosia Paling.  Patient is sitting up in bed and denies pain, dyspnea, or distress. He does have a cough. He continues to state that he is "ready to go home" and confirms he is at peace with upcoming transfer to [facility]. Patient requests something "put in his drink to take him out." Counseled on step-wise process of addressing his symptoms at EOL, clarifying that his natural death and disease course cannot be expedited. He verbalized his understanding. Encouraged him to  reach out with symptom management needs while he awaits transfer to hospice facility.   His cousin is present visiting and shares they have received confirmation from [facility] hospital liaison that patient has a bed available today. Discussed their questions regarding patient's account at the [employer], advising patient and family discuss further with hospice social workers and the office when it re-opens.  Questions and concerns addressed. PMT will continue to follow and support holistically.  Physical Exam: General: ill-appearing AA male in NAD Cardiac: normal rate Respiratory: normal effort on room air Neuro: alert and oriented x3, at baseline Psych: normal mood and behavior Skin: warm and dry  Assessment: Goals of care conversation Cardiogenic shock AKI on CKD, worsening Cardiorenal syndrome Hyponatremia, worsening  Plan: -Continue DNR/DNI -Continue current care plan for now -Patient is currently comfortable. He is agreeable to discharge to [facility] for end-of-life care today -Psychosocial and emotional support provided -PMT remains available as needed   Total time: I spent 35 minutes in the care of the patient today in the above activities and documenting the encounter.   Richardson Dopp, PA-C Palliative Medicine Team Team phone # [phone]  Thank you for allowing the Palliative Medicine Team to assist in the care of this patient. Please utilize secure chat with additional questions, if there is no response within 30 minutes please call the above phone number.  Palliative Medicine Team providers are available by phone from 7am to 7pm daily and can be reached through the team cell phone.  Should this patient require assistance outside of these hours, please call the patient's attending physician.  Portions of this note are a verbal dictation therefore any spelling and/or grammatical errors are due to the "[employer]" system interpretation.

## 2023-10-28 ENCOUNTER — Other Ambulatory Visit (HOSPITAL_COMMUNITY): Payer: Self-pay

## 2023-11-17 DEATH — deceased

## 2023-11-18 ENCOUNTER — Other Ambulatory Visit (HOSPITAL_COMMUNITY): Payer: Self-pay
